# Patient Record
Sex: Female | Born: 1970 | Race: White | Hispanic: No | Marital: Married | Smoking: Never smoker
Health system: Southern US, Community
[De-identification: ages and names within clinical notes are randomized; demographics above are authoritative.]

## PROBLEM LIST (undated history)

## (undated) DIAGNOSIS — F329 Major depressive disorder, single episode, unspecified: Secondary | ICD-10-CM

## (undated) DIAGNOSIS — R519 Headache, unspecified: Secondary | ICD-10-CM

## (undated) DIAGNOSIS — Z87442 Personal history of urinary calculi: Secondary | ICD-10-CM

## (undated) DIAGNOSIS — K0889 Other specified disorders of teeth and supporting structures: Secondary | ICD-10-CM

## (undated) DIAGNOSIS — B029 Zoster without complications: Secondary | ICD-10-CM

## (undated) DIAGNOSIS — S4990XA Unspecified injury of shoulder and upper arm, unspecified arm, initial encounter: Secondary | ICD-10-CM

## (undated) DIAGNOSIS — Z9889 Other specified postprocedural states: Secondary | ICD-10-CM

## (undated) DIAGNOSIS — E039 Hypothyroidism, unspecified: Secondary | ICD-10-CM

## (undated) DIAGNOSIS — A4902 Methicillin resistant Staphylococcus aureus infection, unspecified site: Secondary | ICD-10-CM

## (undated) DIAGNOSIS — K859 Acute pancreatitis without necrosis or infection, unspecified: Secondary | ICD-10-CM

## (undated) DIAGNOSIS — I2699 Other pulmonary embolism without acute cor pulmonale: Secondary | ICD-10-CM

## (undated) DIAGNOSIS — G2581 Restless legs syndrome: Secondary | ICD-10-CM

## (undated) DIAGNOSIS — F439 Reaction to severe stress, unspecified: Secondary | ICD-10-CM

## (undated) DIAGNOSIS — D6851 Activated protein C resistance: Secondary | ICD-10-CM

## (undated) DIAGNOSIS — F32A Depression, unspecified: Secondary | ICD-10-CM

## (undated) DIAGNOSIS — G8929 Other chronic pain: Secondary | ICD-10-CM

## (undated) DIAGNOSIS — F419 Anxiety disorder, unspecified: Secondary | ICD-10-CM

## (undated) DIAGNOSIS — R112 Nausea with vomiting, unspecified: Secondary | ICD-10-CM

## (undated) DIAGNOSIS — R5383 Other fatigue: Secondary | ICD-10-CM

## (undated) DIAGNOSIS — R51 Headache: Secondary | ICD-10-CM

## (undated) DIAGNOSIS — R635 Abnormal weight gain: Secondary | ICD-10-CM

## (undated) HISTORY — DX: Major depressive disorder, single episode, unspecified: F32.9

## (undated) HISTORY — DX: Other fatigue: R53.83

## (undated) HISTORY — DX: Headache, unspecified: R51.9

## (undated) HISTORY — DX: Activated protein C resistance: D68.51

## (undated) HISTORY — DX: Other chronic pain: G89.29

## (undated) HISTORY — DX: Zoster without complications: B02.9

## (undated) HISTORY — DX: Unspecified injury of shoulder and upper arm, unspecified arm, initial encounter: S49.90XA

## (undated) HISTORY — DX: Hypothyroidism, unspecified: E03.9

## (undated) HISTORY — DX: Restless legs syndrome: G25.81

## (undated) HISTORY — DX: Other specified disorders of teeth and supporting structures: K08.89

## (undated) HISTORY — DX: Other pulmonary embolism without acute cor pulmonale: I26.99

## (undated) HISTORY — DX: Depression, unspecified: F32.A

## (undated) HISTORY — DX: Headache: R51

## (undated) HISTORY — PX: OTHER SURGICAL HISTORY: SHX169

## (undated) HISTORY — DX: Reaction to severe stress, unspecified: F43.9

## (undated) HISTORY — DX: Methicillin resistant Staphylococcus aureus infection, unspecified site: A49.02

## (undated) HISTORY — DX: Abnormal weight gain: R63.5

---

## 2001-12-15 ENCOUNTER — Other Ambulatory Visit: Admission: RE | Admit: 2001-12-15 | Discharge: 2001-12-15 | Payer: Self-pay | Admitting: Obstetrics and Gynecology

## 2004-03-25 ENCOUNTER — Ambulatory Visit (HOSPITAL_COMMUNITY): Admission: RE | Admit: 2004-03-25 | Discharge: 2004-03-25 | Payer: Self-pay | Admitting: Internal Medicine

## 2004-03-25 HISTORY — PX: ESOPHAGOGASTRODUODENOSCOPY: SHX1529

## 2004-03-25 HISTORY — PX: COLONOSCOPY: SHX174

## 2004-03-26 ENCOUNTER — Ambulatory Visit (HOSPITAL_COMMUNITY): Admission: RE | Admit: 2004-03-26 | Discharge: 2004-03-26 | Payer: Self-pay | Admitting: Internal Medicine

## 2004-04-16 ENCOUNTER — Ambulatory Visit (HOSPITAL_COMMUNITY): Admission: RE | Admit: 2004-04-16 | Discharge: 2004-04-16 | Payer: Self-pay | Admitting: Internal Medicine

## 2006-11-01 HISTORY — PX: LITHOTRIPSY: SUR834

## 2007-05-02 ENCOUNTER — Ambulatory Visit (HOSPITAL_COMMUNITY): Admission: RE | Admit: 2007-05-02 | Discharge: 2007-05-02 | Payer: Self-pay | Admitting: Obstetrics & Gynecology

## 2007-07-27 ENCOUNTER — Ambulatory Visit (HOSPITAL_COMMUNITY): Admission: RE | Admit: 2007-07-27 | Discharge: 2007-07-27 | Payer: Self-pay | Admitting: Urology

## 2008-05-08 ENCOUNTER — Other Ambulatory Visit: Admission: RE | Admit: 2008-05-08 | Discharge: 2008-05-08 | Payer: Self-pay | Admitting: Obstetrics and Gynecology

## 2009-06-04 ENCOUNTER — Other Ambulatory Visit: Admission: RE | Admit: 2009-06-04 | Discharge: 2009-06-04 | Payer: Self-pay | Admitting: Obstetrics and Gynecology

## 2010-09-16 ENCOUNTER — Other Ambulatory Visit: Admission: RE | Admit: 2010-09-16 | Discharge: 2010-09-16 | Payer: Self-pay | Admitting: Obstetrics and Gynecology

## 2011-03-19 NOTE — Op Note (Signed)
NAME:  Judy Patton, Judy Patton                            ACCOUNT NO.:  [id number]   MEDICAL RECORD NO.:  [id number]                   PATIENT TYPE:  AMB   LOCATION:  DAY                                  FACILITY:  [facility]   PHYSICIAN:  R. Roetta Sessions, M.D.              DATE OF BIRTH:  Mar 01, 1971   DATE OF PROCEDURE:  03/25/2004  DATE OF DISCHARGE:                                 OPERATIVE REPORT   PROCEDURES:  Diagnostic EGD followed by colonoscopy and ileoscopy.   INDICATIONS FOR PROCEDURE:  The patient is a 40 year old lady, 3 month  history of persistent epigastric pain, nausea, intermittent dry heaves,  postprandial component more prominent.  Modest relief with a proton pump  inhibitor therapy.  Intermittent low-volume hematochezia, Hemoccult  positive.  EGD and colonoscopy now being done.  The approach has been  discussed at length.  Potential risks, benefits, and alternatives have been  reviewed, questions answered.  She is agreeable.  Please see the  documentation in medical record.   PROCEDURE NOTE:  O2 saturations, blood pressure, pulse, respirations were  monitored throughout the entire procedure.  Conscious sedation of Versed 5  mg IV, Demerol 255 mg IV in divided doses.   INSTRUMENT:  Olympus video chip system.   FINDINGS:   EGD:  Examination of the tubular esophagus revealed no mucosal abnormality.  EG junction was easily traversed.   STOMACH:  The gastric cavity was emptied, insufflated well with air.  Thorough examination of the gastric mucosa, including retroflexed view of  the proximal stomach and esophagogastric junction demonstrated no  abnormalities aside from a small hiatal hernia.  Pylorus was patent and  easily traversed.  Examination of the bulb and second portion revealed no  abnormalities.   THERAPEUTIC/DIAGNOSTIC MANEUVERS PERFORMED:  None.   The patient tolerated the procedure well and was prepared for colonoscopy.  Digital rectal exam revealed no  abnormalities or endoscopic findings.  The  prep was good.   RECTUM:  Examination of the rectal mucosa including retroflexed view in the  anal verge and __________ anal canal demonstrated minimal internal  hemorrhoids.  Otherwise, rectal mucosa appeared normal.   COLON:  Colonic mucosa was surveyed from the rectosigmoid junction through  the left transverse and right colon to the area of the appendiceal orifice,  ileocecal valve, and cecum.  These structures were well-seen and  photographed for the record.  The terminal ileum was intubated to 10 cm.  From this level, the scope was slowly withdrawn.  All previously mentioned  mucosal surfaces were again seen.  The colonic mucosa as well as the  terminal ileum mucosal appeared entirely normal.  The patient tolerated the  procedures well, was reacted in endoscopy.   IMPRESSION:  1. EGD:  Normal esophagus, small hiatal hernia, otherwise normal stomach,     D1, D2.  2. Colonoscopy findings:  Anal canal hemorrhoids, otherwise normal rectum,  colon, terminal ileum.   I suspect the patient has had benign anorectal bleeding secondary to  hemorrhoids.  The etiology of her abdominal pain not yet well-defined.  From  the office on Mar 03, 2004, CBC, sed rate, and liver profile came back  normal.   RECOMMENDATIONS:  1. We will check a serum amylase today, provide Ms. Fiorini with hemorrhoid     literature.  2. We will proceed with a gallbladder work-up in the way of right upper     quadrant ultrasound.  3. Further recommendations to follow.      ___________________________________________                                            Jonathon Bellows, M.D.   RMR/MEDQ  D:  03/25/2004  T:  03/25/2004  Job:  [id number]   cc:   Tilda Burrow, M.D.  [address] [place]  [place] [address]  Fax: [phone]
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
## 2011-08-12 LAB — URINALYSIS, ROUTINE W REFLEX MICROSCOPIC
Bilirubin Urine: NEGATIVE
Glucose, UA: NEGATIVE
Ketones, ur: NEGATIVE
Leukocytes, UA: NEGATIVE
Nitrite: NEGATIVE
Protein, ur: NEGATIVE

## 2011-08-12 LAB — CBC
MCV: 91.3
Platelets: 234
RBC: 4.2
RDW: 12.9

## 2011-08-12 LAB — BASIC METABOLIC PANEL
BUN: 6
CO2: 26
Calcium: 9
Chloride: 104
Glucose, Bld: 80
Potassium: 3.7

## 2011-08-12 LAB — URINE MICROSCOPIC-ADD ON

## 2011-09-20 ENCOUNTER — Other Ambulatory Visit (HOSPITAL_COMMUNITY)
Admission: RE | Admit: 2011-09-20 | Discharge: 2011-09-20 | Disposition: A | Discharge status: Home / Self Care | Payer: BC Managed Care – PPO | Source: Ambulatory Visit | Attending: Obstetrics and Gynecology | Admitting: Obstetrics and Gynecology

## 2011-09-20 DIAGNOSIS — Z01419 Encounter for gynecological examination (general) (routine) without abnormal findings: Secondary | ICD-10-CM | POA: Insufficient documentation

## 2012-04-19 ENCOUNTER — Other Ambulatory Visit: Payer: Self-pay | Admitting: Obstetrics and Gynecology

## 2012-04-19 DIAGNOSIS — Z139 Encounter for screening, unspecified: Secondary | ICD-10-CM

## 2012-04-25 ENCOUNTER — Ambulatory Visit (HOSPITAL_COMMUNITY)
Admission: RE | Admit: 2012-04-25 | Discharge: 2012-04-25 | Disposition: A | Discharge status: Home / Self Care | Payer: BC Managed Care – PPO | Source: Ambulatory Visit | Attending: Obstetrics and Gynecology | Admitting: Obstetrics and Gynecology

## 2012-04-25 DIAGNOSIS — Z139 Encounter for screening, unspecified: Secondary | ICD-10-CM

## 2012-04-25 DIAGNOSIS — Z1231 Encounter for screening mammogram for malignant neoplasm of breast: Secondary | ICD-10-CM | POA: Insufficient documentation

## 2012-04-27 ENCOUNTER — Other Ambulatory Visit: Payer: Self-pay | Admitting: Obstetrics and Gynecology

## 2012-04-27 DIAGNOSIS — R928 Other abnormal and inconclusive findings on diagnostic imaging of breast: Secondary | ICD-10-CM

## 2012-05-10 ENCOUNTER — Ambulatory Visit (HOSPITAL_COMMUNITY)
Admission: RE | Admit: 2012-05-10 | Discharge: 2012-05-10 | Disposition: A | Discharge status: Home / Self Care | Payer: BC Managed Care – PPO | Source: Ambulatory Visit | Attending: Obstetrics and Gynecology | Admitting: Obstetrics and Gynecology

## 2012-05-10 DIAGNOSIS — R928 Other abnormal and inconclusive findings on diagnostic imaging of breast: Secondary | ICD-10-CM | POA: Insufficient documentation

## 2012-12-01 ENCOUNTER — Other Ambulatory Visit: Payer: Self-pay | Admitting: Adult Health

## 2012-12-01 ENCOUNTER — Other Ambulatory Visit (HOSPITAL_COMMUNITY)
Admission: RE | Admit: 2012-12-01 | Discharge: 2012-12-01 | Disposition: A | Discharge status: Home / Self Care | Payer: BC Managed Care – PPO | Source: Ambulatory Visit | Attending: Obstetrics and Gynecology | Admitting: Obstetrics and Gynecology

## 2012-12-01 DIAGNOSIS — Z1151 Encounter for screening for human papillomavirus (HPV): Secondary | ICD-10-CM | POA: Insufficient documentation

## 2012-12-01 DIAGNOSIS — Z01419 Encounter for gynecological examination (general) (routine) without abnormal findings: Secondary | ICD-10-CM | POA: Insufficient documentation

## 2012-12-28 ENCOUNTER — Encounter: Payer: Self-pay | Admitting: Internal Medicine

## 2013-01-01 ENCOUNTER — Ambulatory Visit: Payer: BC Managed Care – PPO | Admitting: Gastroenterology

## 2013-01-30 ENCOUNTER — Encounter: Payer: Self-pay | Admitting: *Deleted

## 2013-01-31 ENCOUNTER — Other Ambulatory Visit: Payer: BC Managed Care – PPO

## 2013-01-31 DIAGNOSIS — E039 Hypothyroidism, unspecified: Secondary | ICD-10-CM

## 2013-02-06 ENCOUNTER — Telehealth: Payer: Self-pay | Admitting: Adult Health

## 2013-02-06 NOTE — Telephone Encounter (Signed)
Left message, TSH 1.004 which is normal.

## 2013-04-03 ENCOUNTER — Other Ambulatory Visit: Payer: Self-pay | Admitting: Adult Health

## 2013-05-30 ENCOUNTER — Telehealth: Payer: Self-pay | Admitting: Adult Health

## 2013-05-31 NOTE — Telephone Encounter (Signed)
She says her RLS is bother her in the day now,is taking 1 mg at hs can take 1 an 1/2 for several days and let me know how it is working can increase more if needed.

## 2013-06-13 ENCOUNTER — Telehealth: Payer: Self-pay | Admitting: *Deleted

## 2013-06-13 MED ORDER — ROPINIROLE HCL 2 MG PO TABS: 2.0000 mg | ORAL_TABLET | Freq: Every day | ORAL | Status: DC

## 2013-06-13 NOTE — Telephone Encounter (Signed)
Has tried 1.5 mg of requip helped some will increase to 2 mg

## 2013-07-06 ENCOUNTER — Other Ambulatory Visit: Payer: Self-pay | Admitting: Adult Health

## 2013-07-09 ENCOUNTER — Other Ambulatory Visit: Payer: Self-pay | Admitting: *Deleted

## 2013-07-10 ENCOUNTER — Other Ambulatory Visit: Payer: Self-pay | Admitting: *Deleted

## 2013-07-10 MED ORDER — LEVOTHYROXINE SODIUM 88 MCG PO TABS: ORAL_TABLET | ORAL | Status: DC

## 2013-07-23 ENCOUNTER — Encounter: Payer: BC Managed Care – PPO | Admitting: Family Medicine

## 2013-07-24 ENCOUNTER — Encounter: Payer: Self-pay | Admitting: Family Medicine

## 2013-07-24 ENCOUNTER — Ambulatory Visit (INDEPENDENT_AMBULATORY_CARE_PROVIDER_SITE_OTHER): Payer: BC Managed Care – PPO | Admitting: Family Medicine

## 2013-07-24 VITALS — BP 122/80 | Temp 98.4°F | Wt 189.2 lb

## 2013-07-24 DIAGNOSIS — R51 Headache: Secondary | ICD-10-CM

## 2013-07-24 DIAGNOSIS — G2581 Restless legs syndrome: Secondary | ICD-10-CM

## 2013-07-24 LAB — CBC WITH DIFFERENTIAL/PLATELET
Basophils Absolute: 0 10*3/uL (ref 0.0–0.1)
Basophils Relative: 0 % (ref 0–1)
Eosinophils Relative: 2 % (ref 0–5)
HCT: 38.6 % (ref 36.0–46.0)
Hemoglobin: 13.2 g/dL (ref 12.0–15.0)
MCH: 30.3 pg (ref 26.0–34.0)
MCHC: 34.2 g/dL (ref 30.0–36.0)
MCV: 88.5 fL (ref 78.0–100.0)
Monocytes Absolute: 0.8 10*3/uL (ref 0.1–1.0)
Monocytes Relative: 7 % (ref 3–12)
Neutro Abs: 8.4 10*3/uL — ABNORMAL HIGH (ref 1.7–7.7)
Platelets: 331 10*3/uL (ref 150–400)

## 2013-07-24 LAB — COMPREHENSIVE METABOLIC PANEL
ALT: 10 U/L (ref 0–35)
AST: 14 U/L (ref 0–37)
Albumin: 3.4 g/dL — ABNORMAL LOW (ref 3.5–5.2)
BUN: 8 mg/dL (ref 6–23)
Glucose, Bld: 85 mg/dL (ref 70–99)
Total Bilirubin: 0.3 mg/dL (ref 0.3–1.2)

## 2013-07-24 MED ORDER — BUTALBITAL-APAP-CAFFEINE 50-325-40 MG PO TABS: ORAL_TABLET | ORAL | Status: DC

## 2013-07-24 NOTE — Progress Notes (Signed)
  Subjective:    Patient ID: Judy Patton, female    DOB: Aug 09, 1971, 42 y.o.   MRN: [id number]  HPI Pt here to discuss 2 new problems.  RLS - started 2-3 years ago, intermittant, started in car rides, over time has been regular nights and woke her up. Walking around used to help but now it does not. Has been feeling worse and staring earlier in the evening. On requip currently, 2mg  per day. Is on celexa. Caffeien 2 cups per day.  Headaches - past few mos, seemed to be associated with period so changed ocp couple years ago and headaches improved. Sometimes blurry vision, nausea, no vomiting, sometimes hurts even to sleep. 1-2 per year. 600mg  advil takes the edge off. 2 aleve has same effect. Has not tried anything else. Drinks minimal water during the day - 4-6oz per day. Usually bitemporal. Mom has a h/o migraines.   Review of Systems per hpi     Objective:   Physical Exam  Nursing note and vitals reviewed. Constitutional: She is oriented to person, place, and time. She appears well-developed and well-nourished.  HENT:  Right Ear: External ear normal.  Left Ear: External ear normal.  Nose: Nose normal.  Mouth/Throat: Oropharynx is clear and moist. No oropharyngeal exudate.  Eyes: Conjunctivae are normal. Pupils are equal, round, and reactive to light.  Neck: Normal range of motion. Neck supple. No thyromegaly present.  Cardiovascular: Normal rate, regular rhythm and normal heart sounds.   Pulmonary/Chest: Effort normal and breath sounds normal.  Abdominal: Soft. Bowel sounds are normal. She exhibits no distension. There is no tenderness. There is no rebound.  Lymphadenopathy:    She has no cervical adenopathy.  Neurological: She is alert and oriented to person, place, and time. She has normal reflexes.  Skin: Skin is warm and dry.  Mole on central back  Psychiatric: She has a normal mood and affect. Her behavior is normal.          Assessment & Plan:  Headache(784.0) - Plan:  butalbital-acetaminophen-caffeine (FIORICET) 50-325-40 MG per tablet, CBC with Differential, Comprehensive metabolic panel, Hemoglobin A1c, Vitamin B12, Vit D  25 hydroxy (rtn osteoporosis monitoring), B. Burgdorfi Antibodies, TSH, POCT urinalysis dipstick  Restless leg syndrome - Plan: CBC with Differential, Ferritin   HA - orders as above and per avs, if no better by f/u in 2 weeks w exercise, hydration, will obtain imaging  RLS - cont requip, checking ferritin  Mole - irritates her, catches on her bra and sometimes bleeds from this. Will remove at f/u visit.   Will do Hmaint at f/u

## 2013-07-24 NOTE — Patient Instructions (Signed)
*   Increase hydration to 64 oz per day * Limit caffeine to 1 cup per day * Get some exercise, at least 20 minutes 3 times weekly  Restless Legs Syndrome Restless legs syndrome is a movement disorder. It may also be called a sensori-motor disorder.  CAUSES  No one knows what specifically causes restless legs syndrome, but it tends to run in families. It is also more common in people with low iron, in pregnancy, in people who need dialysis, and those with nerve damage (neuropathy).Some medications may make restless legs syndrome worse.Those medications include drugs to treat high blood pressure, some heart conditions, nausea, colds, allergies, and depression. SYMPTOMS Symptoms include uncomfortable sensations in the legs. These leg sensations are worse during periods of inactivity or rest. They are also worse while sitting or lying down. Individuals that have the disorder describe sensations in the legs that feel like:  Pulling.  Drawing.  Crawling.  Worming.  Boring.  Tingling.  Pins and needles.  Prickling.  Pain. The sensations are usually accompanied by an overwhelming urge to move the legs. Sudden muscle jerks may also occur. Movement provides temporary relief from the discomfort. In rare cases, the arms may also be affected. Symptoms may interfere with going to sleep (sleep onset insomnia). Restless legs syndrome may also be related to periodic limb movement disorder (PLMD). PLMD is another more common motor disorder. It also causes interrupted sleep. The symptoms from PLMD usually occur most often when you are awake. TREATMENT  Treatment for restless legs syndrome is symptomatic. This means that the symptoms are treated.   Massage and cold compresses may provide temporary relief.  Walk, stretch, or take a cold or hot bath.  Get regular exercise and a good night's sleep.  Avoid caffeine, alcohol, nicotine, and medications that can make it worse.  Do activities that  provide mental stimulation like discussions, needlework, and video games. These may be helpful if you are not able to walk or stretch. Some medications are effective in relieving the symptoms. However, many of these medications have side effects. Ask your caregiver about medications that may help your symptoms. Correcting iron deficiency may improve symptoms for some patients. Document Released: 10/08/2002 Document Revised: 01/10/2012 Document Reviewed: 01/14/2011 [facility] Patient Information 2014 [place], [place].

## 2013-07-25 LAB — VITAMIN D 25 HYDROXY (VIT D DEFICIENCY, FRACTURES): Vit D, 25-Hydroxy: 43 ng/mL (ref 30–89)

## 2013-08-01 ENCOUNTER — Telehealth: Payer: Self-pay | Admitting: *Deleted

## 2013-08-01 NOTE — Telephone Encounter (Signed)
Pt called questioning lab results. Will route to MD.

## 2013-08-02 ENCOUNTER — Other Ambulatory Visit: Payer: Self-pay | Admitting: Family Medicine

## 2013-08-02 ENCOUNTER — Telehealth: Payer: Self-pay | Admitting: *Deleted

## 2013-08-02 DIAGNOSIS — G2581 Restless legs syndrome: Secondary | ICD-10-CM

## 2013-08-02 DIAGNOSIS — M25473 Effusion, unspecified ankle: Secondary | ICD-10-CM

## 2013-08-02 DIAGNOSIS — R51 Headache: Secondary | ICD-10-CM

## 2013-08-02 NOTE — Telephone Encounter (Signed)
Noted after telephone encounter was sent to MD. Do I still need to call pt?

## 2013-08-02 NOTE — Telephone Encounter (Signed)
Pt called and left VM stating that she received a call regarding her lab work and she was returning call. Will route to MD.

## 2013-08-02 NOTE — Telephone Encounter (Signed)
Pt returned your call.  She wanted to discuss the lab work she had done.  Please call her

## 2013-08-02 NOTE — Telephone Encounter (Signed)
Spoke with patient. Discussed labs. Have placed future labs that she will get drawn at her convenience at the OP lab. Will f/u at her cpe appt, earlier if needed.

## 2013-08-02 NOTE — Telephone Encounter (Signed)
Called pt back and LMOM asking her to return call.  When she calls back: Labs in general look fine although white count slightly up as well as one of the percentages. We should repeat this to ensure that there is no hidden problem - I expect either stability or improvement of those numbers but want to make sure. At the same time, we can get her ferritin, as  It was put in as lab collect instead of clinic collect so we dont have the result (my error). We can do these labs when she comes in for her CPE or i can place a future lab and she can get it done at her convenience before then - either is fine, just let me know. Thanks AW

## 2013-08-07 ENCOUNTER — Other Ambulatory Visit: Payer: Self-pay | Admitting: *Deleted

## 2013-08-07 DIAGNOSIS — G2581 Restless legs syndrome: Secondary | ICD-10-CM

## 2013-08-07 DIAGNOSIS — M25473 Effusion, unspecified ankle: Secondary | ICD-10-CM

## 2013-08-07 DIAGNOSIS — R51 Headache: Secondary | ICD-10-CM

## 2013-08-08 ENCOUNTER — Telehealth: Payer: Self-pay | Admitting: *Deleted

## 2013-08-08 LAB — CBC WITH DIFFERENTIAL/PLATELET
Basophils Absolute: 0.1 10*3/uL (ref 0.0–0.1)
Basophils Relative: 1 % (ref 0–1)
HCT: 37.9 % (ref 36.0–46.0)
Hemoglobin: 12.6 g/dL (ref 12.0–15.0)
Lymphocytes Relative: 27 % (ref 12–46)
Lymphs Abs: 2.9 10*3/uL (ref 0.7–4.0)
Monocytes Absolute: 1 10*3/uL (ref 0.1–1.0)
Neutro Abs: 6.7 10*3/uL (ref 1.7–7.7)
RBC: 4.27 MIL/uL (ref 3.87–5.11)
RDW: 13.5 % (ref 11.5–15.5)
WBC: 11 10*3/uL — ABNORMAL HIGH (ref 4.0–10.5)

## 2013-08-08 LAB — FERRITIN: Ferritin: 81 ng/mL (ref 10–291)

## 2013-08-08 LAB — B. BURGDORFI ANTIBODIES: B burgdorferi Ab IgG+IgM: 0.49 {ISR}

## 2013-08-08 NOTE — Telephone Encounter (Signed)
Pt notified and appreciative.

## 2013-08-08 NOTE — Telephone Encounter (Signed)
Message copied by [facility] [facility] on Wed Aug 08, 2013  1:19 PM ------      Message from: Acey Lav      Created: Wed Aug 08, 2013  1:10 PM       Please let pt know i have received her labs back.       Her ferritin is low, which probably is making her rls worse. She should start taking an iron supplement 1-2 times daily. I suggest ferrous gluconate as it is easier on the stomach, without food if possible and with a source of vitamin C such as orange juice.       Her white blood cell count is trending toward normal - this is reassuring.       Lyme titers normal.       I'll se her at her appt next week.       Thanks AW. ------

## 2013-08-13 ENCOUNTER — Other Ambulatory Visit: Payer: Self-pay | Admitting: Family Medicine

## 2013-08-13 ENCOUNTER — Ambulatory Visit (INDEPENDENT_AMBULATORY_CARE_PROVIDER_SITE_OTHER): Payer: BC Managed Care – PPO | Admitting: Family Medicine

## 2013-08-13 VITALS — BP 130/78 | Temp 98.2°F | Ht 65.0 in | Wt 190.0 lb

## 2013-08-13 DIAGNOSIS — D239 Other benign neoplasm of skin, unspecified: Secondary | ICD-10-CM

## 2013-08-13 DIAGNOSIS — Z0000 Encounter for general adult medical examination without abnormal findings: Secondary | ICD-10-CM

## 2013-08-13 DIAGNOSIS — Z23 Encounter for immunization: Secondary | ICD-10-CM

## 2013-08-13 DIAGNOSIS — R609 Edema, unspecified: Secondary | ICD-10-CM

## 2013-08-13 DIAGNOSIS — D229 Melanocytic nevi, unspecified: Secondary | ICD-10-CM

## 2013-08-13 DIAGNOSIS — G2581 Restless legs syndrome: Secondary | ICD-10-CM

## 2013-08-13 LAB — POCT URINALYSIS DIPSTICK
Ketones, UA: NEGATIVE
Protein, UA: NEGATIVE
Spec Grav, UA: >=1.03
Urobilinogen, UA: 0.2

## 2013-08-13 NOTE — Progress Notes (Signed)
Subjective:    Patient ID: Judy Patton, female    DOB: 1971-06-21, 42 y.o.   MRN: [id number]  HPI  Judy Patton is here today for CPE and also to follow up on 3 chronic medical conditions: headaches, edema, and RLS. Additionally she would like a mole on her back removed, as it gets irritated by her brastrap and has bled a couple times.   # Health maint - pap - 11/2012, never abn, at gyn - mammo/pert FH - due in January, never abn - physician breast exam - jan 2014, OB/gyn - PSA and DRE- n/a - c-scope/pert FH - had 3 years ago, was wnl, next one at age 48 - dexa - still gets periods, no steroid use - BP screen - 130/78 - cholesterol screen - needs to be checked - fasting glucose screen - needs to be checked no strong FH, a1c 5.5 last month - chlamydia screen (if female under age 17) - n/a - tobacco - never smoker - alcohol - one glass wine per month - drugs - none ever - Hep C (born 50-1965) - n/a, no high risk - tdap (once over age 19 in place of DT) - has not had tdap - DT (every 10 years until age 56, once age 22+) - td in 14 - flu - would like mist - pneumovax - does not need yet - zostavax (if over 60) - not yet - vit D - wnl last month, drinks 1 glass milk per day and does not get much sun exposure  Headaches - improving on increased hydration. 2 "bad ones" since last visit but overall she feels they are getting better. Fioricet helps when gets bad headache. No red flag sx such as being woken up by them.   RLS - ferritin 89, ha snot started taking iron yet. On requip.   Edema - b/l LE edema, worse when spends day standing. Had slightly low albumin on cmp but says she does eat protein daily. Also c/o mild shob when asked, but thinks may be due to lack of conditioning. No cardiac hx.   Mole - has been there for severla years. OB has "watched it" but as it is at the leve of brastrap it gets irritated easily. To her knowledge it has not changed. It does not itch.   Review of  Systems Per hpi    Objective:   Physical Exam Nursing note and vitals reviewed. Constitutional: She is oriented to person, place, and time. She appears well-developed and well-nourished.  HENT:  Right Ear: External ear normal.  Left Ear: External ear normal.  Nose: Nose normal.  Mouth/Throat: Oropharynx is clear and moist. No oropharyngeal exudate.  Eyes: Conjunctivae are normal. Pupils are equal, round, and reactive to light.  Neck: Normal range of motion. Neck supple. No thyromegaly present.  Cardiovascular: Normal rate, regular rhythm and normal heart sounds.   Pulmonary/Chest: Effort normal and breath sounds normal.  Abdominal: Soft. Bowel sounds are normal. She exhibits no distension. There is no tenderness. There is no rebound.  Lymphadenopathy:    She has no cervical adenopathy.  Neurological: She is alert and oriented to person, place, and time. She has normal reflexes.  Skin: Skin is warm and dry. Mole on back 65mmx5mm, rasied and hyperpigmented. Psychiatric: She has a normal mood and affect. Her behavior is normal.  Ext - currently no edema       Assessment & Plan:   HM - future lab placed for FLP and  fasting glucose. Will also check tsh. OB has been managing TSH but may be easier for [place] to handle that for her. Tdap and flu mist today. cpe in 1 year.   Headaches - continue with current mgmt. Discussed that if HAs are not continuing to improve, she should let me know so we can obtain imaging/further studies.   RLS - she will start taking ferrous gluconate twice daily. i suspect this will help her sx. Will recheck ferritin in a couple months. Meanwhile, requip as needed.   Edema - will check urine for protein although prior UA has been fine. Combination of edema and shob is concerning so will check echo. Pt relays that mom has heart issues and thinks it is chf.   Will plant to see her back in 3 mos for chronic issues, earlier if needed.   Mole: Shave Biopsy Procedure  Note  Pre-operative Diagnosis: Suspicious lesion  Post-operative Diagnosis: same  Locations:medial back  Indications: irritation  Anesthesia: Lidocaine 2% with epinephrine with added sodium bicarbonate  Procedure Details  History of allergy to iodine: no  Patient informed of the risks (including bleeding and infection) and benefits of the  procedure and Verbal informed consent obtained.  The lesion and surrounding area were given a sterile prep using betadyne and alcohol and draped in the usual sterile fashion. A scalpel was used to shave an area of skin approximately 1cm by 1cm.  Hemostasis achieved with pressure for hemostasis. Antibiotic ointment and a sterile dressing applied.  The specimen was sent for pathologic examination. The patient tolerated the procedure well.  EBL: <1 ml  Findings: Suspect benign nevus  Condition: Stable  Complications: none.  Plan: 1. Instructed to keep the wound dry and covered for 24-48h and clean thereafter. 2. Warning signs of infection were reviewed.   3. Recommended that the patient use OTC acetaminophen as needed for pain.  4. Return in as needed time. I'll f/u path report and let her know result when I have it.

## 2013-08-13 NOTE — Patient Instructions (Signed)
Restless Legs Syndrome Restless legs syndrome is a movement disorder. It may also be called a sensori-motor disorder.  CAUSES  No one knows what specifically causes restless legs syndrome, but it tends to run in families. It is also more common in people with low iron, in pregnancy, in people who need dialysis, and those with nerve damage (neuropathy).Some medications may make restless legs syndrome worse.Those medications include drugs to treat high blood pressure, some heart conditions, nausea, colds, allergies, and depression. SYMPTOMS Symptoms include uncomfortable sensations in the legs. These leg sensations are worse during periods of inactivity or rest. They are also worse while sitting or lying down. Individuals that have the disorder describe sensations in the legs that feel like:  Pulling.  Drawing.  Crawling.  Worming.  Boring.  Tingling.  Pins and needles.  Prickling.  Pain. The sensations are usually accompanied by an overwhelming urge to move the legs. Sudden muscle jerks may also occur. Movement provides temporary relief from the discomfort. In rare cases, the arms may also be affected. Symptoms may interfere with going to sleep (sleep onset insomnia). Restless legs syndrome may also be related to periodic limb movement disorder (PLMD). PLMD is another more common motor disorder. It also causes interrupted sleep. The symptoms from PLMD usually occur most often when you are awake. TREATMENT  Treatment for restless legs syndrome is symptomatic. This means that the symptoms are treated.   Massage and cold compresses may provide temporary relief.  Walk, stretch, or take a cold or hot bath.  Get regular exercise and a good night's sleep.  Avoid caffeine, alcohol, nicotine, and medications that can make it worse.  Do activities that provide mental stimulation like discussions, needlework, and video games. These may be helpful if you are not able to walk or  stretch. Some medications are effective in relieving the symptoms. However, many of these medications have side effects. Ask your caregiver about medications that may help your symptoms. Correcting iron deficiency may improve symptoms for some patients. Document Released: 10/08/2002 Document Revised: 01/10/2012 Document Reviewed: 01/14/2011 ExitCare Patient Information 2014 [employer].  

## 2013-08-14 ENCOUNTER — Encounter: Payer: Self-pay | Admitting: Family Medicine

## 2013-08-14 ENCOUNTER — Telehealth: Payer: Self-pay | Admitting: Family Medicine

## 2013-08-14 NOTE — Telephone Encounter (Signed)
Patient made aware of

## 2013-08-14 NOTE — Telephone Encounter (Signed)
Please let pt know that her urine yesterday had no protein, which is good. As we discussed, I have placed an order for an echocardiogram. I'll let her know when i get the results.

## 2013-08-15 LAB — URINE CULTURE: Colony Count: NO GROWTH

## 2013-08-20 ENCOUNTER — Telehealth: Payer: Self-pay | Admitting: Family Medicine

## 2013-08-20 NOTE — Telephone Encounter (Signed)
Please call and let pt know that her pathology report came back and the mole on her back is a benign intradermal nevus. This means that it is a mole that is not dangerous but does effect the full skin thickness. We don't need to do anything else with it at this point but we will continue to observe it over time to make sure there are no concerning changes.

## 2013-08-21 ENCOUNTER — Telehealth: Payer: Self-pay | Admitting: *Deleted

## 2013-08-21 NOTE — Telephone Encounter (Signed)
Patient made aware of report on mole taken off back

## 2013-10-08 ENCOUNTER — Other Ambulatory Visit: Payer: Self-pay | Admitting: Adult Health

## 2013-11-13 ENCOUNTER — Ambulatory Visit (INDEPENDENT_AMBULATORY_CARE_PROVIDER_SITE_OTHER): Payer: BC Managed Care – PPO | Admitting: Family Medicine

## 2013-11-13 ENCOUNTER — Encounter: Payer: Self-pay | Admitting: Family Medicine

## 2013-11-13 VITALS — BP 120/90 | HR 72 | Temp 99.1°F | Resp 18 | Ht 64.0 in | Wt 196.4 lb

## 2013-11-13 DIAGNOSIS — R609 Edema, unspecified: Secondary | ICD-10-CM

## 2013-11-13 DIAGNOSIS — R102 Pelvic and perineal pain: Secondary | ICD-10-CM | POA: Insufficient documentation

## 2013-11-13 DIAGNOSIS — R079 Chest pain, unspecified: Secondary | ICD-10-CM

## 2013-11-13 DIAGNOSIS — J069 Acute upper respiratory infection, unspecified: Secondary | ICD-10-CM | POA: Insufficient documentation

## 2013-11-13 DIAGNOSIS — R51 Headache: Secondary | ICD-10-CM

## 2013-11-13 DIAGNOSIS — R519 Headache, unspecified: Secondary | ICD-10-CM | POA: Insufficient documentation

## 2013-11-13 DIAGNOSIS — G2581 Restless legs syndrome: Secondary | ICD-10-CM | POA: Insufficient documentation

## 2013-11-13 LAB — POCT URINALYSIS DIPSTICK
BILIRUBIN UA: NEGATIVE
Glucose, UA: NEGATIVE
Ketones, UA: NEGATIVE
Leukocytes, UA: NEGATIVE
NITRITE UA: NEGATIVE
PH UA: 6
Spec Grav, UA: >=1.03
Urobilinogen, UA: NEGATIVE

## 2013-11-13 NOTE — Patient Instructions (Signed)
Use ferrous gluconate rather than ferrous sulfate. Take it, if possible, twice daily with only vitamin C (tablet, chew, or orange juice)  Labs today  Get your echocardiogram done  Go to the ED if chest pain recurs.

## 2013-11-13 NOTE — Progress Notes (Signed)
Subjective:    Patient ID: Judy Patton, female    DOB: 1970-12-01, 43 y.o.   MRN: [id number]  HPI Patient is here today in followup ear  Restless leg syndrome seems to be improved some on the iron. She has not yet gotten her followup ferritin. Will draw this today. She says to restless leg symptoms were better when she was taking iron twice a day but this gave her a stomachache and so now she's taking it once a day. She isn't sure if she was taking ferrous sulfate or ferrous gluconate.  Headaches have improved with increase in water. She is a dull headache today that she attributes to have an upper respiratory infection.  She complains of dull suprapubic and pelvic pain radiating around to her back today. Has been on for about 2 days and is waxing and waning 4-6/10 in severity. She describes mild dysuria on and off. She does have a history of kidney stones but says this doesn't feel like that pain. Denies any vaginal discharge or dyspareunia.  She also endorses in view left-sided chest pain with left sided arm pain she does not have these pains today but they've been on and off for the last month. She hasn't mentioned it to anybody. She isn't sure if they come on with exercise. Sometimes they happen just at rest. They've been mild to 4/10 in severity. She thinks sometimes the arm pain happens after she slept on it wrong. She is a nonsmoker and we aren't sure how her cholesterol is and she hasn't gotten the test yet. No alarming family history.  Review of Systems A 12 point review of systems is negative except as per hpi.       Objective:   Physical Exam  Nursing note and vitals reviewed. Constitutional: She is oriented to person, place, and time. She appears well-developed and well-nourished.  HENT:  Right Ear: External ear normal.  Left Ear: External ear normal.  Nose: Nose normal. No ttp of sinuses Mouth/Throat: Oropharynx is clear and moist. No oropharyngeal exudate.  Eyes:  Conjunctivae are normal. Pupils are equal, round, and reactive to light.  Neck: Normal range of motion. Neck supple. No thyromegaly present.  Cardiovascular: Normal rate, regular rhythm and normal heart sounds.   Pulmonary/Chest: Effort normal and breath sounds normal.  Abdominal: Soft. Bowel sounds are normal. She exhibits no distension. Suprapubic ttp.. There is no rebound.  Lymphadenopathy:    She has no cervical adenopathy.  Neurological: She is alert and oriented to person, place, and time. She has normal reflexes.  Skin: Skin is warm and dry.  Psychiatric: She has a normal mood and affect. Her behavior is normal.        Assessment & Plan:  Judy Patton was seen today for follow-up and abdominal pain.  Diagnoses and associated orders for this visit:  Pelvic pain - POCT urinalysis dipstick - Urine culture Today he had 2+ blood so we'll follow the culture. If cultures negative and she continues to have pain we'll do a pelvic exam and probably get an ultrasound. Chest pain - EKG 12-Lead - Exercise Tolerance Test; Future His chest pain returns she's been instructed to go the emergency department for evaluation. Meanwhile will do an exercise stress test. EKG in the office looks okay. QTC slightly long at 457.   Restless leg Have asked her to check her bottle and make sure she's taking ferrous gluconate ferrous sulfate. Will recheck her ferritin today. Requip as needed. Reminded her that  ideally she should take when necessary on an empty stomach with some vitamin C twice a day. If this isn't possible she should do is close to this if possible.  Generalized headaches Continue current management. For me know if they get worse.  Edema Edema is actually better today than it was last time she was here.  She will get her echocardiogram for further evaluation as ordered last Acute upper respiratory infections of unspecified site She can increase her Flonase from 1 spray to 2 sprays per  nostril   once daily. I don't think she has a sinus infection at this time and it'll think she needs any antibiotics right now.Osseous her back in 2-4 weeks to followup on the above. As indicated she will go to emergency department if needed especially for the chest pain.

## 2013-11-14 LAB — LIPID PANEL
CHOL/HDL RATIO: 2.4 ratio
Cholesterol: 165 mg/dL (ref 0–200)
HDL: 68 mg/dL (ref >39)
LDL CALC: 67 mg/dL (ref 0–99)
Triglycerides: 148 mg/dL (ref <150)
VLDL: 30 mg/dL (ref 0–40)

## 2013-11-14 LAB — URINE CULTURE: Colony Count: 50000

## 2013-11-14 LAB — TSH: TSH: 3.446 u[IU]/mL (ref 0.350–4.500)

## 2013-11-14 LAB — FERRITIN: Ferritin: 110 ng/mL (ref 10–291)

## 2013-11-15 ENCOUNTER — Telehealth: Payer: Self-pay | Admitting: *Deleted

## 2013-11-15 NOTE — Telephone Encounter (Signed)
Pt notified and appreciative.

## 2013-11-15 NOTE — Progress Notes (Signed)
See telephone encounter.

## 2013-11-15 NOTE — Telephone Encounter (Signed)
Message copied by [facility], Louis Meckel on Thu Nov 15, 2013  2:17 PM ------      Message from: Doran Heater      Created: Thu Nov 15, 2013 10:56 AM       Please let pt know labs wnl thanks AW ------

## 2013-11-16 ENCOUNTER — Ambulatory Visit (HOSPITAL_COMMUNITY)
Admission: RE | Admit: 2013-11-16 | Discharge: 2013-11-16 | Disposition: A | Discharge status: Home / Self Care | Payer: BC Managed Care – PPO | Source: Ambulatory Visit | Attending: Family Medicine | Admitting: Family Medicine

## 2013-11-16 ENCOUNTER — Ambulatory Visit (HOSPITAL_COMMUNITY): Payer: BC Managed Care – PPO

## 2013-11-16 DIAGNOSIS — Z6833 Body mass index (BMI) 33.0-33.9, adult: Secondary | ICD-10-CM | POA: Insufficient documentation

## 2013-11-16 DIAGNOSIS — R609 Edema, unspecified: Secondary | ICD-10-CM

## 2013-11-16 DIAGNOSIS — R079 Chest pain, unspecified: Secondary | ICD-10-CM | POA: Insufficient documentation

## 2013-11-16 NOTE — Progress Notes (Signed)
*  PRELIMINARY RESULTS* Echocardiogram 2D Echocardiogram has been performed.  [place], [place] 11/16/2013, 4:13 PM

## 2013-11-22 ENCOUNTER — Ambulatory Visit (HOSPITAL_COMMUNITY)
Admission: RE | Admit: 2013-11-22 | Discharge: 2013-11-22 | Disposition: A | Discharge status: Home / Self Care | Payer: BC Managed Care – PPO | Source: Ambulatory Visit | Attending: Family Medicine | Admitting: Family Medicine

## 2013-11-22 ENCOUNTER — Encounter: Payer: Self-pay | Admitting: Cardiology

## 2013-11-22 DIAGNOSIS — R079 Chest pain, unspecified: Secondary | ICD-10-CM

## 2013-11-22 DIAGNOSIS — R0609 Other forms of dyspnea: Secondary | ICD-10-CM | POA: Insufficient documentation

## 2013-11-22 DIAGNOSIS — R0989 Other specified symptoms and signs involving the circulatory and respiratory systems: Secondary | ICD-10-CM | POA: Insufficient documentation

## 2013-11-22 NOTE — Progress Notes (Signed)
Stress Lab Nurses Notes - [facility]   Kaisa Wofford [facility]  11/22/2013  Reason for doing test: Chest Pain and Dyspnea  Type of test: Regular GTX  Nurse performing test: Gerrit Halls, RN  MD performing test: S. McDowell/K.Purcell Nails NP  Family MD: Dr. Geroge Baseman  Test explained and consent signed: Yes  IV started: No IV started  Symptoms: SOB  Treatment/Intervention: None  Reason test stopped: Fatigue  After recovery IV was: NA  Patient discharged: Home  Patient's Condition upon discharge was: Stable  Comments: During test peak BP 159/110 & HR 146. Recovery BP 108/69 & HR 89. Symptoms resolved in recovery.  Geanie Cooley T  Attending note:  Patient exercised on Bruce protocol for 6:24 reaching peak HR 151 BPM, 84% MPHR, maximum workload 8.1 METS. Stopped due to fatigue and dyspnea. No chest pain reported. Peak blood pressure 174/84 - hypertensive response. Equivocal ST changes noted with baseline ST/T abnormalities and lead motion artifact. This test just falls short of 85% heart rate response and in light of equivocal ST changes, does not exclude ischemic heart disease. Suggest consideration of an imaging study such as dobutamine echocardiogram or lexiscan cardiolite if further noninvasive evaluation of symptoms is warranted.  Satira Sark, M.D., F.A.C.C.

## 2013-11-22 NOTE — Progress Notes (Signed)
Stress Lab Nurses Notes - [facility] Na  Conchetta Lamia [facility] 11/22/2013 Reason for doing test: Chest Pain and Dyspnea Type of test: Regular GTX Nurse performing test: Gerrit Halls, RN Nuclear Medicine Tech: Not Applicable Echo Tech: Not Applicable MD performing test: S. McDowell/K.Purcell Nails NP Family MD: Dr. Geroge Baseman Test explained and consent signed: yes IV started: No IV started Symptoms: SOB Treatment/Intervention: None Reason test stopped: fatigue After recovery IV was: NA Patient to return to [place]. Med at : NA Patient discharged: Home Patient's Condition upon discharge was: stable Comments: During test peak BP 159/110 & HR 146.  Recovery BP 108/69 & HR 89.  Symptoms resolved in recovery.   Geanie Cooley T

## 2013-11-27 ENCOUNTER — Telehealth: Payer: Self-pay | Admitting: Family Medicine

## 2013-11-27 NOTE — Telephone Encounter (Signed)
Please advise 

## 2013-11-27 NOTE — Telephone Encounter (Signed)
Please call with results of ECHO & stress test  [phone]

## 2013-11-28 NOTE — Telephone Encounter (Signed)
Echo was wnl. I do not have the result of the ST yet. Thasnk AW

## 2013-11-29 ENCOUNTER — Telehealth: Payer: Self-pay | Admitting: *Deleted

## 2013-11-29 NOTE — Telephone Encounter (Signed)
Attempted to call, awaiting callback

## 2013-11-29 NOTE — Telephone Encounter (Signed)
Pt returned nurse's call, explained that Echo was wnl and we do not have results for stress test yet but when we do we will call her with those results also. Pt appreciative.

## 2013-11-29 NOTE — Telephone Encounter (Signed)
Nurse called to give results from echo. No answer, message left on cell. Home number also no answer and no VM setup.

## 2013-11-29 NOTE — Telephone Encounter (Signed)
See telephone encounter from today.

## 2013-12-11 ENCOUNTER — Ambulatory Visit (INDEPENDENT_AMBULATORY_CARE_PROVIDER_SITE_OTHER): Payer: BC Managed Care – PPO | Admitting: Family Medicine

## 2013-12-11 ENCOUNTER — Ambulatory Visit (HOSPITAL_COMMUNITY)
Admission: RE | Admit: 2013-12-11 | Discharge: 2013-12-11 | Disposition: A | Discharge status: Home / Self Care | Payer: BC Managed Care – PPO | Source: Ambulatory Visit | Attending: Family Medicine | Admitting: Family Medicine

## 2013-12-11 ENCOUNTER — Encounter: Payer: Self-pay | Admitting: Family Medicine

## 2013-12-11 VITALS — BP 118/76 | HR 102 | Temp 97.6°F | Resp 18 | Ht 64.0 in | Wt 196.0 lb

## 2013-12-11 DIAGNOSIS — R109 Unspecified abdominal pain: Secondary | ICD-10-CM | POA: Insufficient documentation

## 2013-12-11 DIAGNOSIS — R102 Pelvic and perineal pain: Secondary | ICD-10-CM

## 2013-12-11 DIAGNOSIS — R9389 Abnormal findings on diagnostic imaging of other specified body structures: Secondary | ICD-10-CM | POA: Insufficient documentation

## 2013-12-11 DIAGNOSIS — R319 Hematuria, unspecified: Secondary | ICD-10-CM

## 2013-12-11 DIAGNOSIS — R9439 Abnormal result of other cardiovascular function study: Secondary | ICD-10-CM

## 2013-12-11 DIAGNOSIS — G2581 Restless legs syndrome: Secondary | ICD-10-CM

## 2013-12-11 NOTE — Progress Notes (Signed)
   Subjective:    Patient ID: Judy Patton, female    DOB: 06-May-1971, 43 y.o.   MRN: [id number]  HPI  CP - improving, but still a few episodes, doesn't seem to be with exertion. Had treadmill ST - reccomended lexiscan cardiolite or dobutamine. Does have some mild shob with exertion. Nl echo.  Hematuria - h/o kidney stones, s/p lithotirpsy but never passed fragments. Had negative experience at hospital and with lithjotripsy (woke up in middle). Be;ives she contracted mrsa from the hospital. ucx last vist showed dirty catch. No dysuria/frequency, occasional flank pain and some pelvic discomfort. Periods irregular in heaviness but still monthly.   RLS - improving, on ferrous gluconate. H/o mennorhagia and poor PO iron intake likely source.        Review of Systems per hpi     Objective:   Physical Exam Nursing note and vitals reviewed. Constitutional: She is oriented to person, place, and time. She appears well-developed and well-nourished.  HENT:  Right Ear: External ear normal.  Left Ear: External ear normal.  Nose: Nose normal.  Mouth/Throat: Oropharynx is clear and moist. No oropharyngeal exudate.  Eyes: Conjunctivae are normal. Pupils are equal, round, and reactive to light.  Neck: Normal range of motion. Neck supple. No thyromegaly present.  Cardiovascular: Normal rate, regular rhythm and normal heart sounds.   Pulmonary/Chest: Effort normal and breath sounds normal.  Abdominal: Soft. Bowel sounds are normal. She exhibits no distension. There is no tenderness. There is no rebound.  Lymphadenopathy:    She has no cervical adenopathy.  Neurological: She is alert and oriented to person, place, and time. She has normal reflexes.  Skin: Skin is warm and dry.  Psychiatric: She has a normal mood and affect. Her behavior is normal.         Assessment & Plan:  Zera was seen today for follow-up.  Diagnoses and associated orders for this visit:  Hematuria - Urine  culture - DG Abd 1 View; Future - if all nondiagnostic will get TVUS - if stone, refer to uro (not same [facility] in [place] she previously saw) Abnormal stress electrocardiogram test using treadmill - Ambulatory referral to Cardiology - eval for lexiscan Restless leg - cont iron Pelvic pain See above  F/u 4-6 weeks

## 2013-12-12 LAB — URINE CULTURE
COLONY COUNT: NO GROWTH
ORGANISM ID, BACTERIA: NO GROWTH

## 2013-12-19 ENCOUNTER — Telehealth: Payer: Self-pay | Admitting: *Deleted

## 2013-12-19 ENCOUNTER — Other Ambulatory Visit: Payer: Self-pay | Admitting: Family Medicine

## 2013-12-19 DIAGNOSIS — N200 Calculus of kidney: Secondary | ICD-10-CM

## 2013-12-19 NOTE — Progress Notes (Signed)
See telephone encounter.

## 2013-12-19 NOTE — Telephone Encounter (Signed)
Spoke with pt and informed of results. She will call back and give name of urologist that she seen last. She also questioned her appointment/referral to cardio. Will route

## 2013-12-19 NOTE — Telephone Encounter (Signed)
Message copied by [facility], Louis Meckel on Wed Dec 19, 2013  3:16 PM ------      Message from: Doran Heater      Created: Wed Dec 19, 2013 12:04 PM       Please let pt know xr looks like stone as we suspected. Will refer to uro - please make sure we know who she saw last time so she can see someone different this time per her request. Thanks AW ------

## 2013-12-20 ENCOUNTER — Other Ambulatory Visit: Payer: Self-pay | Admitting: Adult Health

## 2013-12-20 NOTE — Telephone Encounter (Signed)
ref'l is in EPIC, spoke with Coralyn Mark and she will contact patient.  Thanks

## 2013-12-24 ENCOUNTER — Telehealth: Payer: Self-pay | Admitting: Family Medicine

## 2013-12-24 NOTE — Telephone Encounter (Signed)
Spoke with patient about urology appt again.  The appts i have made her she doesn't want.  (Dr. [facility] in [place], she also doesn't want to see Dr. Lise Auer in [place]) I asked her to please find someone she is interested in and let me know and I would be glad to make it for her.

## 2013-12-25 ENCOUNTER — Ambulatory Visit: Payer: BC Managed Care – PPO | Admitting: Cardiology

## 2013-12-26 ENCOUNTER — Encounter: Payer: Self-pay | Admitting: Cardiology

## 2013-12-26 ENCOUNTER — Encounter (INDEPENDENT_AMBULATORY_CARE_PROVIDER_SITE_OTHER): Payer: Self-pay

## 2013-12-26 ENCOUNTER — Ambulatory Visit (INDEPENDENT_AMBULATORY_CARE_PROVIDER_SITE_OTHER): Payer: BC Managed Care – PPO | Admitting: Cardiology

## 2013-12-26 ENCOUNTER — Encounter: Payer: Self-pay | Admitting: *Deleted

## 2013-12-26 VITALS — BP 123/56 | HR 90 | Ht 65.0 in | Wt 195.0 lb

## 2013-12-26 DIAGNOSIS — R609 Edema, unspecified: Secondary | ICD-10-CM

## 2013-12-26 DIAGNOSIS — R079 Chest pain, unspecified: Secondary | ICD-10-CM

## 2013-12-26 DIAGNOSIS — I10 Essential (primary) hypertension: Secondary | ICD-10-CM

## 2013-12-26 DIAGNOSIS — R600 Localized edema: Secondary | ICD-10-CM

## 2013-12-26 MED ORDER — FUROSEMIDE 20 MG PO TABS: 20.0000 mg | ORAL_TABLET | Freq: Every day | ORAL | Status: DC | PRN

## 2013-12-26 NOTE — Progress Notes (Signed)
Clinical Summary Judy Patton is a 43 y.o.female seen today as a new patient. She is referred for chest pain  1. Chest pain - noted increased SOB over the last few months, can occur at rest but mainly with exertion. Example walking from car to work has noted some DOE. Also developed LE edema over this same time frame  - chest pain started a few months ago. Dull pain left chest, 2/10. Can occur at rest or with exertion. Occurs approx 4 times over the last month. Stable in severity, no change in frequency - referred for exercise stress test by PCP Jan 2015, equivocal ST/T changes and did not quite meet THR (reached 84% of THR) - echo Jan 2015: LVEF 60-65%, no WMAs  CAD risk factors: mother with heart disease, unclear age.   Past Medical History  Diagnosis Date  . Hypothyroidism   . Depression   . Chronic headaches   . Kidney stones   . Stress     family  . MRSA (methicillin resistant Staphylococcus aureus)   . Restless leg syndrome   . Shoulder injury      No Known Allergies   Current Outpatient Prescriptions  Medication Sig Dispense Refill  . BALZIVA 0.4-35 MG-MCG tablet TAKE ONE TABLET BY MOUTH EVERY DAY AS DIRECTED  28 tablet  10  . citalopram (CELEXA) 40 MG tablet TAKE ONE TABLET BY MOUTH ONE TIME DAILY  30 tablet  11  . levothyroxine (SYNTHROID, LEVOTHROID) 88 MCG tablet TAKE ONE TABLET BY MOUTH ONE TIME DAILY  30 tablet  2  . rOPINIRole (REQUIP) 2 MG tablet Take 1 tablet (2 mg total) by mouth at bedtime.  30 tablet  11   No current facility-administered medications for this visit.     Past Surgical History  Procedure Laterality Date  . Esophagogastroduodenoscopy  03/25/2004    NWG:NFAOZH esophagus, small hiatal hernia, otherwise normal stomach  . Colonoscopy  03/25/2004    YQM:VHQI canal hemorrhoids, otherwise normal rectum,  . Lithotripsy  2008     No Known Allergies    Family History  Problem Relation Age of Onset  . Cancer Brother     kidney  .  Cancer Paternal Grandmother     breast cancer  . Cancer Paternal Grandfather     lung cancer     Social History Ms. Lape reports that she has never smoked. She has never used smokeless tobacco. Ms. Rieves reports that she does not drink alcohol.   Review of Systems CONSTITUTIONAL: No weight loss, fever, chills, weakness or fatigue.  HEENT: Eyes: No visual loss, blurred vision, double vision or yellow sclerae.No hearing loss, sneezing, congestion, runny nose or sore throat.  SKIN: No rash or itching.  CARDIOVASCULAR: per HPI RESPIRATORY: No shortness of breath, cough or sputum.  GASTROINTESTINAL: No anorexia, nausea, vomiting or diarrhea. No abdominal pain or blood.  GENITOURINARY: No burning on urination, no polyuria NEUROLOGICAL: No headache, dizziness, syncope, paralysis, ataxia, numbness or tingling in the extremities. No change in bowel or bladder control.  MUSCULOSKELETAL: Lower extremity edema LYMPHATICS: No enlarged nodes. No history of splenectomy.  PSYCHIATRIC: No history of depression or anxiety.  ENDOCRINOLOGIC: No reports of sweating, cold or heat intolerance. No polyuria or polydipsia.  Marland Kitchen   Physical Examination p  90 bp 123/56 Wt 195 lbs BMI 32 Gen: resting comfortably, no acute distress HEENT: no scleral icterus, pupils equal round and reactive, no palptable cervical adenopathy,  CV: RRR, no m/r/g, no JVD, no  carotid bruits Resp: Clear to auscultation bilaterally GI: abdomen is soft, non-tender, non-distended, normal bowel sounds, no hepatosplenomegaly MSK: extremities are warm, no edema.  Skin: warm, no rash Neuro:  no focal deficits Psych: appropriate affect   Diagnostic Studies Nov 22, 2013 Exercise Stress Test 6 min 24 sec, 8.1 METs, 84% THR. No chest pain. Equivocal ST/T changes.   Jan 2015 Echo LVEF 60-65%, no WMAs, normal diasotlic function,   Assessment and Plan  1. Chest pain - non-diagnostic exercise stress, equivocal ST/T changes and did not  reach target heart rate - will obtain Lexiscan  2. Lower extremity edema - unclear etiology, echo without any significant pathology. Sent for BMET with normal renal function. Could be venous insufficiency vs. Compromised venous return in the setting of obesity - given prn lasix 20mg , advised decrease sodium in diet.    Follow up 1 month   Arnoldo Lenis, M.D., F.A.C.C.

## 2013-12-26 NOTE — Patient Instructions (Signed)
Your physician recommends that you schedule a follow-up appointment in: [place] has recommended you make the following change in your medication:   1) START TAKING LASIX 20MG  DAILY AS NEEDED FOR SWELLING  Your physician has requested that you have a lexiscan myoview. For further information please visit [email or web address]. Please follow instruction sheet, as given.  WE WILL CALL YOU WITH YOUR TEST RESULTS/INSTRUCTIONS/NEXT STEPS ONCE RECEIVED BY THE PROVIDER   Your physician recommends that you return for lab work in: [place]

## 2013-12-27 LAB — BASIC METABOLIC PANEL
BUN: 10 mg/dL (ref 6–23)
CO2: 24 meq/L (ref 19–32)
CREATININE: 0.53 mg/dL (ref 0.50–1.10)
Calcium: 8.9 mg/dL (ref 8.4–10.5)
Chloride: 102 mEq/L (ref 96–112)
Glucose, Bld: 76 mg/dL (ref 70–99)
POTASSIUM: 3.8 meq/L (ref 3.5–5.3)
SODIUM: 137 meq/L (ref 135–145)

## 2013-12-28 ENCOUNTER — Encounter: Payer: Self-pay | Admitting: *Deleted

## 2013-12-31 ENCOUNTER — Ambulatory Visit: Payer: BC Managed Care – PPO | Admitting: Cardiovascular Disease

## 2014-01-07 ENCOUNTER — Encounter (HOSPITAL_COMMUNITY): Payer: BC Managed Care – PPO

## 2014-01-07 ENCOUNTER — Other Ambulatory Visit (HOSPITAL_COMMUNITY): Payer: BC Managed Care – PPO

## 2014-01-17 ENCOUNTER — Encounter: Payer: Self-pay | Admitting: Cardiology

## 2014-02-04 ENCOUNTER — Ambulatory Visit: Payer: BC Managed Care – PPO | Admitting: Cardiology

## 2014-02-14 ENCOUNTER — Ambulatory Visit (HOSPITAL_COMMUNITY): Payer: BC Managed Care – PPO

## 2014-02-14 ENCOUNTER — Encounter (HOSPITAL_COMMUNITY): Payer: Self-pay

## 2014-02-14 ENCOUNTER — Encounter (HOSPITAL_COMMUNITY)
Admission: RE | Admit: 2014-02-14 | Discharge: 2014-02-14 | Disposition: A | Discharge status: Home / Self Care | Payer: BC Managed Care – PPO | Source: Ambulatory Visit | Attending: Cardiology | Admitting: Cardiology

## 2014-02-14 ENCOUNTER — Encounter (HOSPITAL_COMMUNITY): Payer: BC Managed Care – PPO

## 2014-02-14 ENCOUNTER — Other Ambulatory Visit (HOSPITAL_COMMUNITY): Payer: BC Managed Care – PPO

## 2014-02-14 DIAGNOSIS — R079 Chest pain, unspecified: Secondary | ICD-10-CM

## 2014-02-14 MED ORDER — TECHNETIUM TC 99M SESTAMIBI - CARDIOLITE
10.0000 | Freq: Once | INTRAVENOUS | Status: AC | PRN
  Administered 2014-02-14: 08:00:00 10 via INTRAVENOUS

## 2014-02-14 MED ORDER — REGADENOSON 0.4 MG/5ML IV SOLN
INTRAVENOUS | Status: AC
  Administered 2014-02-14: 0.4 mg via INTRAVENOUS
  Filled 2014-02-14: qty 5

## 2014-02-14 MED ORDER — SODIUM CHLORIDE 0.9 % IJ SOLN
INTRAMUSCULAR | Status: AC
  Administered 2014-02-14: 10 mL via INTRAVENOUS
  Filled 2014-02-14: qty 10

## 2014-02-14 MED ORDER — TECHNETIUM TC 99M SESTAMIBI GENERIC - CARDIOLITE
30.0000 | Freq: Once | INTRAVENOUS | Status: AC | PRN
  Administered 2014-02-14: 30 via INTRAVENOUS

## 2014-02-14 NOTE — Progress Notes (Signed)
Stress Lab Nurses Notes - [facility] Na  Lenni Reckner [facility] 02/14/2014 Reason for doing test: Chest Pain and Dyspnea Type of test: Wille Glaser Nurse performing test: Gerrit Halls, RN Nuclear Medicine Tech: Melburn Hake Echo Tech: Not Applicable MD performing test: S. McDowell/K.LawrenceNP Family MD: Clydene Laming Test explained and consent signed: yes IV started: 22g jelco, Saline lock flushed, No redness or edema and Saline lock started in radiology Symptoms: "felt heart beating faster" & tingling in legs Treatment/Intervention: None Reason test stopped: protocol completed After recovery IV was: Discontinued via X-ray tech and No redness or edema Patient to return to [place]. Med at :10:15 Patient discharged: Home Patient's Condition upon discharge was: stable Comments: During test BP 129/72 & HR 120.  Recovery BP 104/69 & HR 86.  Symptoms resolved in recovery.  Donnajean Lopes

## 2014-06-03 ENCOUNTER — Other Ambulatory Visit: Payer: Self-pay | Admitting: Adult Health

## 2014-07-31 ENCOUNTER — Other Ambulatory Visit: Payer: Self-pay | Admitting: Adult Health

## 2014-09-02 ENCOUNTER — Encounter (HOSPITAL_COMMUNITY): Payer: Self-pay

## 2014-09-20 ENCOUNTER — Telehealth: Payer: Self-pay | Admitting: *Deleted

## 2014-09-20 NOTE — Telephone Encounter (Signed)
I called pt but she wants to talk to you about Celexa. She has started weaning herself off of it but she has questions for you. Thanks!! [place]

## 2014-09-20 NOTE — Telephone Encounter (Signed)
Left message to cal me monday

## 2014-09-23 ENCOUNTER — Telehealth: Payer: Self-pay | Admitting: Adult Health

## 2014-09-23 MED ORDER — CITALOPRAM HYDROBROMIDE 10 MG PO TABS: ORAL_TABLET | ORAL | Status: DC

## 2014-09-23 NOTE — Telephone Encounter (Signed)
Trying to wean off celexa, do 10 mg every 2-3 days x 2 weeks then 5 mg every 2-3 days x 2 weeks

## 2014-10-24 ENCOUNTER — Other Ambulatory Visit: Payer: Self-pay | Admitting: Adult Health

## 2014-10-29 ENCOUNTER — Telehealth: Payer: Self-pay | Admitting: Adult Health

## 2014-10-29 NOTE — Telephone Encounter (Signed)
Spoke with pt letting her know Balziva refill was sent to pharmacy. [place]

## 2014-12-26 ENCOUNTER — Telehealth: Payer: Self-pay | Admitting: Adult Health

## 2014-12-26 MED ORDER — CITALOPRAM HYDROBROMIDE 10 MG PO TABS: ORAL_TABLET | ORAL | Status: DC

## 2014-12-26 NOTE — Telephone Encounter (Signed)
Refilled celexa 10 mg #30 with 2 refills, she is taking 1 on Sunday and Wednesday now and doing well will continue to wean

## 2014-12-26 NOTE — Telephone Encounter (Signed)
Spoke with pt. Pt is on Celexa 10 mg. She takes 1 tab twice a week and it's working for her. She needs a refill on med. Thanks!! [place]

## 2015-02-04 ENCOUNTER — Other Ambulatory Visit: Payer: Self-pay | Admitting: Adult Health

## 2015-02-04 DIAGNOSIS — Z1231 Encounter for screening mammogram for malignant neoplasm of breast: Secondary | ICD-10-CM

## 2015-02-12 ENCOUNTER — Other Ambulatory Visit: Payer: Self-pay | Admitting: Adult Health

## 2015-02-18 ENCOUNTER — Encounter: Payer: Self-pay | Admitting: Adult Health

## 2015-02-18 ENCOUNTER — Ambulatory Visit (INDEPENDENT_AMBULATORY_CARE_PROVIDER_SITE_OTHER): Payer: BC Managed Care – PPO | Admitting: Adult Health

## 2015-02-18 VITALS — BP 136/82 | HR 100 | Ht 65.0 in | Wt 203.5 lb

## 2015-02-18 DIAGNOSIS — Z1212 Encounter for screening for malignant neoplasm of rectum: Secondary | ICD-10-CM

## 2015-02-18 DIAGNOSIS — R635 Abnormal weight gain: Secondary | ICD-10-CM

## 2015-02-18 DIAGNOSIS — E039 Hypothyroidism, unspecified: Secondary | ICD-10-CM

## 2015-02-18 DIAGNOSIS — R5383 Other fatigue: Secondary | ICD-10-CM

## 2015-02-18 DIAGNOSIS — Z01419 Encounter for gynecological examination (general) (routine) without abnormal findings: Secondary | ICD-10-CM | POA: Diagnosis not present

## 2015-02-18 DIAGNOSIS — Z3041 Encounter for surveillance of contraceptive pills: Secondary | ICD-10-CM

## 2015-02-18 HISTORY — DX: Other fatigue: R53.83

## 2015-02-18 HISTORY — DX: Abnormal weight gain: R63.5

## 2015-02-18 LAB — HEMOCCULT GUIAC POC 1CARD (OFFICE): Fecal Occult Blood, POC: NEGATIVE

## 2015-02-18 MED ORDER — NORETHINDRONE-ETH ESTRADIOL 0.4-35 MG-MCG PO TABS: 1.0000 | ORAL_TABLET | Freq: Every day | ORAL | Status: DC

## 2015-02-18 NOTE — Progress Notes (Signed)
Patient ID: Judy Patton, female   DOB: 01/23/1971, 44 y.o.   MRN: [id number] History of Present Illness: Judy Patton is a 44 year old white female, married in for well woman gyn exam.She a normal pap with negative HPV 12/01/12.She is complaining of weight gain,tired all the time, and some swelling in ankles at times.She has some shortness of breath with exertion.And she says husband says she snores. Has had more headaches but has had bad tooth.Periods light.  Current Medications, Allergies, Past Medical History, Past Surgical History, Family History and Social History were reviewed in [employer] record.     Review of Systems: Patient denies any hearing loss, blurred vision, chest pain, abdominal pain, problems with bowel movements, urination, or intercourse. No mood swings.See HPI for positives.Has pain in knees. Is weaning off celexa.Has RLS.   Physical Exam:BP 136/82 mmHg  Pulse 100  Ht 5\' 5"  (1.651 m)  Wt 203 lb 8 oz (92.307 kg)  BMI 33.86 kg/m2  LMP 02/12/2015 General:  Well developed, well nourished, no acute distress Skin:  Warm and dry Neck:  Midline trachea, normal thyroid, good ROM, no lymphadenopathy Lungs; Clear to auscultation bilaterally Breast:  No dominant palpable mass, retraction, or nipple discharge Cardiovascular: Regular rate and rhythm Abdomen:  Soft, non tender, no hepatosplenomegaly Pelvic:  External genitalia is normal in appearance, no lesions.  The vagina is normal in appearance. Urethra has no lesions or masses. The cervix is bulbous.  Uterus is felt to be normal size, shape, and contour.  No adnexal masses or tenderness noted.Bladder is non tender, no masses felt. Rectal: Good sphincter tone, no polyps, or hemorrhoids felt.  Hemoccult negative. Extremities/musculoskeletal:  No swelling or varicosities noted, no clubbing or cyanosis Psych:  No mood changes, alert and cooperative,seems happy   Impression: Well woman gyn exam no pap Weight  gain Fatigue Hypothyroid Contraceptive management    Plan: Check CBC,CMP,TSH and lipids Refilled balziva x 1 year Will talk when labs back may get sleep study to r/o sleep apnea  Pap and physical in 1 year Mammogram yearly Has refills on requip,synthroid and celexa

## 2015-02-18 NOTE — Patient Instructions (Signed)
Pap and physical in 1 year Mammogram yearly Will talk when labs back Fatigue Fatigue is a feeling of tiredness, lack of energy, lack of motivation, or feeling tired all the time. Having enough rest, good nutrition, and reducing stress will normally reduce fatigue. Consult your caregiver if it persists. The nature of your fatigue will help your caregiver to find out its cause. The treatment is based on the cause.  CAUSES  There are many causes for fatigue. Most of the time, fatigue can be traced to one or more of your habits or routines. Most causes fit into one or more of three general areas. They are: Lifestyle problems  Sleep disturbances.  Overwork.  Physical exertion.  Unhealthy habits.  Poor eating habits or eating disorders.  Alcohol and/or drug use .  Lack of proper nutrition (malnutrition). Psychological problems  Stress and/or anxiety problems.  Depression.  Grief.  Boredom. Medical Problems or Conditions  Anemia.  Pregnancy.  Thyroid gland problems.  Recovery from major surgery.  Continuous pain.  Emphysema or asthma that is not well controlled  Allergic conditions.  Diabetes.  Infections (such as mononucleosis).  Obesity.  Sleep disorders, such as sleep apnea.  Heart failure or other heart-related problems.  Cancer.  Kidney disease.  Liver disease.  Effects of certain medicines such as antihistamines, cough and cold remedies, prescription pain medicines, heart and blood pressure medicines, drugs used for treatment of cancer, and some antidepressants. SYMPTOMS  The symptoms of fatigue include:   Lack of energy.  Lack of drive (motivation).  Drowsiness.  Feeling of indifference to the surroundings. DIAGNOSIS  The details of how you feel help guide your caregiver in finding out what is causing the fatigue. You will be asked about your present and past health condition. It is important to review all medicines that you take, including  prescription and non-prescription items. A thorough exam will be done. You will be questioned about your feelings, habits, and normal lifestyle. Your caregiver may suggest blood tests, urine tests, or other tests to look for common medical causes of fatigue.  TREATMENT  Fatigue is treated by correcting the underlying cause. For example, if you have continuous pain or depression, treating these causes will improve how you feel. Similarly, adjusting the dose of certain medicines will help in reducing fatigue.  HOME CARE INSTRUCTIONS   Try to get the required amount of good sleep every night.  Eat a healthy and nutritious diet, and drink enough water throughout the day.  Practice ways of relaxing (including yoga or meditation).  Exercise regularly.  Make plans to change situations that cause stress. Act on those plans so that stresses decrease over time. Keep your work and personal routine reasonable.  Avoid street drugs and minimize use of alcohol.  Start taking a daily multivitamin after consulting your caregiver. SEEK MEDICAL CARE IF:   You have persistent tiredness, which cannot be accounted for.  You have fever.  You have unintentional weight loss.  You have headaches.  You have disturbed sleep throughout the night.  You are feeling sad.  You have constipation.  You have dry skin.  You have gained weight.  You are taking any new or different medicines that you suspect are causing fatigue.  You are unable to sleep at night.  You develop any unusual swelling of your legs or other parts of your body. SEEK IMMEDIATE MEDICAL CARE IF:   You are feeling confused.  Your vision is blurred.  You feel faint or pass  out.  You develop severe headache.  You develop severe abdominal, pelvic, or back pain.  You develop chest pain, shortness of breath, or an irregular or fast heartbeat.  You are unable to pass a normal amount of urine.  You develop abnormal bleeding such  as bleeding from the rectum or you vomit blood.  You have thoughts about harming yourself or committing suicide.  You are worried that you might harm someone else. MAKE SURE YOU:   Understand these instructions.  Will watch your condition.  Will get help right away if you are not doing well or get worse. Document Released: 08/15/2007 Document Revised: 01/10/2012 Document Reviewed: 02/19/2014 [facility] Patient Information 2015 [place], [place]. This information is not intended to replace advice given to you by your health care provider. Make sure you discuss any questions you have with your health care provider.

## 2015-02-19 ENCOUNTER — Telehealth: Payer: Self-pay | Admitting: Adult Health

## 2015-02-19 DIAGNOSIS — R5383 Other fatigue: Secondary | ICD-10-CM

## 2015-02-19 DIAGNOSIS — R0683 Snoring: Secondary | ICD-10-CM

## 2015-02-19 LAB — COMPREHENSIVE METABOLIC PANEL
A/G RATIO: 1.2 (ref 1.1–2.5)
ALT: 11 IU/L (ref 0–32)
AST: 14 IU/L (ref 0–40)
Albumin: 3.7 g/dL (ref 3.5–5.5)
Alkaline Phosphatase: 134 IU/L — ABNORMAL HIGH (ref 39–117)
BUN / CREAT RATIO: 16 (ref 9–23)
BUN: 9 mg/dL (ref 6–24)
Bilirubin Total: 0.2 mg/dL (ref 0.0–1.2)
CALCIUM: 9.1 mg/dL (ref 8.7–10.2)
CO2: 25 mmol/L (ref 18–29)
CREATININE: 0.56 mg/dL — AB (ref 0.57–1.00)
Chloride: 102 mmol/L (ref 97–108)
GFR calc Af Amer: 132 mL/min/{1.73_m2} (ref >59)
GFR calc non Af Amer: 115 mL/min/{1.73_m2} (ref >59)
Globulin, Total: 3.1 g/dL (ref 1.5–4.5)
Glucose: 87 mg/dL (ref 65–99)
Potassium: 3.8 mmol/L (ref 3.5–5.2)
Sodium: 141 mmol/L (ref 134–144)
TOTAL PROTEIN: 6.8 g/dL (ref 6.0–8.5)

## 2015-02-19 LAB — LIPID PANEL
CHOL/HDL RATIO: 2.6 ratio (ref 0.0–4.4)
Cholesterol, Total: 189 mg/dL (ref 100–199)
HDL: 73 mg/dL (ref >39)
LDL Calculated: 96 mg/dL (ref 0–99)
Triglycerides: 101 mg/dL (ref 0–149)
VLDL Cholesterol Cal: 20 mg/dL (ref 5–40)

## 2015-02-19 LAB — CBC
HCT: 38.8 % (ref 34.0–46.6)
Hemoglobin: 13 g/dL (ref 11.1–15.9)
MCH: 30.3 pg (ref 26.6–33.0)
MCHC: 33.5 g/dL (ref 31.5–35.7)
MCV: 90 fL (ref 79–97)
PLATELETS: 343 10*3/uL (ref 150–379)
RBC: 4.29 x10E6/uL (ref 3.77–5.28)
RDW: 13.4 % (ref 12.3–15.4)
WBC: 15.7 10*3/uL — AB (ref 3.4–10.8)

## 2015-02-19 LAB — TSH: TSH: 3.08 u[IU]/mL (ref 0.450–4.500)

## 2015-02-19 NOTE — Telephone Encounter (Signed)
Pt aware of labs will get sleep study

## 2015-02-20 ENCOUNTER — Telehealth: Payer: Self-pay | Admitting: Adult Health

## 2015-02-21 NOTE — Telephone Encounter (Signed)
Left message x 1. JSY 

## 2015-02-24 NOTE — Telephone Encounter (Signed)
Spoke with pt. Pt has decided not to do the sleep apnea test. She just wanted to let you know. Thanks!! [place]

## 2015-02-27 ENCOUNTER — Telehealth: Payer: Self-pay | Admitting: Adult Health

## 2015-02-27 NOTE — Telephone Encounter (Signed)
Left message I called, call me back in am

## 2015-02-28 ENCOUNTER — Telehealth: Payer: Self-pay | Admitting: Adult Health

## 2015-02-28 NOTE — Telephone Encounter (Signed)
Pt does not really want to do sleep study, is scheduled for 5/20,but will do, is worried about swelling and shortness of breath,and weight gain, will do study if normal will refer to cardiology to be re evaluated was seen in 2015.

## 2015-03-10 ENCOUNTER — Ambulatory Visit: Payer: BC Managed Care – PPO | Attending: Adult Health | Admitting: Sleep Medicine

## 2015-03-10 DIAGNOSIS — R0683 Snoring: Secondary | ICD-10-CM | POA: Insufficient documentation

## 2015-03-10 DIAGNOSIS — R5383 Other fatigue: Secondary | ICD-10-CM | POA: Insufficient documentation

## 2015-03-14 ENCOUNTER — Encounter: Payer: Self-pay | Admitting: Adult Health

## 2015-03-14 ENCOUNTER — Ambulatory Visit (INDEPENDENT_AMBULATORY_CARE_PROVIDER_SITE_OTHER): Payer: BC Managed Care – PPO | Admitting: Adult Health

## 2015-03-14 VITALS — BP 110/88 | HR 72 | Ht 65.0 in | Wt 204.0 lb

## 2015-03-14 DIAGNOSIS — K0889 Other specified disorders of teeth and supporting structures: Secondary | ICD-10-CM

## 2015-03-14 DIAGNOSIS — B029 Zoster without complications: Secondary | ICD-10-CM

## 2015-03-14 DIAGNOSIS — M2550 Pain in unspecified joint: Secondary | ICD-10-CM | POA: Insufficient documentation

## 2015-03-14 DIAGNOSIS — K088 Other specified disorders of teeth and supporting structures: Secondary | ICD-10-CM

## 2015-03-14 HISTORY — DX: Other specified disorders of teeth and supporting structures: K08.89

## 2015-03-14 HISTORY — DX: Zoster without complications: B02.9

## 2015-03-14 MED ORDER — VALACYCLOVIR HCL 1 G PO TABS: 1000.0000 mg | ORAL_TABLET | Freq: Three times a day (TID) | ORAL | Status: DC

## 2015-03-14 MED ORDER — AMOXICILLIN 500 MG PO CAPS: 500.0000 mg | ORAL_CAPSULE | Freq: Three times a day (TID) | ORAL | Status: DC

## 2015-03-14 MED ORDER — IBUPROFEN 800 MG PO TABS: 800.0000 mg | ORAL_TABLET | Freq: Three times a day (TID) | ORAL | Status: DC | PRN

## 2015-03-14 NOTE — Patient Instructions (Signed)

## 2015-03-14 NOTE — Progress Notes (Signed)
Subjective:     Patient ID: Judy Patton, female   DOB: 29-Oct-1971, 44 y.o.   MRN: [id number]  HPI Judy Patton a 43 year old worked in today for complaints of rash near left breast burns and hurts, has noticed for several days and she also said she has pain in socket where tooth pulled earlier this week and dentists office closed.She had her sleep study Monday night. Has not drank with straw or spit.Has not had fever or body aches.  Review of Systems + rash and pain left chest +pain in tooth socket, all other systems negative Reviewed past medical,surgical, social and family history. Reviewed medications and allergies.     Objective:   Physical Exam BP 110/88 mmHg  Pulse 72  Ht 5\' 5"  (1.651 m)  Wt 204 lb (92.534 kg)  BMI 33.95 kg/m2  LMP 02/12/2015,Skin warm and dry, has swelling left lower face, has red cluster of vesicles left chest, near breast.    Assessment:     Shingles Pain tooth socket    Plan:    Rx amoxil 500 mg #21 take 1 tid x 7 days Rx motrin 800 mg #60 1 every 8 hours prn with 1 refill Rx valtrex 1 gm #21 take 1 every 8 hours x 7 days with 1 refill Follow up prn See dentist  Review handout on shingles   No bra

## 2015-03-15 NOTE — Sleep Study (Signed)
  Judy A. Merlene Laughter, MD     [email or web address]        NOCTURNAL POLYSOMNOGRAM    LOCATION: SLEEP LAB FACILITY: [facility]   PHYSICIAN: Kwaku Mostafa A. Merlene Patton, M.D.   DATE OF STUDY: 03/10/2015.   REFERRING PHYSICIAN: Derrek Monaco, NP.   INDICATIONS: The patient is a 44 year old female who complains of snoring, daytime fatigue and hypersomnia.  MEDICATIONS:  Prior to Admission medications   Medication Sig Start Date End Date Taking? Authorizing Provider  amoxicillin (AMOXIL) 500 MG capsule Take 1 capsule (500 mg total) by mouth 3 (three) times daily. 03/14/15   Estill Dooms, NP  cephALEXin (KEFLEX) 500 MG capsule 500 mg 3 (three) times daily.  02/17/15   Historical Provider, MD  citalopram (CELEXA) 10 MG tablet Take as directed Patient not taking: Reported on 03/14/2015 12/26/14   Estill Dooms, NP  ibuprofen (ADVIL,MOTRIN) 800 MG tablet Take 1 tablet (800 mg total) by mouth every 8 (eight) hours as needed. 03/14/15   Estill Dooms, NP  levothyroxine (SYNTHROID, LEVOTHROID) 88 MCG tablet TAKE ONE TABLET BY MOUTH ONE TIME DAILY 07/31/14   Estill Dooms, NP  norethindrone-ethinyl estradiol Hulda Humphrey) 0.4-35 MG-MCG tablet Take 1 tablet by mouth daily. 02/18/15   Estill Dooms, NP  rOPINIRole (REQUIP) 2 MG tablet TAKE ONE TABLET BY MOUTH AT BEDTIME 06/03/14   Estill Dooms, NP  valACYclovir (VALTREX) 1000 MG tablet Take 1 tablet (1,000 mg total) by mouth 3 (three) times daily. 03/14/15   Estill Dooms, NP      EPWORTH SLEEPINESS SCALE: 10.   BMI: 34.   ARCHITECTURAL SUMMARY: Total recording time was 410  minutes. Sleep efficiency 86 %. Sleep latency 38 minutes. REM latency 148 minutes. Stage NI 2 %, N2 51 % and N3 18 % and REM sleep 30 %.    RESPIRATORY DATA:  Baseline oxygen saturation is 98 %. The lowest saturation is 88 %. The diagnostic AHI is 4. The RDI is 5. The REM AHI is 13.  LIMB MOVEMENT SUMMARY: PLM index 0.    ELECTROCARDIOGRAM SUMMARY: Average heart rate is 74 with no significant dysrhythmias observed.   IMPRESSION:  1. 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  Thanks for this referral.  Althea Backs A. Merlene Patton, M.D. Diplomat, [employer] adviser of [employer] Medicine.

## 2015-03-21 ENCOUNTER — Telehealth: Payer: Self-pay | Admitting: Adult Health

## 2015-03-21 NOTE — Telephone Encounter (Signed)
Sleep study not back yet

## 2015-03-27 ENCOUNTER — Telehealth: Payer: Self-pay | Admitting: Adult Health

## 2015-03-27 NOTE — Telephone Encounter (Signed)
Pt aware sleep study showed mild REM related obstructive sleep apnea not requiring positive pressure treatment, and since she is still having shortness of breath and some swelling that she should follow up with cardiologist, she has not seen him since last year, when had negative stress test and she voices agreement.

## 2015-04-26 ENCOUNTER — Other Ambulatory Visit: Payer: Self-pay | Admitting: Adult Health

## 2015-07-05 ENCOUNTER — Other Ambulatory Visit: Payer: Self-pay | Admitting: Adult Health

## 2015-09-04 ENCOUNTER — Ambulatory Visit (HOSPITAL_COMMUNITY)
Admission: RE | Admit: 2015-09-04 | Discharge: 2015-09-04 | Disposition: A | Discharge status: Home / Self Care | Payer: BC Managed Care – PPO | Source: Ambulatory Visit | Attending: Adult Health | Admitting: Adult Health

## 2015-09-04 DIAGNOSIS — Z1231 Encounter for screening mammogram for malignant neoplasm of breast: Secondary | ICD-10-CM | POA: Insufficient documentation

## 2015-10-28 IMAGING — NM NM MYOCAR SINGLE W/SPECT W/WALL MOTION & EF
2 series · 12 of 12 positions shown · non-contrast
Comparison: none

CLINICAL DATA: 42-year-old woman with history of chest pain and
nondiagnostic treadmill test. This study is requested to evaluate
for the presence of ischemia.

EXAM:
MYOCARDIAL IMAGING WITH SPECT (REST AND PHARMACOLOGIC-STRESS)
GATED LEFT VENTRICULAR WALL MOTION STUDY
LEFT VENTRICULAR EJECTION FRACTION
TECHNIQUE: Standard myocardial SPECT imaging was performed after resting
intravenous injection of 10 mCi Bc-XXm sestamibi. Subsequently,
intravenous infusion of Lexiscan was performed under the supervision
of the Cardiology staff. At peak effect of the drug, 30 mCi Bc-XXm
sestamibi was injected intravenously and standard myocardial SPECT
imaging was performed. Quantitative gated imaging was also performed
to evaluate left ventricular wall motion, and estimate left
ventricular ejection fraction.

[cs cardiac tc hi dose · 6.41mm/px · 6 of 512 frames shown]
[frame 43/512]
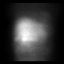
[frame 128/512]
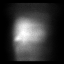
[frame 214/512]
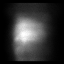
[frame 299/512]
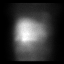
[frame 384/512]
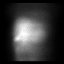
[frame 470/512]
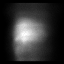

[cr cardiac tc low dose · 6.41mm/px · 6 of 64 frames shown]
[frame 6/64]
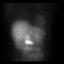
[frame 16/64]
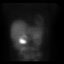
[frame 27/64]
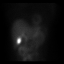
[frame 38/64]
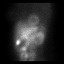
[frame 48/64]
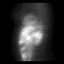
[frame 59/64]
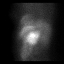

[12 of 12 positions shown; findings below may reference images not displayed]

FINDINGS: Baseline tracing shows sinus rhythm at 73 beats per min with diffuse
nonspecific ST T wave abnormalities with an leftward axis. Lexiscan
bolus was given in standard fashion. Heart rate increased from 71
beats per min up to 122 beats per min, and blood pressure increased
from 111/67 up to 129/72. Patient tolerated infusion well. No
clearly diagnostic ST segment abnormalities were noted over
baseline, and there were no significant arrhythmias.

Analysis of the overall perfusion data finds breast attenuation.

Tomographic views were obtained using the short axis, vertical long
axis, and horizontal long axis planes. There are no significant
reversible perfusion defects to indicate ischemia. Breast
attenuation results in a mild anterolateral fixed defect.

Gated imaging reveals an EDV of 74, ESV of 17, TID ratio 0.93, and
LVEF of 77% without wall motion abnormalities.
IMPRESSION: Low risk Lexiscan Cardiolite. There were no diagnostic ST segment
abnormalities over baseline changes. Perfusion imaging is most
consistent with breast attenuation, no clear evidence of scar or
ischemia. LVEF is calculated at 77% with normal volumes and normal
wall motion.

## 2016-02-20 ENCOUNTER — Other Ambulatory Visit: Payer: Self-pay | Admitting: Adult Health

## 2016-05-24 ENCOUNTER — Other Ambulatory Visit: Payer: Self-pay | Admitting: Adult Health

## 2017-04-02 ENCOUNTER — Other Ambulatory Visit: Payer: Self-pay | Admitting: Adult Health

## 2017-09-26 ENCOUNTER — Ambulatory Visit (INDEPENDENT_AMBULATORY_CARE_PROVIDER_SITE_OTHER): Payer: BC Managed Care – PPO | Admitting: Adult Health

## 2017-09-26 ENCOUNTER — Other Ambulatory Visit (HOSPITAL_COMMUNITY)
Admission: RE | Admit: 2017-09-26 | Discharge: 2017-09-26 | Disposition: A | Discharge status: Home / Self Care | Payer: BC Managed Care – PPO | Source: Ambulatory Visit | Attending: Adult Health | Admitting: Adult Health

## 2017-09-26 ENCOUNTER — Encounter: Payer: Self-pay | Admitting: Adult Health

## 2017-09-26 VITALS — BP 138/90 | HR 80 | Ht 65.0 in | Wt 194.0 lb

## 2017-09-26 DIAGNOSIS — R109 Unspecified abdominal pain: Secondary | ICD-10-CM

## 2017-09-26 DIAGNOSIS — Z86711 Personal history of pulmonary embolism: Secondary | ICD-10-CM

## 2017-09-26 DIAGNOSIS — Z1211 Encounter for screening for malignant neoplasm of colon: Secondary | ICD-10-CM | POA: Diagnosis not present

## 2017-09-26 DIAGNOSIS — Z1212 Encounter for screening for malignant neoplasm of rectum: Secondary | ICD-10-CM

## 2017-09-26 DIAGNOSIS — Z01419 Encounter for gynecological examination (general) (routine) without abnormal findings: Secondary | ICD-10-CM

## 2017-09-26 DIAGNOSIS — N920 Excessive and frequent menstruation with regular cycle: Secondary | ICD-10-CM | POA: Diagnosis not present

## 2017-09-26 DIAGNOSIS — N3281 Overactive bladder: Secondary | ICD-10-CM

## 2017-09-26 DIAGNOSIS — Z01411 Encounter for gynecological examination (general) (routine) with abnormal findings: Secondary | ICD-10-CM

## 2017-09-26 DIAGNOSIS — N943 Premenstrual tension syndrome: Secondary | ICD-10-CM | POA: Insufficient documentation

## 2017-09-26 LAB — HEMOCCULT GUIAC POC 1CARD (OFFICE): Fecal Occult Blood, POC: NEGATIVE

## 2017-09-26 MED ORDER — SERTRALINE HCL 50 MG PO TABS: ORAL_TABLET | ORAL | 3 refills | Status: DC

## 2017-09-26 NOTE — Progress Notes (Signed)
Patient ID: Judy Patton, female   DOB: 04/07/1971, 46 y.o.   MRN: [id number] History of Present Illness: Judy Patton is a 46 year old white female, married, in for well woman gyn exam and pap.She had PE in 2016 and is off OCs and periods are heavy with clots and she cramps more days than not and has PMS and is moody and has some OAB and leakage at times, not every day. PCP is Candi Leash in [place].    Current Medications, Allergies, Past Medical History, Past Surgical History, Family History and Social History were reviewed in [employer] record.     Review of Systems: Patient denies any headaches, hearing loss, fatigue, blurred vision, shortness of breath, chest pain, abdominal pain, problems with bowel movements, or intercourse. No joint pain, see HPI for positives.    Physical Exam:BP 138/90 (BP Location: Left Arm, Patient Position: Sitting, Cuff Size: Normal)   Pulse 80   Ht 5\' 5"  (1.651 m)   Wt 194 lb (88 kg)   LMP 09/17/2017   BMI 32.28 kg/m  General:  Well developed, well nourished, no acute distress Skin:  Warm and dry Neck:  Midline trachea, normal thyroid, good ROM, no lymphadenopathy Lungs; Clear to auscultation bilaterally Breast:  No dominant palpable mass, retraction, or nipple discharge Cardiovascular: Regular rate and rhythm Abdomen:  Soft, non tender, no hepatosplenomegaly Pelvic:  External genitalia is normal in appearance, no lesions.  The vagina is normal in appearance. Urethra has no lesions or masses. The cervix is bulbous. Pap with HPV performed. Uterus is felt to be normal size, shape, and contour.  No adnexal masses or tenderness noted.Bladder is non tender, no masses felt. Rectal: Good sphincter tone, no polyps, or hemorrhoids felt.  Hemoccult negative. Extremities/musculoskeletal:  No swelling or varicosities noted, no clubbing or cyanosis Psych:  No mood changes, alert and cooperative,seems happy PHQ 9 score 7, she denies depression just  PMS, moody. Will get [place] and discussed ablation vs hysterectomy and ParaGard IUD.Try kegels and will rx Zoloft for PMS days.   Impression: 1. Well woman exam with routine gynecological exam   2. Menorrhagia with regular cycle   3. PMS (premenstrual syndrome)   4. OAB (overactive bladder)   5. History of pulmonary embolus (PE)   6. Abdominal cramping   7. Screening for colorectal cancer       Plan: Rx zoloft 50 mg #30 take one prn for PMS, with 3 refills Return in 1 week for GYN [place] Review handouts on ablation and hysterectomy Physical in 1 year Pap in 3 if normal Mammogram yearly  Labs with PCP

## 2017-09-26 NOTE — Patient Instructions (Signed)
Hysterectomy Information A hysterectomy is a surgery to remove your uterus. After surgery, you will no longer have periods. Also, you will not be able to get pregnant. Reasons for this surgery  You have bleeding that is not normal and keeps coming back.  You have lasting (chronic) lower belly (pelvic) pain.  You have a lasting infection.  The lining of your uterus grows outside your uterus.  The lining of your uterus grows in the muscle of your uterus.  Your uterus falls down into your vagina.  You have a growth in your uterus that causes problems.  You have cells that could turn into cancer (precancerous cells).  You have cancer of the uterus or cervix. Types There are 3 types of hysterectomies. Depending on the type, the surgery will:  Remove the top part of the uterus only.  Remove the uterus and the cervix.  Remove the uterus, cervix, and tissue that holds the uterus in place in the lower belly.  Ways a hysterectomy can be performed There are 5 ways this surgery can be performed.  A cut (incision) is made in the belly (abdomen). The uterus is taken out through the cut.  A cut is made in the vagina. The uterus is taken out through the cut.  Three or four cuts are made in the belly. A surgical device with a camera is put through one of the cuts. The uterus is cut into small pieces. The uterus is taken out through the cuts or the vagina.  Three or four cuts are made in the belly. A surgical device with a camera is put through one of the cuts. The uterus is taken out through the vagina.  Three or four cuts are made in the belly. A surgical device that is controlled by a computer makes a visual image. The device helps the surgeon control the surgical tools. The uterus is cut into small pieces. The pieces are taken out through the cuts or through the vagina.  What can I expect after the surgery?  You will be given pain medicine.  You will need help at home for 3-5 days  after surgery.  You will need to see your doctor in 2-4 weeks after surgery.  You may get hot flashes, have night sweats, and have trouble sleeping.  You may need to have Pap tests in the future if your surgery was related to cancer. Talk to your doctor. It is still good to have regular exams. This information is not intended to replace advice given to you by your health care provider. Make sure you discuss any questions you have with your health care provider. Document Released: 01/10/2012 Document Revised: 03/25/2016 Document Reviewed: 06/25/2013 Elsevier Interactive Patient Education  2018 [place]. Endometrial Ablation Endometrial ablation is a procedure that destroys the thin inner layer of the lining of the uterus (endometrium). This procedure may be done:  To stop heavy periods.  To stop bleeding that is causing anemia.  To control irregular bleeding.  To treat bleeding caused by small tumors (fibroids) in the endometrium.  This procedure is often an alternative to major surgery, such as removal of the uterus and cervix (hysterectomy). As a result of this procedure:  You may not be able to have children. However, if you are premenopausal (you have not gone through menopause): ? You may still have a small chance of getting pregnant. ? You will need to use a reliable method of birth control after the procedure to prevent pregnancy.  You may stop having a menstrual period, or you may have only a small amount of bleeding during your period. Menstruation may return several years after the procedure.  Tell a health care provider about:  Any allergies you have.  All medicines you are taking, including vitamins, herbs, eye drops, creams, and over-the-counter medicines.  Any problems you or family members have had with the use of anesthetic medicines.  Any blood disorders you have.  Any surgeries you have had.  Any medical conditions you have. What are the  risks? Generally, this is a safe procedure. However, problems may occur, including:  A hole (perforation) in the uterus or bowel.  Infection of the uterus, bladder, or vagina.  Bleeding.  Damage to other structures or organs.  An air bubble in the lung (air embolus).  Problems with pregnancy after the procedure.  Failure of the procedure.  Decreased ability to diagnose cancer in the endometrium.  What happens before the procedure?  You will have tests of your endometrium to make sure there are no pre-cancerous cells or cancer cells present.  You may have an ultrasound of the uterus.  You may be given medicines to thin the endometrium.  Ask your health care provider about: ? Changing or stopping your regular medicines. This is especially important if you take diabetes medicines or blood thinners. ? Taking medicines such as aspirin and ibuprofen. These medicines can thin your blood. Do not take these medicines before your procedure if your doctor tells you not to.  Plan to have someone take you home from the hospital or clinic. What happens during the procedure?  You will lie on an exam table with your feet and legs supported as in a pelvic exam.  To lower your risk of infection: ? Your health care team will wash or sanitize their hands and put on germ-free (sterile) gloves. ? Your genital area will be washed with soap.  An IV tube will be inserted into one of your veins.  You will be given a medicine to help you relax (sedative).  A surgical instrument with a light and camera (resectoscope) will be inserted into your vagina and moved into your uterus. This allows your surgeon to see inside your uterus.  Endometrial tissue will be removed using one of the following methods: ? Radiofrequency. This method uses a radiofrequency-alternating electric current to remove the endometrium. ? Cryotherapy. This method uses extreme cold to freeze the endometrium. ? Heated-free  liquid. This method uses a heated saltwater (saline) solution to remove the endometrium. ? Microwave. This method uses high-energy microwaves to heat up the endometrium and remove it. ? Thermal balloon. This method involves inserting a catheter with a balloon tip into the uterus. The balloon tip is filled with heated fluid to remove the endometrium. The procedure may vary among health care providers and hospitals. What happens after the procedure?  Your blood pressure, heart rate, breathing rate, and blood oxygen level will be monitored until the medicines you were given have worn off.  As tissue healing occurs, you may notice vaginal bleeding for 4-6 weeks after the procedure. You may also experience: ? Cramps. ? Thin, watery vaginal discharge that is light pink or brown in color. ? A need to urinate more frequently than usual. ? Nausea.  Do not drive for 24 hours if you were given a sedative.  Do not have sex or insert anything into your vagina until your health care provider approves. Summary  Endometrial ablation is done  to treat the many causes of heavy menstrual bleeding.  The procedure may be done only after medications have been tried to control the bleeding.  Plan to have someone take you home from the hospital or clinic. This information is not intended to replace advice given to you by your health care provider. Make sure you discuss any questions you have with your health care provider. Document Released: 08/27/2004 Document Revised: 11/04/2016 Document Reviewed: 11/04/2016 Elsevier Interactive Patient Education  2017 [employer].

## 2017-09-27 LAB — CYTOLOGY - PAP
ADEQUACY: ABSENT
DIAGNOSIS: NEGATIVE
HPV (WINDOPATH): NOT DETECTED

## 2017-10-03 ENCOUNTER — Ambulatory Visit (INDEPENDENT_AMBULATORY_CARE_PROVIDER_SITE_OTHER): Payer: BC Managed Care – PPO

## 2017-10-03 DIAGNOSIS — N920 Excessive and frequent menstruation with regular cycle: Secondary | ICD-10-CM | POA: Diagnosis not present

## 2017-10-03 DIAGNOSIS — R109 Unspecified abdominal pain: Secondary | ICD-10-CM | POA: Diagnosis not present

## 2017-10-03 NOTE — Progress Notes (Signed)
PELVIC US TA/TV: homogeneous anteverted subseptate uterus,wnl,mult nabothian cysts,EEC 5.7 mm,normal ovaries bilat,no free fluid,no pain during ultrasound,ovaries appear mobile

## 2017-10-04 ENCOUNTER — Telehealth: Payer: Self-pay | Admitting: Adult Health

## 2017-10-04 NOTE — Telephone Encounter (Signed)
Pt aware [place] was normal, will make appt with MD to discuss surgical options

## 2017-10-10 ENCOUNTER — Ambulatory Visit: Payer: BC Managed Care – PPO | Admitting: Obstetrics and Gynecology

## 2017-10-17 ENCOUNTER — Encounter: Payer: Self-pay | Admitting: Obstetrics and Gynecology

## 2017-10-17 ENCOUNTER — Ambulatory Visit: Payer: BC Managed Care – PPO | Admitting: Obstetrics and Gynecology

## 2017-10-17 VITALS — BP 130/80 | HR 98 | Ht 65.0 in | Wt 194.2 lb

## 2017-10-17 DIAGNOSIS — Z7901 Long term (current) use of anticoagulants: Secondary | ICD-10-CM

## 2017-10-17 DIAGNOSIS — Z113 Encounter for screening for infections with a predominantly sexual mode of transmission: Secondary | ICD-10-CM | POA: Diagnosis not present

## 2017-10-17 DIAGNOSIS — Z3009 Encounter for other general counseling and advice on contraception: Secondary | ICD-10-CM

## 2017-10-17 DIAGNOSIS — N946 Dysmenorrhea, unspecified: Secondary | ICD-10-CM

## 2017-10-17 DIAGNOSIS — N920 Excessive and frequent menstruation with regular cycle: Secondary | ICD-10-CM

## 2017-10-17 NOTE — Progress Notes (Signed)
Patient ID: Judy Patton, female   DOB: [birth year], 46 y.o.   MRN: [id number]   [facility] Visit  @DATE @            Patient name: Judy Patton MRN [id number]  Date of birth: 28-Jul-1971  CC & HPI:  Judy Patton is a 46 y.o. female presenting today to discuss surgical options for bleeding issues and moderate abdominal cramping. She experiences irregular menstrual cycles since this past summer. She had a PE about 2 years ago and had to stop taking birth control. She was on birth control for about 22 years. She is on life time anticoagulants. Since starting Xarelto she has been passing blood clots. Her cramps have gotten worse and she states that she needs to take ibuprofen for pain, even though she had been advised not to take it. She notes tylenol does not help her pain. Since she is not able to take birth control she would like to have her tubes tied or removed. The patient denies fever, chills, or any other complaints at this time.   Procedure requested: 1. Laparoscopic Bilateral Salpingectomy                                          2. Hystereoscopy, /Dilation and Curettage,   Endometrial ablation ROS:  ROS +menorrhagia  +blood clots -fever -chills All systems are negative except as noted in the HPI and PMH.   Pertinent History Reviewed:   Reviewed: Significant for PE,  Medical         Past Medical History:  Diagnosis Date  . Chronic headaches   . Depression   . Factor V Leiden mutation ([place])   . Fatigue 02/18/2015  . Hypothyroidism   . Kidney stones   . MRSA (methicillin resistant Staphylococcus aureus)   . Pain of tooth socket 03/14/2015  . Pulmonary embolus ([place])   . Restless leg syndrome   . Shingles rash 03/14/2015  . Shoulder injury   . Stress    family  . Weight gain 02/18/2015                              Surgical Hx:    Past Surgical History:  Procedure Laterality Date  . COLONOSCOPY  03/25/2004   LDJ:TTSV canal hemorrhoids, otherwise normal rectum,  .  ESOPHAGOGASTRODUODENOSCOPY  03/25/2004   XBL:TJQZES esophagus, small hiatal hernia, otherwise normal stomach  . kidney stones  05/2014,11/2014  . LITHOTRIPSY  2008   Medications: Reviewed & Updated - see associated section                       Current Outpatient Medications:  .  levothyroxine (SYNTHROID, LEVOTHROID) 88 MCG tablet, TAKE ONE TABLET BY MOUTH ONE TIME DAILY, Disp: 30 tablet, Rfl: 9 .  rOPINIRole (REQUIP) 2 MG tablet, TAKE ONE TABLET BY MOUTH AT BEDTIME, Disp: 30 tablet, Rfl: 9 .  XARELTO 20 MG TABS tablet, TAKE 1 TABLET EVERY DAY WITH FOOD, Disp: , Rfl: 2 .  sertraline (ZOLOFT) 50 MG tablet, Take one for PMS as needed (Patient not taking: Reported on 10/17/2017), Disp: 30 tablet, Rfl: 3   Social History: Reviewed -  reports that  has never smoked. she has never used smokeless tobacco.  Objective Findings:  Vitals: Blood pressure 130/80, pulse 98,  height 5\' 5"  (1.651 m), weight 194 lb 3.2 oz (88.1 kg), last menstrual period 10/12/2017.  PHYSICAL EXAMINATION General appearance - alert, well appearing, and in no distress, oriented to person, place, and time and overweight Mental status - alert, oriented to person, place, and time, normal mood, behavior, speech, dress, motor activity, and thought processes, affect appropriate to mood  PELVIC External genitalia - normal Vulva - normal Vagina - light pink discharge Cervix - good support  Uterus - good support, anti-flexed, arcuate  Adnexa - normal  Discussion: 1. Discussed with pt risks and benefits of endometrial ablation vs hysterectomy.    At end of discussion, pt had opportunity to ask questions and has no further questions at this time.   Specific discussion of surgery options as noted above. Greater than 50% was spent in counseling and coordination of care with the patient.   Total time greater than: 25 minutes.    Assessment & Plan:   A:  1. Menrrhagia and dysmenorrhea on xalerto 2. Desire for elective  permanent sterilization. P:  1. D&C, Endometrial Ablation, bilateral salpingectomy on 11/08/2017 2. F/U post op check 2 weeks after surgery    By signing my name below, I, Margit Banda, attest that this documentation has been prepared under the direction and in the presence of Jonnie Kind, MD. Electronically Signed: Margit Banda, Medical Scribe. 10/17/17. 10:48 AM.  I personally performed the services described in this documentation, which was SCRIBED in my presence. The recorded information has been reviewed and considered accurate. It has been edited as necessary during review. Jonnie Kind, MD

## 2017-10-19 LAB — GC/CHLAMYDIA PROBE AMP
Chlamydia trachomatis, NAA: NEGATIVE
Neisseria gonorrhoeae by PCR: NEGATIVE

## 2017-10-27 NOTE — Patient Instructions (Signed)
[facility]  10/27/2017     @PREFPERIOPPHARMACY @   Your procedure is scheduled on  11/08/2017   Report to [facility] at  1045   A.M.  Call this number if you have problems the morning of surgery:  [phone]   Remember:  Do not eat food or drink liquids after midnight.  Take these medicines the morning of surgery with A SIP OF WATER  Levothyroxine.   Do not wear jewelry, make-up or nail polish.  Do not wear lotions, powders, or perfumes, or deodorant.  Do not shave 48 hours prior to surgery.  Men may shave face and neck.  Do not bring valuables to the hospital.  [facility] is not responsible for any belongings or valuables.  Contacts, dentures or bridgework may not be worn into surgery.  Leave your suitcase in the car.  After surgery it may be brought to your room.  For patients admitted to the hospital, discharge time will be determined by your treatment team.  Patients discharged the day of surgery will not be allowed to drive home.   Name and phone number of your driver:   family Special instructions:  None  Please read over the following fact sheets that you were given. Anesthesia Post-op Instructions and Care and Recovery After Surgery       Dilation and Curettage or Vacuum Curettage Dilation and curettage (D&C) and vacuum curettage are minor procedures. A D&C involves stretching (dilation) the cervix and scraping (curettage) the inside lining of the uterus (endometrium). During a D&C, tissue is gently scraped from the endometrium, starting from the top portion of the uterus down to the lowest part of the uterus (cervix). During a vacuum curettage, the lining and tissue in the uterus are removed with the use of gentle suction. Curettage may be performed to either diagnose or treat a problem. As a diagnostic procedure, curettage is performed to examine tissues from the uterus. A diagnostic curettage may be done if you  have:  Irregular bleeding in the uterus.  Bleeding with the development of clots.  Spotting between menstrual periods.  Prolonged menstrual periods or other abnormal bleeding.  Bleeding after menopause.  No menstrual period (amenorrhea).  A change in size and shape of the uterus.  Abnormal endometrial cells discovered during a Pap test.  As a treatment procedure, curettage may be performed for the following reasons:  Removal of an IUD (intrauterine device).  Removal of retained placenta after giving birth.  Abortion.  Miscarriage.  Removal of endometrial polyps.  Removal of uncommon types of noncancerous lumps (fibroids).  Tell a health care provider about:  Any allergies you have, including allergies to prescribed medicine or latex.  All medicines you are taking, including vitamins, herbs, eye drops, creams, and over-the-counter medicines. This is especially important if you take any blood-thinning medicine. Bring a list of all of your medicines to your appointment.  Any problems you or family members have had with anesthetic medicines.  Any blood disorders you have.  Any surgeries you have had.  Your medical history and any medical conditions you have.  Whether you are pregnant or may be pregnant.  Recent vaginal infections you have had.  Recent menstrual periods, bleeding problems you have had, and what form of birth control (contraception) you use. What are the risks? Generally, this is a safe procedure. However, problems may occur, including:  Infection.  Heavy vaginal bleeding.  Allergic reactions to medicines.  Damage to the cervix or other structures or organs.  Development of scar tissue (adhesions) inside the uterus, which can cause abnormal amounts of menstrual bleeding. This may make it harder to get pregnant in the future.  A hole (perforation) or puncture in the uterine wall. This is rare.  What happens before the procedure? Staying  hydrated Follow instructions from your health care provider about hydration, which may include:  Up to 2 hours before the procedure - you may continue to drink clear liquids, such as water, clear fruit juice, black coffee, and plain tea.  Eating and drinking restrictions Follow instructions from your health care provider about eating and drinking, which may include:  8 hours before the procedure - stop eating heavy meals or foods such as meat, fried foods, or fatty foods.  6 hours before the procedure - stop eating light meals or foods, such as toast or cereal.  6 hours before the procedure - stop drinking milk or drinks that contain milk.  2 hours before the procedure - stop drinking clear liquids. If your health care provider told you to take your medicine(s) on the day of your procedure, take them with only a sip of water.  Medicines  Ask your health care provider about: ? Changing or stopping your regular medicines. This is especially important if you are taking diabetes medicines or blood thinners. ? Taking medicines such as aspirin and ibuprofen. These medicines can thin your blood. Do not take these medicines before your procedure if your health care provider instructs you not to.  You may be given antibiotic medicine to help prevent infection. General instructions  For 24 hours before your procedure, do not: ? Douche. ? Use tampons. ? Use medicines, creams, or suppositories in the vagina. ? Have sexual intercourse.  You may be given a pregnancy test on the day of the procedure.  Plan to have someone take you home from the hospital or clinic.  You may have a blood or urine sample taken.  If you will be going home right after the procedure, plan to have someone with you for 24 hours. What happens during the procedure?  To reduce your risk of infection: ? Your health care team will wash or sanitize their hands. ? Your skin will be washed with soap.  An IV tube will be  inserted into one of your veins.  You will be given one of the following: ? A medicine that numbs the area in and around the cervix (local anesthetic). ? A medicine to make you fall asleep (general anesthetic).  You will lie down on your back, with your feet in foot rests (stirrups).  The size and position of your uterus will be checked.  A lubricated instrument (speculum or Sims retractor) will be inserted into the back side of your vagina. The speculum will be used to hold apart the walls of your vagina so your health care provider can see your cervix.  A tool (tenaculum) will be attached to the lip of the cervix to stabilize it.  Your cervix will be softened and dilated. This may be done by: ? Taking a medicine. ? Having tapered dilators or thin rods (laminaria) or gradual widening instruments (tapered dilators) inserted into your cervix.  A small, sharp, curved instrument (curette) will be used to scrape a small amount of tissue or cells from the endometrium or cervical canal. In some cases, gentle suction is  applied with the curette. The curette will then be removed. The cells will be taken to a lab for testing. The procedure may vary among health care providers and hospitals. What happens after the procedure?  You may have mild cramping, backache, pain, and light bleeding or spotting. You may pass small blood clots from your vagina.  You may have to wear compression stockings. These stockings help to prevent blood clots and reduce swelling in your legs.  Your blood pressure, heart rate, breathing rate, and blood oxygen level will be monitored until the medicines you were given have worn off. Summary  Dilation and curettage (D&C) involves stretching (dilation) the cervix and scraping (curettage) the inside lining of the uterus (endometrium).  After the procedure, you may have mild cramping, backache, pain, and light bleeding or spotting. You may pass small blood clots from your  vagina.  Plan to have someone take you home from the hospital or clinic. This information is not intended to replace advice given to you by your health care provider. Make sure you discuss any questions you have with your health care provider. Document Released: 10/18/2005 Document Revised: 07/04/2016 Document Reviewed: 07/04/2016 Elsevier Interactive Patient Education  2018 [employer].  Dilation and Curettage or Vacuum Curettage, Care After These instructions give you information about caring for yourself after your procedure. Your doctor may also give you more specific instructions. Call your doctor if you have any problems or questions after your procedure. Follow these instructions at home: Activity  Do not drive or use heavy machinery while taking prescription pain medicine.  For 24 hours after your procedure, avoid driving.  Take short walks often, followed by rest periods. Ask your doctor what activities are safe for you. After one or two days, you may be able to return to your normal activities.  Do not lift anything that is heavier than 10 lb (4.5 kg) until your doctor approves.  For at least 2 weeks, or as long as told by your doctor: ? Do not douche. ? Do not use tampons. ? Do not have sex. General instructions  Take over-the-counter and prescription medicines only as told by your doctor. This is very important if you take blood thinning medicine.  Do not take baths, swim, or use a hot tub until your doctor approves. Take showers instead of baths.  Wear compression stockings as told by your doctor.  It is up to you to get the results of your procedure. Ask your doctor when your results will be ready.  Keep all follow-up visits as told by your doctor. This is important. Contact a doctor if:  You have very bad cramps that get worse or do not get better with medicine.  You have very bad pain in your belly (abdomen).  You cannot drink fluids without throwing up  (vomiting).  You get pain in a different part of the area between your belly and thighs (pelvis).  You have bad-smelling discharge from your vagina.  You have a rash. Get help right away if:  You are bleeding a lot from your vagina. A lot of bleeding means soaking more than one sanitary pad in an hour, for 2 hours in a row.  You have clumps of blood (blood clots) coming from your vagina.  You have a fever or chills.  Your belly feels very tender or hard.  You have chest pain.  You have trouble breathing.  You cough up blood.  You feel dizzy.  You feel light-headed.  You pass out (faint).  You have pain in your neck or shoulder area. Summary  Take short walks often, followed by rest periods. Ask your doctor what activities are safe for you. After one or two days, you may be able to return to your normal activities.  Do not lift anything that is heavier than 10 lb (4.5 kg) until your doctor approves.  Do not take baths, swim, or use a hot tub until your doctor approves. Take showers instead of baths.  Contact your doctor if you have any symptoms of infection, like bad-smelling discharge from your vagina. This information is not intended to replace advice given to you by your health care provider. Make sure you discuss any questions you have with your health care provider. Document Released: 07/27/2008 Document Revised: 07/05/2016 Document Reviewed: 07/05/2016 Elsevier Interactive Patient Education  2017 [place]. Hysteroscopy Hysteroscopy is a procedure used for looking inside the womb (uterus). It may be done for various reasons, including:  To evaluate abnormal bleeding, fibroid (benign, noncancerous) tumors, polyps, scar tissue (adhesions), and possibly cancer of the uterus.  To look for lumps (tumors) and other uterine growths.  To look for causes of why a woman cannot get pregnant (infertility), causes of recurrent loss of pregnancy (miscarriages), or a lost  intrauterine device (IUD).  To perform a sterilization by blocking the fallopian tubes from inside the uterus.  In this procedure, a thin, flexible tube with a tiny light and camera on the end of it (hysteroscope) is used to look inside the uterus. A hysteroscopy should be done right after a menstrual period to be sure you are not pregnant. LET [facility] CARE PROVIDER KNOW ABOUT:  Any allergies you have.  All medicines you are taking, including vitamins, herbs, eye drops, creams, and over-the-counter medicines.  Previous problems you or members of your family have had with the use of anesthetics.  Any blood disorders you have.  Previous surgeries you have had.  Medical conditions you have. RISKS AND COMPLICATIONS Generally, this is a safe procedure. However, as with any procedure, complications can occur. Possible complications include:  Putting a hole in the uterus.  Excessive bleeding.  Infection.  Damage to the cervix.  Injury to other organs.  Allergic reaction to medicines.  Too much fluid used in the uterus for the procedure.  BEFORE THE PROCEDURE  Ask your health care provider about changing or stopping any regular medicines.  Do not take aspirin or blood thinners for 1 week before the procedure, or as directed by your health care provider. These can cause bleeding.  If you smoke, do not smoke for 2 weeks before the procedure.  In some cases, a medicine is placed in the cervix the day before the procedure. This medicine makes the cervix have a larger opening (dilate). This makes it easier for the instrument to be inserted into the uterus during the procedure.  Do not eat or drink anything for at least 8 hours before the surgery.  Arrange for someone to take you home after the procedure. PROCEDURE  You may be given a medicine to relax you (sedative). You may also be given one of the following: ? A medicine that numbs the area around the cervix (local  anesthetic). ? A medicine that makes you sleep through the procedure (general anesthetic).  The hysteroscope is inserted through the vagina into the uterus. The camera on the hysteroscope sends a picture to a TV screen. This gives the surgeon a good view  inside the uterus.  During the procedure, air or a liquid is put into the uterus, which allows the surgeon to see better.  Sometimes, tissue is gently scraped from inside the uterus. These tissue samples are sent to a lab for testing. What to expect after the procedure  If you had a general anesthetic, you may be groggy for a couple hours after the procedure.  If you had a local anesthetic, you will be able to go home as soon as you are stable and feel ready.  You may have some cramping. This normally lasts for a couple days.  You may have bleeding, which varies from light spotting for a few days to menstrual-like bleeding for 3-7 days. This is normal.  If your test results are not back during the visit, make an appointment with your health care provider to find out the results. This information is not intended to replace advice given to you by your health care provider. Make sure you discuss any questions you have with your health care provider. Document Released: 01/24/2001 Document Revised: 03/25/2016 Document Reviewed: 05/17/2013 Elsevier Interactive Patient Education  2017 [place]. Hysteroscopy, Care After Refer to this sheet in the next few weeks. These instructions provide you with information on caring for yourself after your procedure. Your health care provider may also give you more specific instructions. Your treatment has been planned according to current medical practices, but problems sometimes occur. Call your health care provider if you have any problems or questions after your procedure. What can I expect after the procedure? After your procedure, it is typical to have the following:  You may have some cramping. This  normally lasts for a couple days.  You may have bleeding. This can vary from light spotting for a few days to menstrual-like bleeding for 3-7 days.  Follow these instructions at home:  Rest for the first 1-2 days after the procedure.  Only take over-the-counter or prescription medicines as directed by your health care provider. Do not take aspirin. It can increase the chances of bleeding.  Take showers instead of baths for 2 weeks or as directed by your health care provider.  Do not drive for 24 hours or as directed.  Do not drink alcohol while taking pain medicine.  Do not use tampons, douche, or have sexual intercourse for 2 weeks or until your health care provider says it is okay.  Take your temperature twice a day for 4-5 days. Write it down each time.  Follow your health care provider's advice about diet, exercise, and lifting.  If you develop constipation, you may: ? Take a mild laxative if your health care provider approves. ? Add bran foods to your diet. ? Drink enough fluids to keep your urine clear or pale yellow.  Try to have someone with you or available to you for the first 24-48 hours, especially if you were given a general anesthetic.  Follow up with your health care provider as directed. Contact a health care provider if:  You feel dizzy or lightheaded.  You feel sick to your stomach (nauseous).  You have abnormal vaginal discharge.  You have a rash.  You have pain that is not controlled with medicine. Get help right away if:  You have bleeding that is heavier than a normal menstrual period.  You have a fever.  You have increasing cramps or pain, not controlled with medicine.  You have new belly (abdominal) pain.  You pass out.  You have pain  in the tops of your shoulders (shoulder strap areas).  You have shortness of breath. This information is not intended to replace advice given to you by your health care provider. Make sure you discuss any  questions you have with your health care provider. Document Released: 08/08/2013 Document Revised: 03/25/2016 Document Reviewed: 05/17/2013 Elsevier Interactive Patient Education  2017 [place].  Endometrial Ablation Endometrial ablation is a procedure that destroys the thin inner layer of the lining of the uterus (endometrium). This procedure may be done:  To stop heavy periods.  To stop bleeding that is causing anemia.  To control irregular bleeding.  To treat bleeding caused by small tumors (fibroids) in the endometrium.  This procedure is often an alternative to major surgery, such as removal of the uterus and cervix (hysterectomy). As a result of this procedure:  You may not be able to have children. However, if you are premenopausal (you have not gone through menopause): ? You may still have a small chance of getting pregnant. ? You will need to use a reliable method of birth control after the procedure to prevent pregnancy.  You may stop having a menstrual period, or you may have only a small amount of bleeding during your period. Menstruation may return several years after the procedure.  Tell a health care provider about:  Any allergies you have.  All medicines you are taking, including vitamins, herbs, eye drops, creams, and over-the-counter medicines.  Any problems you or family members have had with the use of anesthetic medicines.  Any blood disorders you have.  Any surgeries you have had.  Any medical conditions you have. What are the risks? Generally, this is a safe procedure. However, problems may occur, including:  A hole (perforation) in the uterus or bowel.  Infection of the uterus, bladder, or vagina.  Bleeding.  Damage to other structures or organs.  An air bubble in the lung (air embolus).  Problems with pregnancy after the procedure.  Failure of the procedure.  Decreased ability to diagnose cancer in the endometrium.  What happens  before the procedure?  You will have tests of your endometrium to make sure there are no pre-cancerous cells or cancer cells present.  You may have an ultrasound of the uterus.  You may be given medicines to thin the endometrium.  Ask your health care provider about: ? Changing or stopping your regular medicines. This is especially important if you take diabetes medicines or blood thinners. ? Taking medicines such as aspirin and ibuprofen. These medicines can thin your blood. Do not take these medicines before your procedure if your doctor tells you not to.  Plan to have someone take you home from the hospital or clinic. What happens during the procedure?  You will lie on an exam table with your feet and legs supported as in a pelvic exam.  To lower your risk of infection: ? Your health care team will wash or sanitize their hands and put on germ-free (sterile) gloves. ? Your genital area will be washed with soap.  An IV tube will be inserted into one of your veins.  You will be given a medicine to help you relax (sedative).  A surgical instrument with a light and camera (resectoscope) will be inserted into your vagina and moved into your uterus. This allows your surgeon to see inside your uterus.  Endometrial tissue will be removed using one of the following methods: ? Radiofrequency. This method uses a radiofrequency-alternating electric current to remove  the endometrium. ? Cryotherapy. This method uses extreme cold to freeze the endometrium. ? Heated-free liquid. This method uses a heated saltwater (saline) solution to remove the endometrium. ? Microwave. This method uses high-energy microwaves to heat up the endometrium and remove it. ? Thermal balloon. This method involves inserting a catheter with a balloon tip into the uterus. The balloon tip is filled with heated fluid to remove the endometrium. The procedure may vary among health care providers and hospitals. What happens  after the procedure?  Your blood pressure, heart rate, breathing rate, and blood oxygen level will be monitored until the medicines you were given have worn off.  As tissue healing occurs, you may notice vaginal bleeding for 4-6 weeks after the procedure. You may also experience: ? Cramps. ? Thin, watery vaginal discharge that is light pink or brown in color. ? A need to urinate more frequently than usual. ? Nausea.  Do not drive for 24 hours if you were given a sedative.  Do not have sex or insert anything into your vagina until your health care provider approves. Summary  Endometrial ablation is done to treat the many causes of heavy menstrual bleeding.  The procedure may be done only after medications have been tried to control the bleeding.  Plan to have someone take you home from the hospital or clinic. This information is not intended to replace advice given to you by your health care provider. Make sure you discuss any questions you have with your health care provider. Document Released: 08/27/2004 Document Revised: 11/04/2016 Document Reviewed: 11/04/2016 Elsevier Interactive Patient Education  2017 [place], also called tubectomy, is the surgical removal of one of the fallopian tubes. The fallopian tubes are where eggs travel from the ovaries to the uterus. Removing one fallopian tube does not prevent you from becoming pregnant. It also does not cause problems with your menstrual periods. You may need a salpingectomy if you:  Have a fertilized egg that attaches to the fallopian tube (ectopic pregnancy), especially one that causes the tube to burst or tear (rupture).  Have an infected fallopian tube.  Have cancer of the fallopian tube or nearby organs.  Have had an ovary removed due to a cyst or tumor.  Have had your uterus removed.  There are three different methods that can be used for a salpingectomy:  Open. This method involves  making one large incision in your abdomen.  Laparoscopic. This method involves using a thin, lighted tube with a tiny camera on the end (laparoscope) to help perform the procedure. The laparoscope will allow your surgeon to make several small incisions in the abdomen instead of a large incision.  Robot-assisted: This method involves using a computer to control surgical instruments that are attached to robotic arms.  Tell a health care provider about:  Any allergies you have.  All medicines you are taking, including vitamins, herbs, eye drops, creams, and over-the-counter medicines.  Any problems you or family members have had with anesthetic medicines.  Any blood disorders you have.  Any surgeries you have had.  Any medical conditions you have.  Whether you are pregnant or may be pregnant. What are the risks? Generally, this is a safe procedure. However, problems may occur, including:  Infection.  Bleeding.  Allergic reactions to medicines.  Damage to other structures or organs.  Blood clots in the legs or lungs.  What happens before the procedure? Staying hydrated Follow instructions from your health care provider about hydration,  which may include:  Up to 2 hours before the procedure - you may continue to drink clear liquids, such as water, clear fruit juice, black coffee, and plain tea.  Eating and drinking restrictions Follow instructions from your health care provider about eating and drinking, which may include:  8 hours before the procedure - stop eating heavy meals or foods such as meat, fried foods, or fatty foods.  6 hours before the procedure - stop eating light meals or foods, such as toast or cereal.  6 hours before the procedure - stop drinking milk or drinks that contain milk.  2 hours before the procedure - stop drinking clear liquids.  Medicines  Ask your health care provider about: ? Changing or stopping your regular medicines. This is  especially important if you are taking diabetes medicines or blood thinners. ? Taking medicines such as aspirin and ibuprofen. These medicines can thin your blood. Do not take these medicines before your procedure if your health care provider instructs you not to.  You may be given antibiotic medicine to help prevent infection. General instructions  Do not smoke for at least 2 weeks before your procedure. If you need help quitting, ask your health care provider.  You may have an exam or tests, such as an electrocardiogram (ECG).  You may have a blood or urine sample taken.  Ask your health care provider: ? Whether you should stop removing hair from your surgical area. ? How your surgical site will be marked or identified.  You may be asked to shower with a germ-killing soap.  Plan to have someone take you home from the hospital or clinic.  If you will be going home right after the procedure, plan to have someone with you for 24 hours. What happens during the procedure?  To reduce your risk of infection: ? Your health care team will wash or sanitize their hands. ? Hair may be removed from the surgical area. ? Your skin will be washed with soap.  An IV tube will be inserted into one of your veins.  You will be given a medicine to make you fall asleep (general anesthetic). You may also be given a medicine to help you relax (sedative).  A thin tube (catheter) may be inserted through your urethra and into your bladder to drain urine during your procedure.  Depending on the type of procedure you are having, one incision or several small incisions will be made in your abdomen.  Your fallopian tube will be cut and removed from where it attaches to your uterus.  Your blood vessels will be clamped and tied to prevent excess bleeding.  The incision(s) in your abdomen will be closed with stitches (sutures), staples, or skin glue.  A bandage (dressing) may be placed over your  incision(s). The procedure may vary among health care providers and hospitals. What happens after the procedure?  Your blood pressure, heart rate, breathing rate, and blood oxygen level will be monitored until the medicines you were given have worn off.  You may continue to receive fluids and medicines through an IV tube.  You may continue to have a catheter draining your urine.  You may have to wear compression stockings. These stockings help to prevent blood clots and reduce swelling in your legs.  You will be given pain medicine as needed.  Do not drive for 24 hours if you received a sedative. Summary  Salpingectomy is a surgical procedure to remove one of the fallopian tubes.  The procedure may be done with an open incision, with a laparoscope, or with computer-controlled instruments.  Depending on the type of procedure you are having, one incision or several small incisions will be made in your abdomen.  Your blood pressure, heart rate, breathing rate, and blood oxygen level will be monitored until the medicines you were given have worn off.  Plan to have someone take you home from the hospital or clinic. This information is not intended to replace advice given to you by your health care provider. Make sure you discuss any questions you have with your health care provider. Document Released: 03/06/2009 Document Revised: 06/04/2016 Document Reviewed: 04/11/2013 Elsevier Interactive Patient Education  2018 [place] After This sheet gives you information about how to care for yourself after your procedure. Your health care provider may also give you more specific instructions. If you have problems or questions, contact your health care provider. What can I expect after the procedure? After your procedure, it is common to have:  Pain in your abdomen.  Some occasional vaginal bleeding (spotting).  Tiredness.  Follow these instructions at  home: Incision care   Keep your incision area and your bandage (dressing) clean and dry.  Follow instructions from your health care provider about how to take care of your incision. Make sure you: ? Wash your hands with soap and water before you change your dressing. If soap and water are not available, use hand sanitizer. ? Change your dressing astold by your health care provider. ? Leave stitches (sutures), staples, skin glue, or adhesive strips in place. These skin closures may need to stay in place for 2 weeks or longer. If adhesive strip edges start to loosen and curl up, you may trim the loose edges. Do not remove adhesive strips completely unless your health care provider tells you to do that.  Check your incision area every day for signs of infection. Check for: ? More redness, swelling, or pain. ? More fluid or blood. ? Warmth. ? Pus or a bad smell. Activity   Do not drive or use heavy machinery while taking prescription pain medicine.  Do not drive for 24 hours if you received a medicine to help you relax (sedative).  Rest as directed by your health care provider. Ask your health care provider what activities are safe for you. You should avoid: ? Lifting anything that is heavier than 10 lb (4.5 kg) until your health care provider approves. ? Activities that require a lot of energy.  Until your health care provider approves: ? Do not douche. ? Do not use tampons. ? Do not have sexual intercourse. General instructions  Take over-the-counter and prescription medicines only as told by your health care provider.  To prevent or treat constipation while you are taking prescription pain medicine, your health care provider may recommend that you: ? Drink enough fluid to keep your urine clear or pale yellow. ? Take over-the-counter or prescription medicines. ? Eat foods that are high in fiber, such as fresh fruits and vegetables, whole grains, and beans. ? Limit foods that are  high in fat and processed sugars, such as fried and sweet foods.  Do not take baths, swim, or use a hot tub until your health care provider approves. You may take showers.  Wear compression stockings as told by your health care provider. These stockings help to prevent blood clots and reduce swelling in your legs.  Keep all follow-up visits as told by your  health care provider. This is important. Contact a health care provider if:  You have: ? Pain when you urinate. ? More redness, swelling, or pain around your incision. ? More fluid or blood coming from your incision. ? Pus or a bad smell coming from your incision. ? A fever. ? Abdominal pain that gets worse or does not get better with medicine.  Your incision feels warm to the touch.  Your incision starts to break open.  You develop a rash.  You develop nausea and vomiting.  You feel light-headed. Get help right away if:  You develop pain in your chest or leg.  You develop shortness of breath.  You faint.  You have increased vaginal bleeding. This information is not intended to replace advice given to you by your health care provider. Make sure you discuss any questions you have with your health care provider. Document Released: 01/22/2011 Document Revised: 06/16/2016 Document Reviewed: 06/17/2016 Elsevier Interactive Patient Education  2018 [place] Anesthesia, Adult General anesthesia is the use of medicines to make a person "go to sleep" (be unconscious) for a medical procedure. General anesthesia is often recommended when a procedure:  Is long.  Requires you to be still or in an unusual position.  Is major and can cause you to lose blood.  Is impossible to do without general anesthesia.  The medicines used for general anesthesia are called general anesthetics. In addition to making you sleep, the medicines:  Prevent pain.  Control your blood pressure.  Relax your muscles.  Tell a health  care provider about:  Any allergies you have.  All medicines you are taking, including vitamins, herbs, eye drops, creams, and over-the-counter medicines.  Any problems you or family members have had with anesthetic medicines.  Types of anesthetics you have had in the past.  Any bleeding disorders you have.  Any surgeries you have had.  Any medical conditions you have.  Any history of heart or lung conditions, such as heart failure, sleep apnea, or chronic obstructive pulmonary disease (COPD).  Whether you are pregnant or may be pregnant.  Whether you use tobacco, alcohol, marijuana, or street drugs.  Any history of Armed forces logistics/support/administrative officer.  Any history of depression or anxiety. What are the risks? Generally, this is a safe procedure. However, problems may occur, including:  Allergic reaction to anesthetics.  Lung and heart problems.  Inhaling food or liquids from your stomach into your lungs (aspiration).  Injury to nerves.  Waking up during your procedure and being unable to move (rare).  Extreme agitation or a state of mental confusion (delirium) when you wake up from the anesthetic.  Air in the bloodstream, which can lead to stroke.  These problems are more likely to develop if you are having a major surgery or if you have an advanced medical condition. You can prevent some of these complications by answering all of your health care provider's questions thoroughly and by following all pre-procedure instructions. General anesthesia can cause side effects, including:  Nausea or vomiting  A sore throat from the breathing tube.  Feeling cold or shivery.  Feeling tired, washed out, or achy.  Sleepiness or drowsiness.  Confusion or agitation.  What happens before the procedure? Staying hydrated Follow instructions from your health care provider about hydration, which may include:  Up to 2 hours before the procedure - you may continue to drink clear liquids, such as  water, clear fruit juice, black coffee, and plain tea.  Eating and  drinking restrictions Follow instructions from your health care provider about eating and drinking, which may include:  8 hours before the procedure - stop eating heavy meals or foods such as meat, fried foods, or fatty foods.  6 hours before the procedure - stop eating light meals or foods, such as toast or cereal.  6 hours before the procedure - stop drinking milk or drinks that contain milk.  2 hours before the procedure - stop drinking clear liquids.  Medicines  Ask your health care provider about: ? Changing or stopping your regular medicines. This is especially important if you are taking diabetes medicines or blood thinners. ? Taking medicines such as aspirin and ibuprofen. These medicines can thin your blood. Do not take these medicines before your procedure if your health care provider instructs you not to. ? Taking new dietary supplements or medicines. Do not take these during the week before your procedure unless your health care provider approves them.  If you are told to take a medicine or to continue taking a medicine on the day of the procedure, take the medicine with sips of water. General instructions   Ask if you will be going home the same day, the following day, or after a longer hospital stay. ? Plan to have someone take you home. ? Plan to have someone stay with you for the first 24 hours after you leave the hospital or clinic.  For 3-6 weeks before the procedure, try not to use any tobacco products, such as cigarettes, chewing tobacco, and e-cigarettes.  You may brush your teeth on the morning of the procedure, but make sure to spit out the toothpaste. What happens during the procedure?  You will be given anesthetics through a mask and through an IV tube in one of your veins.  You may receive medicine to help you relax (sedative).  As soon as you are asleep, a breathing tube may be used to  help you breathe.  An anesthesia specialist will stay with you throughout the procedure. He or she will help keep you comfortable and safe by continuing to give you medicines and adjusting the amount of medicine that you get. He or she will also watch your blood pressure, pulse, and oxygen levels to make sure that the anesthetics do not cause any problems.  If a breathing tube was used to help you breathe, it will be removed before you wake up. The procedure may vary among health care providers and hospitals. What happens after the procedure?  You will wake up, often slowly, after the procedure is complete, usually in a recovery area.  Your blood pressure, heart rate, breathing rate, and blood oxygen level will be monitored until the medicines you were given have worn off.  You may be given medicine to help you calm down if you feel anxious or agitated.  If you will be going home the same day, your health care provider may check to make sure you can stand, drink, and urinate.  Your health care providers will treat your pain and side effects before you go home.  Do not drive for 24 hours if you received a sedative.  You may: ? Feel nauseous and vomit. ? Have a sore throat. ? Have mental slowness. ? Feel cold or shivery. ? Feel sleepy. ? Feel tired. ? Feel sore or achy, even in parts of your body where you did not have surgery. This information is not intended to replace advice given to you by your  health care provider. Make sure you discuss any questions you have with your health care provider. Document Released: 01/25/2008 Document Revised: 03/30/2016 Document Reviewed: 10/02/2015 Elsevier Interactive Patient Education  2018 [place] Anesthesia, Adult, Care After These instructions provide you with information about caring for yourself after your procedure. Your health care provider may also give you more specific instructions. Your treatment has been planned according  to current medical practices, but problems sometimes occur. Call your health care provider if you have any problems or questions after your procedure. What can I expect after the procedure? After the procedure, it is common to have:  Vomiting.  A sore throat.  Mental slowness.  It is common to feel:  Nauseous.  Cold or shivery.  Sleepy.  Tired.  Sore or achy, even in parts of your body where you did not have surgery.  Follow these instructions at home: For at least 24 hours after the procedure:  Do not: ? Participate in activities where you could fall or become injured. ? Drive. ? Use heavy machinery. ? Drink alcohol. ? Take sleeping pills or medicines that cause drowsiness. ? Make important decisions or sign legal documents. ? Take care of children on your own.  Rest. Eating and drinking  If you vomit, drink water, juice, or soup when you can drink without vomiting.  Drink enough fluid to keep your urine clear or pale yellow.  Make sure you have little or no nausea before eating solid foods.  Follow the diet recommended by your health care provider. General instructions  Have a responsible adult stay with you until you are awake and alert.  Return to your normal activities as told by your health care provider. Ask your health care provider what activities are safe for you.  Take over-the-counter and prescription medicines only as told by your health care provider.  If you smoke, do not smoke without supervision.  Keep all follow-up visits as told by your health care provider. This is important. Contact a health care provider if:  You continue to have nausea or vomiting at home, and medicines are not helpful.  You cannot drink fluids or start eating again.  You cannot urinate after 8-12 hours.  You develop a skin rash.  You have fever.  You have increasing redness at the site of your procedure. Get help right away if:  You have difficulty  breathing.  You have chest pain.  You have unexpected bleeding.  You feel that you are having a life-threatening or urgent problem. This information is not intended to replace advice given to you by your health care provider. Make sure you discuss any questions you have with your health care provider. Document Released: 01/24/2001 Document Revised: 03/22/2016 Document Reviewed: 10/02/2015 Elsevier Interactive Patient Education  [employer].

## 2017-11-01 ENCOUNTER — Other Ambulatory Visit: Payer: Self-pay | Admitting: Obstetrics and Gynecology

## 2017-11-01 NOTE — Progress Notes (Signed)
Patient ID: [name], female   DOB: 10/04/1971, 47 y.o.   MRN: [id number]   [facility] Visit  @DATE @            Patient name: Judy Patton         MRN [id number]  Date of birth: 31-Oct-1971  CC & HPI:  Judy Patton is a 47 y.o. female presenting today to discuss surgical options for bleeding issues and moderate abdominal cramping. She experiences irregular menstrual cycles since this past summer. She had a PE about 2 years ago and had to stop taking birth control. She was on birth control for about 22 years. She is on life time anticoagulants. Since starting Xarelto she has been passing blood clots. Her cramps have gotten worse and she states that she needs to take ibuprofen for pain, even though she had been advised not to take it. She notes tylenol does not help her pain. Since she is not able to take birth control she would like to have her tubes tied or removed. The patient denies fever, chills, or any other complaints at this time.   Procedure requested: 1. Laparoscopic Bilateral Salpingectomy                                          2. Hystereoscopy, /Dilation and Curettage,   Endometrial ablation ROS:  ROS +menorrhagia  +blood clots -fever -chills All systems are negative except as noted in the HPI and PMH.   Pertinent History Reviewed:   Reviewed: Significant for PE,  Medical             Past Medical History:  Diagnosis Date  . Chronic headaches   . Depression   . Factor V Leiden mutation ([place])   . Fatigue 02/18/2015  . Hypothyroidism   . Kidney stones   . MRSA (methicillin resistant Staphylococcus aureus)   . Pain of tooth socket 03/14/2015  . Pulmonary embolus ([place])   . Restless leg syndrome   . Shingles rash 03/14/2015  . Shoulder injury   . Stress    family  . Weight gain 02/18/2015                              Surgical Hx:         Past Surgical History:  Procedure Laterality Date  . COLONOSCOPY  03/25/2004   [facility]J:JKKX canal hemorrhoids,  otherwise normal rectum,  . ESOPHAGOGASTRODUODENOSCOPY  03/25/2004   [facility]:WEXHBZ esophagus, small hiatal hernia, otherwise normal stomach  . kidney stones  05/2014,11/2014  . LITHOTRIPSY  2008   Medications: Reviewed & Updated - see associated section                       Current Outpatient Medications:  .  levothyroxine (SYNTHROID, LEVOTHROID) 88 MCG tablet, TAKE ONE TABLET BY MOUTH ONE TIME DAILY, Disp: 30 tablet, Rfl: 9 .  rOPINIRole (REQUIP) 2 MG tablet, TAKE ONE TABLET BY MOUTH AT BEDTIME, Disp: 30 tablet, Rfl: 9 .  XARELTO 20 MG TABS tablet, TAKE 1 TABLET EVERY DAY WITH FOOD, Disp: , Rfl: 2 .  sertraline (ZOLOFT) 50 MG tablet, Take one for PMS as needed (Patient not taking: Reported on 10/17/2017), Disp: 30 tablet, Rfl: 3   Social History: Reviewed -  reports that  has never  smoked. she has never used smokeless tobacco.  Objective Findings:  Vitals: Blood pressure 130/80, pulse 98, height 5\' 5"  (1.651 m), weight 194 lb 3.2 oz (88.1 kg), last menstrual period 10/12/2017.  PHYSICAL EXAMINATION General appearance - alert, well appearing, and in no distress, oriented to person, place, and time and overweight Mental status - alert, oriented to person, place, and time, normal mood, behavior, speech, dress, motor activity, and thought processes, affect appropriate to mood  PELVIC External genitalia - normal Vulva - normal Vagina - light pink discharge Cervix - good support  Uterus - good support, anti-flexed, arcuate  Adnexa - normal  Discussion: 1. Discussed with pt risks and benefits of endometrial ablation vs hysterectomy.    At end of discussion, pt had opportunity to ask questions and has no further questions at this time.   Specific discussion of surgery options as noted above. Greater than 50% was spent in counseling and coordination of care with the patient.   Total time greater than: 25 minutes.    Assessment & Plan:   A:  1. Menrrhagia and  dysmenorrhea on xalerto 2. Desire for elective permanent sterilization. P:  1. D&C, Endometrial Ablation, bilateral salpingectomy on 11/08/2017 2. F/U post op check 2 weeks after surgery    By signing my name below, I, Judy Patton, attest that this documentation has been prepared under the direction and in the presence of Jonnie Kind, MD. Electronically Signed: Margit Patton, Medical Scribe. 10/17/17. 10:48 AM.  I personally performed the services described in this documentation, which was SCRIBED in my presence. The recorded information has been reviewed and considered accurate. It has been edited as necessary during review. Jonnie Kind, MD

## 2017-11-02 ENCOUNTER — Encounter (HOSPITAL_COMMUNITY)
Admission: RE | Admit: 2017-11-02 | Discharge: 2017-11-02 | Disposition: A | Discharge status: Home / Self Care | Payer: BC Managed Care – PPO | Source: Ambulatory Visit | Attending: Obstetrics and Gynecology | Admitting: Obstetrics and Gynecology

## 2017-11-02 ENCOUNTER — Telehealth: Payer: Self-pay | Admitting: *Deleted

## 2017-11-02 ENCOUNTER — Other Ambulatory Visit: Payer: Self-pay

## 2017-11-02 ENCOUNTER — Encounter (HOSPITAL_COMMUNITY): Payer: Self-pay

## 2017-11-02 DIAGNOSIS — Z01812 Encounter for preprocedural laboratory examination: Secondary | ICD-10-CM | POA: Diagnosis not present

## 2017-11-02 HISTORY — DX: Anxiety disorder, unspecified: F41.9

## 2017-11-02 HISTORY — DX: Other specified postprocedural states: Z98.890

## 2017-11-02 HISTORY — DX: Other specified postprocedural states: R11.2

## 2017-11-02 HISTORY — DX: Personal history of urinary calculi: Z87.442

## 2017-11-02 LAB — URINALYSIS, ROUTINE W REFLEX MICROSCOPIC
Bacteria, UA: NONE SEEN
Bilirubin Urine: NEGATIVE
GLUCOSE, UA: NEGATIVE mg/dL
Ketones, ur: NEGATIVE mg/dL
Leukocytes, UA: NEGATIVE
NITRITE: NEGATIVE
Protein, ur: NEGATIVE mg/dL
SPECIFIC GRAVITY, URINE: 1.027 (ref 1.005–1.030)
pH: 5 (ref 5.0–8.0)

## 2017-11-02 LAB — BASIC METABOLIC PANEL
Anion gap: 10 (ref 5–15)
BUN: 13 mg/dL (ref 6–20)
CO2: 24 mmol/L (ref 22–32)
CREATININE: 0.71 mg/dL (ref 0.44–1.00)
Calcium: 8.8 mg/dL — ABNORMAL LOW (ref 8.9–10.3)
Chloride: 105 mmol/L (ref 101–111)
Glucose, Bld: 103 mg/dL — ABNORMAL HIGH (ref 65–99)
POTASSIUM: 3.8 mmol/L (ref 3.5–5.1)
SODIUM: 139 mmol/L (ref 135–145)

## 2017-11-02 LAB — CBC
HEMATOCRIT: 41.6 % (ref 36.0–46.0)
Hemoglobin: 13.2 g/dL (ref 12.0–15.0)
MCH: 30 pg (ref 26.0–34.0)
MCHC: 31.7 g/dL (ref 30.0–36.0)
MCV: 94.5 fL (ref 78.0–100.0)
PLATELETS: 291 10*3/uL (ref 150–400)
RBC: 4.4 MIL/uL (ref 3.87–5.11)
RDW: 13.2 % (ref 11.5–15.5)
WBC: 12 10*3/uL — AB (ref 4.0–10.5)

## 2017-11-02 LAB — HCG, SERUM, QUALITATIVE: Preg, Serum: NEGATIVE

## 2017-11-02 NOTE — Telephone Encounter (Signed)
Informed patient she will be out of work for about a week but at least until she is assessed and cleared at her post-op appointment. Will get letter faxed to employer.

## 2017-11-02 NOTE — Pre-Procedure Instructions (Signed)
Patient on Xarelto for hx of PE. Called Dr Glo Herring and verified that patient is to have last dose of xarelto on 11/06/2017 prior to surgery. Verbally instructed patient on this and she verbalizes understanding.

## 2017-11-07 ENCOUNTER — Telehealth: Payer: Self-pay | Admitting: Obstetrics and Gynecology

## 2017-11-07 NOTE — Telephone Encounter (Signed)
Informed patient it was fine that she was on her period. Can proceed with surgery tomorrow. Verbalized understanding.

## 2017-11-08 ENCOUNTER — Ambulatory Visit (HOSPITAL_COMMUNITY): Payer: BC Managed Care – PPO | Admitting: Anesthesiology

## 2017-11-08 ENCOUNTER — Encounter (HOSPITAL_COMMUNITY)
Admission: RE | Disposition: A | Discharge status: Home / Self Care | Payer: Self-pay | Source: Ambulatory Visit | Attending: Obstetrics and Gynecology

## 2017-11-08 ENCOUNTER — Ambulatory Visit (HOSPITAL_COMMUNITY)
Admission: RE | Admit: 2017-11-08 | Discharge: 2017-11-08 | Disposition: A | Discharge status: Home / Self Care | Payer: BC Managed Care – PPO | Source: Ambulatory Visit | Attending: Obstetrics and Gynecology | Admitting: Obstetrics and Gynecology

## 2017-11-08 ENCOUNTER — Encounter (HOSPITAL_COMMUNITY): Payer: Self-pay

## 2017-11-08 DIAGNOSIS — E039 Hypothyroidism, unspecified: Secondary | ICD-10-CM | POA: Diagnosis not present

## 2017-11-08 DIAGNOSIS — R51 Headache: Secondary | ICD-10-CM | POA: Insufficient documentation

## 2017-11-08 DIAGNOSIS — N920 Excessive and frequent menstruation with regular cycle: Secondary | ICD-10-CM | POA: Diagnosis not present

## 2017-11-08 DIAGNOSIS — Z79899 Other long term (current) drug therapy: Secondary | ICD-10-CM | POA: Insufficient documentation

## 2017-11-08 DIAGNOSIS — F329 Major depressive disorder, single episode, unspecified: Secondary | ICD-10-CM | POA: Insufficient documentation

## 2017-11-08 DIAGNOSIS — G2581 Restless legs syndrome: Secondary | ICD-10-CM | POA: Insufficient documentation

## 2017-11-08 DIAGNOSIS — Z302 Encounter for sterilization: Secondary | ICD-10-CM | POA: Insufficient documentation

## 2017-11-08 DIAGNOSIS — D6851 Activated protein C resistance: Secondary | ICD-10-CM | POA: Diagnosis not present

## 2017-11-08 DIAGNOSIS — Z8614 Personal history of Methicillin resistant Staphylococcus aureus infection: Secondary | ICD-10-CM | POA: Diagnosis not present

## 2017-11-08 DIAGNOSIS — K449 Diaphragmatic hernia without obstruction or gangrene: Secondary | ICD-10-CM | POA: Insufficient documentation

## 2017-11-08 DIAGNOSIS — Z86711 Personal history of pulmonary embolism: Secondary | ICD-10-CM | POA: Diagnosis not present

## 2017-11-08 DIAGNOSIS — Z4003 Encounter for prophylactic removal of fallopian tube(s): Secondary | ICD-10-CM | POA: Diagnosis not present

## 2017-11-08 DIAGNOSIS — Z7901 Long term (current) use of anticoagulants: Secondary | ICD-10-CM | POA: Insufficient documentation

## 2017-11-08 DIAGNOSIS — N838 Other noninflammatory disorders of ovary, fallopian tube and broad ligament: Secondary | ICD-10-CM | POA: Insufficient documentation

## 2017-11-08 DIAGNOSIS — N946 Dysmenorrhea, unspecified: Secondary | ICD-10-CM | POA: Insufficient documentation

## 2017-11-08 DIAGNOSIS — Z87442 Personal history of urinary calculi: Secondary | ICD-10-CM | POA: Insufficient documentation

## 2017-11-08 HISTORY — PX: LAPAROSCOPIC BILATERAL SALPINGECTOMY: SHX5889

## 2017-11-08 HISTORY — PX: DILITATION & CURRETTAGE/HYSTROSCOPY WITH NOVASURE ABLATION: SHX5568

## 2017-11-08 SURGERY — DILATATION & CURETTAGE/HYSTEROSCOPY WITH NOVASURE ABLATION
Anesthesia: General | Site: Vagina

## 2017-11-08 MED ORDER — SODIUM CHLORIDE 0.9 % IR SOLN
Status: DC | PRN
  Administered 2017-11-08: 3000 mL

## 2017-11-08 MED ORDER — BUPIVACAINE-EPINEPHRINE (PF) 0.5% -1:200000 IJ SOLN
INTRAMUSCULAR | Status: DC | PRN
  Administered 2017-11-08: 20 mL via PERINEURAL

## 2017-11-08 MED ORDER — ONDANSETRON HCL 4 MG/2ML IJ SOLN
4.0000 mg | Freq: Once | INTRAMUSCULAR | Status: AC
  Administered 2017-11-08: 4 mg via INTRAVENOUS

## 2017-11-08 MED ORDER — SUGAMMADEX SODIUM 200 MG/2ML IV SOLN
INTRAVENOUS | Status: DC | PRN
  Administered 2017-11-08: 181.4 mg via INTRAVENOUS

## 2017-11-08 MED ORDER — FENTANYL CITRATE (PF) 100 MCG/2ML IJ SOLN
INTRAMUSCULAR | Status: AC
  Filled 2017-11-08: qty 2

## 2017-11-08 MED ORDER — BUPIVACAINE-EPINEPHRINE (PF) 0.5% -1:200000 IJ SOLN
INTRAMUSCULAR | Status: AC
  Filled 2017-11-08: qty 30

## 2017-11-08 MED ORDER — DEXAMETHASONE SODIUM PHOSPHATE 4 MG/ML IJ SOLN
4.0000 mg | Freq: Once | INTRAMUSCULAR | Status: AC
  Administered 2017-11-08: 4 mg via INTRAVENOUS

## 2017-11-08 MED ORDER — CEFAZOLIN SODIUM-DEXTROSE 2-4 GM/100ML-% IV SOLN
2.0000 g | INTRAVENOUS | Status: AC
  Administered 2017-11-08: 2 g via INTRAVENOUS
  Filled 2017-11-08: qty 100

## 2017-11-08 MED ORDER — LACTATED RINGERS IV SOLN
INTRAVENOUS | Status: DC
  Administered 2017-11-08: 12:00:00 via INTRAVENOUS

## 2017-11-08 MED ORDER — 0.9 % SODIUM CHLORIDE (POUR BTL) OPTIME
TOPICAL | Status: DC | PRN
  Administered 2017-11-08: 1000 mL

## 2017-11-08 MED ORDER — ROCURONIUM BROMIDE 100 MG/10ML IV SOLN
INTRAVENOUS | Status: DC | PRN
  Administered 2017-11-08: 10 mg via INTRAVENOUS
  Administered 2017-11-08: 40 mg via INTRAVENOUS

## 2017-11-08 MED ORDER — ONDANSETRON HCL 4 MG/2ML IJ SOLN
INTRAMUSCULAR | Status: AC
  Filled 2017-11-08: qty 2

## 2017-11-08 MED ORDER — OXYCODONE-ACETAMINOPHEN 5-325 MG PO TABS: 1.0000 | ORAL_TABLET | ORAL | 0 refills | Status: DC | PRN

## 2017-11-08 MED ORDER — DEXAMETHASONE SODIUM PHOSPHATE 4 MG/ML IJ SOLN
INTRAMUSCULAR | Status: AC
  Filled 2017-11-08: qty 1

## 2017-11-08 MED ORDER — MIDAZOLAM HCL 2 MG/2ML IJ SOLN
INTRAMUSCULAR | Status: AC
  Filled 2017-11-08: qty 2

## 2017-11-08 MED ORDER — SCOPOLAMINE 1 MG/3DAYS TD PT72
1.0000 | MEDICATED_PATCH | Freq: Once | TRANSDERMAL | Status: DC
  Administered 2017-11-08: 1.5 mg via TRANSDERMAL

## 2017-11-08 MED ORDER — MIDAZOLAM HCL 2 MG/2ML IJ SOLN
1.0000 mg | INTRAMUSCULAR | Status: AC
  Administered 2017-11-08: 2 mg via INTRAVENOUS

## 2017-11-08 MED ORDER — SCOPOLAMINE 1 MG/3DAYS TD PT72
MEDICATED_PATCH | TRANSDERMAL | Status: AC
  Filled 2017-11-08: qty 1

## 2017-11-08 MED ORDER — FENTANYL CITRATE (PF) 100 MCG/2ML IJ SOLN: 25.0000 ug | INTRAMUSCULAR | Status: DC | PRN

## 2017-11-08 MED ORDER — FENTANYL CITRATE (PF) 100 MCG/2ML IJ SOLN
INTRAMUSCULAR | Status: DC | PRN
  Administered 2017-11-08 (×5): 50 ug via INTRAVENOUS

## 2017-11-08 MED ORDER — PROPOFOL 10 MG/ML IV BOLUS
INTRAVENOUS | Status: DC | PRN
  Administered 2017-11-08: 50 mg via INTRAVENOUS
  Administered 2017-11-08: 150 mg via INTRAVENOUS

## 2017-11-08 MED ORDER — LIDOCAINE HCL (CARDIAC) 20 MG/ML IV SOLN
INTRAVENOUS | Status: DC | PRN
  Administered 2017-11-08: 30 mg via INTRAVENOUS

## 2017-11-08 SURGICAL SUPPLY — 48 items
ABLATOR ENDOMETRIAL BIPOLAR (ABLATOR) ×4 IMPLANT
APL SKNCLS STERI-STRIP NONHPOA (GAUZE/BANDAGES/DRESSINGS) ×2
BAG HAMPER (MISCELLANEOUS) ×4 IMPLANT
BANDAGE STRIP 1X3 FLEXIBLE (GAUZE/BANDAGES/DRESSINGS) ×8 IMPLANT
BENZOIN TINCTURE PRP APPL 2/3 (GAUZE/BANDAGES/DRESSINGS) ×2 IMPLANT
BLADE SURG SZ11 CARB STEEL (BLADE) ×4 IMPLANT
CATH ROBINSON RED A/P 16FR (CATHETERS) ×4 IMPLANT
CLOSURE STERI-STRIP 1/4X4 (GAUZE/BANDAGES/DRESSINGS) ×4 IMPLANT
CLOSURE WOUND 1/4 X3 (GAUZE/BANDAGES/DRESSINGS) ×1
CLOTH BEACON ORANGE TIMEOUT ST (SAFETY) ×4 IMPLANT
COVER LIGHT HANDLE STERIS (MISCELLANEOUS) ×8 IMPLANT
DECANTER SPIKE VIAL GLASS SM (MISCELLANEOUS) ×4 IMPLANT
DURAPREP 26ML APPLICATOR (WOUND CARE) ×4 IMPLANT
ELECT REM PT RETURN 9FT ADLT (ELECTROSURGICAL) ×4
ELECTRODE REM PT RTRN 9FT ADLT (ELECTROSURGICAL) ×2 IMPLANT
FORMALIN 10 PREFIL 120ML (MISCELLANEOUS) ×4 IMPLANT
GLOVE BIO SURGEON STRL SZ7 (GLOVE) ×2 IMPLANT
GLOVE BIOGEL PI IND STRL 7.0 (GLOVE) ×2 IMPLANT
GLOVE BIOGEL PI IND STRL 9 (GLOVE) ×2 IMPLANT
GLOVE BIOGEL PI INDICATOR 7.0 (GLOVE) ×6
GLOVE BIOGEL PI INDICATOR 9 (GLOVE) ×2
GLOVE ECLIPSE 9.0 STRL (GLOVE) ×8 IMPLANT
GOWN SPEC L3 XXLG W/TWL (GOWN DISPOSABLE) ×4 IMPLANT
GOWN STRL REUS W/TWL LRG LVL3 (GOWN DISPOSABLE) ×4 IMPLANT
INST SET HYSTEROSCOPY (KITS) ×4 IMPLANT
INST SET LAPROSCOPIC GYN AP (KITS) ×4 IMPLANT
IV NS IRRIG 3000ML ARTHROMATIC (IV SOLUTION) ×4 IMPLANT
KIT ROOM TURNOVER AP CYSTO (KITS) ×4 IMPLANT
KIT ROOM TURNOVER APOR (KITS) ×4 IMPLANT
MANIFOLD NEPTUNE II (INSTRUMENTS) ×4 IMPLANT
NEEDLE INSUFFLATION 120MM (ENDOMECHANICALS) ×4 IMPLANT
NS IRRIG 1000ML POUR BTL (IV SOLUTION) ×4 IMPLANT
PACK PERI GYN (CUSTOM PROCEDURE TRAY) ×4 IMPLANT
PAD ARMBOARD 7.5X6 YLW CONV (MISCELLANEOUS) ×4 IMPLANT
PAD TELFA 3X4 1S STER (GAUZE/BANDAGES/DRESSINGS) ×4 IMPLANT
SET BASIN LINEN APH (SET/KITS/TRAYS/PACK) ×4 IMPLANT
SET IRRIG Y TYPE TUR BLADDER L (SET/KITS/TRAYS/PACK) ×4 IMPLANT
SHEARS HARMONIC ACE PLUS 36CM (ENDOMECHANICALS) ×4 IMPLANT
SLEEVE ENDOPATH XCEL 5M (ENDOMECHANICALS) ×8 IMPLANT
SOLUTION ANTI FOG 6CC (MISCELLANEOUS) ×4 IMPLANT
STRIP CLOSURE SKIN 1/4X3 (GAUZE/BANDAGES/DRESSINGS) ×1 IMPLANT
SUT VIC AB 4-0 PS2 27 (SUTURE) ×4 IMPLANT
SYR 30ML LL (SYRINGE) ×4 IMPLANT
SYR CONTROL 10ML LL (SYRINGE) ×4 IMPLANT
SYRINGE 10CC LL (SYRINGE) ×4 IMPLANT
TROCAR XCEL NON-BLD 5MMX100MML (ENDOMECHANICALS) ×4 IMPLANT
TUBING INSUFFLATION (TUBING) ×4 IMPLANT
WARMER LAPAROSCOPE (MISCELLANEOUS) ×4 IMPLANT

## 2017-11-08 NOTE — H&P (Signed)
CC & HPI:  Judy Patton is a 47 y.o. female presenting today to discuss surgical options for bleeding issues and moderate abdominal cramping. She experiences irregular menstrual cycles since this past summer. She had a PE about 2 years ago and had to stop taking birth control. She was on birth control for about 22 years. She is on life time anticoagulants. Since starting Xarelto she has been passing blood clots. Her cramps have gotten worse and she states that she needs to take ibuprofen for pain, even though she had been advised not to take it. She notes tylenol does not help her pain. Since she is not able to take birth control she would like to have her tubes tied or removed. The patient denies fever, chills, or any other complaints at this time.   Procedure requested: 1. Laparoscopic Bilateral Salpingectomy                                          2. Hystereoscopy, /Dilation and Curettage,   Endometrial ablation ROS:  ROS +menorrhagia  +blood clots -fever -chills All systems are negative except as noted in the HPI and PMH.   Pertinent History Reviewed:   Reviewed: Significant for PE,  Medical             Past Medical History:  Diagnosis Date  . Chronic headaches   . Depression   . Factor V Leiden mutation ([place])   . Fatigue 02/18/2015  . Hypothyroidism   . Kidney stones   . MRSA (methicillin resistant Staphylococcus aureus)   . Pain of tooth socket 03/14/2015  . Pulmonary embolus ([place])   . Restless leg syndrome   . Shingles rash 03/14/2015  . Shoulder injury   . Stress    family  . Weight gain 02/18/2015                              Surgical Hx:         Past Surgical History:  Procedure Laterality Date  . COLONOSCOPY  03/25/2004   JKK:XFGH canal hemorrhoids, otherwise normal rectum,  . ESOPHAGOGASTRODUODENOSCOPY  03/25/2004   WEX:HBZJIR esophagus, small hiatal hernia, otherwise normal stomach  . kidney stones  05/2014,11/2014  . LITHOTRIPSY  2008    Medications: Reviewed & Updated - see associated section                       Current Outpatient Medications:  .  levothyroxine (SYNTHROID, LEVOTHROID) 88 MCG tablet, TAKE ONE TABLET BY MOUTH ONE TIME DAILY, Disp: 30 tablet, Rfl: 9 .  rOPINIRole (REQUIP) 2 MG tablet, TAKE ONE TABLET BY MOUTH AT BEDTIME, Disp: 30 tablet, Rfl: 9 .  XARELTO 20 MG TABS tablet, TAKE 1 TABLET EVERY DAY WITH FOOD, Disp: , Rfl: 2 .  sertraline (ZOLOFT) 50 MG tablet, Take one for PMS as needed (Patient not taking: Reported on 10/17/2017), Disp: 30 tablet, Rfl: 3   Social History: Reviewed -  reports that  has never smoked. she has never used smokeless tobacco.  Objective Findings:  Vitals: Blood pressure 130/80, pulse 98, height 5\' 5"  (1.651 m), weight 194 lb 3.2 oz (88.1 kg), last menstrual period 10/12/2017.  PHYSICAL EXAMINATION General appearance - alert, well appearing, and in no distress, oriented to person, place, and time and overweight Mental status -  alert, oriented to person, place, and time, normal mood, behavior, speech, dress, motor activity, and thought processes, affect appropriate to mood  PELVIC External genitalia - normal Vulva - normal Vagina - light pink discharge Cervix - good support  Uterus - good support, anti-flexed, arcuate  Adnexa - normal  Discussion: 1. Discussed with pt risks and benefits of endometrial ablation vs hysterectomy.    At end of discussion, pt had opportunity to ask questions and has no further questions at this time.   Specific discussion of surgery options as noted above. Greater than 50% was spent in counseling and coordination of care with the patient.   CBC    Component Value Date/Time   WBC 12.0 (H) 11/02/2017 1453   RBC 4.40 11/02/2017 1453   HGB 13.2 11/02/2017 1453   HCT 41.6 11/02/2017 1453   PLT 291 11/02/2017 1453   MCV 94.5 11/02/2017 1453   MCH 30.0 11/02/2017 1453   MCHC 31.7 11/02/2017 1453   RDW 13.2 11/02/2017 1453   RDW  13.4 02/18/2015 1527   LYMPHSABS 2.9 08/07/2013 1546   MONOABS 1.0 08/07/2013 1546   EOSABS 0.3 08/07/2013 1546   BASOSABS 0.1 08/07/2013 1546   CMP Latest Ref Rng & Units 11/02/2017 02/18/2015 12/26/2013  Glucose 65 - 99 mg/dL 103(H) 87 76  BUN 6 - 20 mg/dL 13 9 10   Creatinine 0.44 - 1.00 mg/dL 0.71 0.56(L) 0.53  Sodium 135 - 145 mmol/L 139 141 137  Potassium 3.5 - 5.1 mmol/L 3.8 3.8 3.8  Chloride 101 - 111 mmol/L 105 102 102  CO2 22 - 32 mmol/L 24 25 24   Calcium 8.9 - 10.3 mg/dL 8.8(L) 9.1 8.9  Total Protein 6.0 - 8.5 g/dL - 6.8 -  Total Bilirubin 0.0 - 1.2 mg/dL - 0.2 -  Alkaline Phos 39 - 117 IU/L - 134(H) -  AST 0 - 40 IU/L - 14 -  ALT 0 - 32 IU/L - 11 -      Assessment & Plan:   A:  1. Menrrhagia and dysmenorrhea on xarelto, last dose 2 d ago 2. Desire for elective permanent sterilization. P:  1. D&C, Endometrial Ablation, bilateral salpingectomy on 11/08/2017 2. F/U post op check 2 weeks after surgery    By signing my name below, I, Margit Banda, attest that this documentation has been prepared under the direction and in the presence of Jonnie Kind, MD. Electronically Signed: Margit Banda, Medical Scribe. 10/17/17. 10:48 AM.  I personally performed the services described in this documentation, which was SCRIBED in my presence. The recorded information has been reviewed and considered accurate. It has been edited as necessary during review. Jonnie Kind, MD

## 2017-11-08 NOTE — Op Note (Signed)
Please see the brief operative note for surgical details 

## 2017-11-08 NOTE — Anesthesia Postprocedure Evaluation (Signed)
Anesthesia Post Note Late Entry for 1450  Patient: Judy Patton [facility]  Procedure(s) Performed: DILATATION & CURETTAGE/HYSTEROSCOPY WITH NOVASURE ENDOMETRIAL ABLATION (N/A Vagina ) LAPAROSCOPIC BILATERAL SALPINGECTOMY (Bilateral Abdomen)  Patient location during evaluation: PACU Anesthesia Type: General Level of consciousness: awake and alert, oriented and patient cooperative Pain management: pain level controlled Vital Signs Assessment: post-procedure vital signs reviewed and stable Respiratory status: spontaneous breathing Cardiovascular status: stable Postop Assessment: no apparent nausea or vomiting Anesthetic complications: no     Last Vitals:  Vitals:   11/08/17 1500 11/08/17 1515  BP: 120/77 118/82  Pulse: 87 72  Resp: 15 13  Temp:    SpO2: 94% 95%    Last Pain:  Vitals:   11/08/17 1117  TempSrc: Oral                 Penny Arrambide A

## 2017-11-08 NOTE — Anesthesia Procedure Notes (Signed)
Procedure Name: Intubation Date/Time: 11/08/2017 1:21 PM Performed by: Andree Elk, Elide Stalzer A, CRNA Pre-anesthesia Checklist: Patient identified, Patient being monitored, Timeout performed, Emergency Drugs available and Suction available Patient Re-evaluated:Patient Re-evaluated prior to induction Oxygen Delivery Method: Circle System Utilized Preoxygenation: Pre-oxygenation with 100% oxygen Induction Type: IV induction Ventilation: Mask ventilation without difficulty Laryngoscope Size: Mac and 3 Grade View: Grade I Tube type: Oral Tube size: 7.0 mm Number of attempts: 1 Airway Equipment and Method: Stylet Placement Confirmation: ETT inserted through vocal cords under direct vision,  positive ETCO2 and breath sounds checked- equal and bilateral Secured at: 21 cm Tube secured with: Tape Dental Injury: Teeth and Oropharynx as per pre-operative assessment

## 2017-11-08 NOTE — Discharge Instructions (Signed)
Dilation and Curettage or Vacuum Curettage, Care After These instructions give you information about caring for yourself after your procedure. Your doctor may also give you more specific instructions. Call your doctor if you have any problems or questions after your procedure. Follow these instructions at home: Activity  Do not drive or use heavy machinery while taking prescription pain medicine.  For 24 hours after your procedure, avoid driving.  Take short walks often, followed by rest periods. Ask your doctor what activities are safe for you. After one or two days, you may be able to return to your normal activities.  Do not lift anything that is heavier than 10 lb (4.5 kg) until your doctor approves.  For at least 2 weeks, or as long as told by your doctor: ? Do not douche. ? Do not use tampons. ? Do not have sex. General instructions  Take over-the-counter and prescription medicines only as told by your doctor. This is very important if you take blood thinning medicine.  Do not take baths, swim, or use a hot tub until your doctor approves. Take showers instead of baths.  Wear compression stockings as told by your doctor.  It is up to you to get the results of your procedure. Ask your doctor when your results will be ready.  Keep all follow-up visits as told by your doctor. This is important. Contact a doctor if:  You have very bad cramps that get worse or do not get better with medicine.  You have very bad pain in your belly (abdomen).  You cannot drink fluids without throwing up (vomiting).  You get pain in a different part of the area between your belly and thighs (pelvis).  You have bad-smelling discharge from your vagina.  You have a rash. Get help right away if:  You are bleeding a lot from your vagina. A lot of bleeding means soaking more than one sanitary pad in an hour, for 2 hours in a row.  You have clumps of blood (blood clots) coming from your  vagina.  You have a fever or chills.  Your belly feels very tender or hard.  You have chest pain.  You have trouble breathing.  You cough up blood.  You feel dizzy.  You feel light-headed.  You pass out (faint).  You have pain in your neck or shoulder area. Summary  Take short walks often, followed by rest periods. Ask your doctor what activities are safe for you. After one or two days, you may be able to return to your normal activities.  Do not lift anything that is heavier than 10 lb (4.5 kg) until your doctor approves.  Do not take baths, swim, or use a hot tub until your doctor approves. Take showers instead of baths.  Contact your doctor if you have any symptoms of infection, like bad-smelling discharge from your vagina. This information is not intended to replace advice given to you by your health care provider. Make sure you discuss any questions you have with your health care provider. Document Released: 07/27/2008 Document Revised: 07/05/2016 Document Reviewed: 07/05/2016 Elsevier Interactive Patient Education  2017 [place], taking dose later today at 6 PM, then resume your normal schedule for Xarelto tomorrow  PATIENT INSTRUCTIONS POST-ANESTHESIA  IMMEDIATELY FOLLOWING SURGERY:  Do not drive or operate machinery for the first twenty four hours after surgery.  Do not make any important decisions for twenty four hours after surgery or while taking narcotic pain medications or  sedatives.  If you develop intractable nausea and vomiting or a severe headache please notify your doctor immediately.  FOLLOW-UP:  Please make an appointment with your surgeon as instructed. You do not need to follow up with anesthesia unless specifically instructed to do so.  WOUND CARE INSTRUCTIONS (if applicable):  Keep a dry clean dressing on the anesthesia/puncture wound site if there is drainage.  Once the wound has quit draining you may leave it open to air.   Generally you should leave the bandage intact for twenty four hours unless there is drainage.  If the epidural site drains for more than 36-48 hours please call the anesthesia department.  QUESTIONS?:  Please feel free to call your physician or the hospital operator if you have any questions, and they will be happy to assist you.

## 2017-11-08 NOTE — Transfer of Care (Signed)
Immediate Anesthesia Transfer of Care Note  Patient: Judy Patton  Procedure(s) Performed: DILATATION & CURETTAGE/HYSTEROSCOPY WITH NOVASURE ENDOMETRIAL ABLATION (N/A Vagina ) LAPAROSCOPIC BILATERAL SALPINGECTOMY (Bilateral Abdomen)  Patient Location: PACU  Anesthesia Type:General  Level of Consciousness: awake, oriented and patient cooperative  Airway & Oxygen Therapy: Patient connected to face mask oxygen  Post-op Assessment: Report given to RN and Post -op Vital signs reviewed and stable  Post vital signs: Reviewed and stable  Last Vitals:  Vitals:   11/08/17 1117 11/08/17 1440  BP: 137/67 (!) (P) 133/53  Pulse: (!) 108 95  Resp:  (!) 9  Temp: 36.9 C (P) 36.7 C  SpO2: 97% 100%    Last Pain:  Vitals:   11/08/17 1117  TempSrc: Oral         Complications: No apparent anesthesia complications

## 2017-11-08 NOTE — Brief Op Note (Signed)
11/08/2017  2:43 PM  PATIENT:  Judy Patton  47 y.o. female  PRE-OPERATIVE DIAGNOSIS:  Desire for Elective Permanent Sterilization and cancer risk reduction; menorrhagia on xarelto with normal ultrasound  POST-OPERATIVE DIAGNOSIS:  Desire for Elective Permanent Sterilization and cancer risk reduction; menorrhagia on xarelto with normal ultrasound,   PROCEDURE:  Procedure(s): DILATATION & CURETTAGE/HYSTEROSCOPY WITH NOVASURE ENDOMETRIAL ABLATION (N/A) LAPAROSCOPIC BILATERAL SALPINGECTOMY (Bilateral)  SURGEON:  Surgeon(s) and Role:    Jonnie Kind, MD - Primary  PHYSICIAN ASSISTANT:   ASSISTANTS: none   ANESTHESIA:   local and general  EBL: minimal  BLOOD ADMINISTERED:none  DRAINS: Urinary Catheter (Foley)   LOCAL MEDICATIONS USED:  MARCAINE     SPECimen bilateral fallopian tubes  DISPOSITION OF SPECIMEN:  PATHOLOGY  COUNTS:  YES  TOURNIQUET:  * No tourniquets in log *  DICTATION: .Dragon Dictation  PLAN OF CARE: Admit to inpatient   PATIENT DISPOSITION:  PACU - hemodynamically stable.   Delay start of Pharmacological VTE agent (>24hrs) due to surgical blood loss or risk of bleeding: not applicable Details of procedure: Patient was taken the operating room prepped and draped for vaginal procedure combined with abdominal procedure.  Timeout was conducted Ancef administered surgical consent confirmed by operative team. An infraumbilical vertical 1 cm skin incision was made as well as a transverse suprapubic and right lower quadrant incision of similar length.  Veress needle was used to introduce pneumoperitoneum under 8 mmHg pressure with laparoscopic trocar introduced revealing a normal abdominal contents.  With patient in Trendelenburg position the suprapubic and right lower quadrant trochars were inserted under direct visualization and attention directed to the fallopian tubes which were identified photo documented and then removed using harmonic a 7 to amputate the  tube off of the mesosalpinx on each side.  Bessman's were extracted through the suprapubic trocar which was a 5 mm trocar.  After removal of both pedicles were inspected and hemostatic.  The abdomen was deflated, 100 cc of saline was instilled in the abdomen to assist with evacuation of the carbon dioxide, and the abdomen decompressed.  Subcuticular 4-0 Vicryl was used to close all skin incisions.  Steri-Strips and benzoin were used along back and then Band-Aids were placed over the incisions.  Hemostasis was good.   Endometrial ablation:.  At this point patient was examined vaginally with speculum inserted, the single-tooth tenaculum used to grasp the cervix which was then held in place while the anteverted uterus was sounded to 8 cm, dilated to the uterus was noted to be somewhat heart shaped.  The uterus sounded to 7 cm.  The endometrial cavity was completely denuded as the patient was just at the end of her menses.  There was no sample to obtain.  The NovaSure endometrial ablation tested confirmed as appropriate seal obtained and then the 92nd endometrial ablation sequence completed without difficulty.  Paracervical block was applied using 20 cc of Marcaine 0.5%.  Patient then was allowed to go to recovery room in stable condition tolerated procedure well with sponge and needle counts correct

## 2017-11-08 NOTE — Anesthesia Preprocedure Evaluation (Signed)
Anesthesia Evaluation  Patient identified by MRN, date of birth, ID band Patient awake    Reviewed: Allergy & Precautions, NPO status , Patient's Chart, lab work & pertinent test results  History of Anesthesia Complications (+) PONV and history of anesthetic complications  Airway Mallampati: I  TM Distance: >3 FB     Dental  (+) Partial Upper, Teeth Intact   Pulmonary PE (xaralto Rx)   breath sounds clear to auscultation       Cardiovascular negative cardio ROS   Rhythm:Regular Rate:Normal     Neuro/Psych  Headaches, PSYCHIATRIC DISORDERS Anxiety Depression    GI/Hepatic negative GI ROS, Neg liver ROS,   Endo/Other  Hypothyroidism   Renal/GU negative Renal ROS     Musculoskeletal negative musculoskeletal ROS (+)   Abdominal   Peds  Hematology  (+) Blood dyscrasia (Factor V mutation), ,   Anesthesia Other Findings   Reproductive/Obstetrics                             Anesthesia Physical Anesthesia Plan  ASA: III  Anesthesia Plan: General   Post-op Pain Management:    Induction: Intravenous  PONV Risk Score and Plan:   Airway Management Planned: Oral ETT  Additional Equipment:   Intra-op Plan:   Post-operative Plan: Extubation in OR  Informed Consent: I have reviewed the patients History and Physical, chart, labs and discussed the procedure including the risks, benefits and alternatives for the proposed anesthesia with the patient or authorized representative who has indicated his/her understanding and acceptance.     Plan Discussed with:   Anesthesia Plan Comments:         Anesthesia Quick Evaluation

## 2017-11-10 ENCOUNTER — Encounter (HOSPITAL_COMMUNITY): Payer: Self-pay | Admitting: Obstetrics and Gynecology

## 2017-11-16 ENCOUNTER — Other Ambulatory Visit: Payer: Self-pay

## 2017-11-16 ENCOUNTER — Ambulatory Visit (INDEPENDENT_AMBULATORY_CARE_PROVIDER_SITE_OTHER): Payer: BC Managed Care – PPO | Admitting: Obstetrics and Gynecology

## 2017-11-16 ENCOUNTER — Encounter: Payer: Self-pay | Admitting: Obstetrics and Gynecology

## 2017-11-16 VITALS — BP 138/82 | HR 108 | Ht 65.0 in | Wt 195.8 lb

## 2017-11-16 DIAGNOSIS — Z09 Encounter for follow-up examination after completed treatment for conditions other than malignant neoplasm: Secondary | ICD-10-CM

## 2017-11-16 DIAGNOSIS — Z01818 Encounter for other preprocedural examination: Secondary | ICD-10-CM

## 2017-11-16 NOTE — Progress Notes (Signed)
Patient ID: Judy Patton, female   DOB: [birth year], 47 y.o.   MRN: [id number]    Subjective:  Judy Patton is a 47 y.o. female now 1 weeks status post [place] and LAPAROSCOPIC BILATERAL SALPINGECTOMY.   The patient is doing well and reports that she no longer experiences abdominal cramps.   Review of Systems Negative   Diet:   normal   Bowel movements : normal.  The patient is not having any pain.  Objective:  BP 138/82 (BP Location: Right Arm, Patient Position: Sitting, Cuff Size: Normal)   Pulse (!) 108   Ht 5\' 5"  (1.651 m)   Wt 195 lb 12.8 oz (88.8 kg)   BMI 32.58 kg/m  General:Well developed, well nourished.  No acute distress. Abdomen: Bowel sounds normal, soft, non-tender. Pelvic Exam:    External Genitalia:  Normal.    Vagina: Normal clear secretions     Cervix: Normal    Uterus: Normal    Adnexa/Bimanual: Normal  Incision(s):   Healing well, no drainage, no erythema, no hernia, no swelling, no dehiscence,  Assessment:  Post-Op 1 weeks s/p DILATATION & CURETTAGE/HYSTEROSCOPY WITH NOVASURE ENDOMETRIAL ABLATION and LAPAROSCOPIC BILATERAL SALPINGECTOMY  Resolved  chronic pelvic cramping Doing well postoperatively.   Plan:  1.Wound care discussed  2. . current medications. 3. Activity restrictions: none 4. return to work: not applicable. 5. Follow up in 1 year.  By signing my name below, I, Margit Banda, attest that this documentation has been prepared under the direction and in the presence of Jonnie Kind, MD. Electronically Signed: Margit Banda, Medical Scribe. 11/16/17. 10:47 AM.  I personally performed the services described in this documentation, which was SCRIBED in my presence. The recorded information has been reviewed and considered accurate. It has been edited as necessary during review. Jonnie Kind, MD

## 2018-01-30 ENCOUNTER — Other Ambulatory Visit: Payer: Self-pay | Admitting: Adult Health

## 2018-11-26 ENCOUNTER — Other Ambulatory Visit: Payer: Self-pay | Admitting: Adult Health

## 2019-11-18 ENCOUNTER — Other Ambulatory Visit: Payer: Self-pay | Admitting: Adult Health

## 2022-11-23 ENCOUNTER — Other Ambulatory Visit (HOSPITAL_COMMUNITY): Payer: Self-pay | Admitting: Physician Assistant

## 2022-11-23 DIAGNOSIS — Z1231 Encounter for screening mammogram for malignant neoplasm of breast: Secondary | ICD-10-CM

## 2022-12-01 ENCOUNTER — Ambulatory Visit (HOSPITAL_COMMUNITY)
Admission: RE | Admit: 2022-12-01 | Discharge: 2022-12-01 | Disposition: A | Discharge status: Home / Self Care | Payer: BC Managed Care – PPO | Source: Ambulatory Visit | Attending: Physician Assistant | Admitting: Physician Assistant

## 2022-12-01 DIAGNOSIS — Z1231 Encounter for screening mammogram for malignant neoplasm of breast: Secondary | ICD-10-CM

## 2022-12-02 DIAGNOSIS — C50919 Malignant neoplasm of unspecified site of unspecified female breast: Secondary | ICD-10-CM

## 2022-12-02 HISTORY — DX: Malignant neoplasm of unspecified site of unspecified female breast: C50.919

## 2022-12-03 ENCOUNTER — Other Ambulatory Visit (HOSPITAL_COMMUNITY): Payer: Self-pay | Admitting: Physician Assistant

## 2022-12-06 ENCOUNTER — Other Ambulatory Visit (HOSPITAL_COMMUNITY): Payer: Self-pay | Admitting: Physician Assistant

## 2022-12-06 DIAGNOSIS — R928 Other abnormal and inconclusive findings on diagnostic imaging of breast: Secondary | ICD-10-CM

## 2022-12-14 ENCOUNTER — Ambulatory Visit (HOSPITAL_COMMUNITY)
Admission: RE | Admit: 2022-12-14 | Discharge: 2022-12-14 | Disposition: A | Discharge status: Home / Self Care | Payer: BC Managed Care – PPO | Source: Ambulatory Visit | Attending: Physician Assistant | Admitting: Physician Assistant

## 2022-12-14 ENCOUNTER — Other Ambulatory Visit (HOSPITAL_COMMUNITY): Payer: Self-pay | Admitting: Physician Assistant

## 2022-12-14 ENCOUNTER — Encounter (HOSPITAL_COMMUNITY): Payer: Self-pay

## 2022-12-14 DIAGNOSIS — R928 Other abnormal and inconclusive findings on diagnostic imaging of breast: Secondary | ICD-10-CM | POA: Insufficient documentation

## 2022-12-16 ENCOUNTER — Ambulatory Visit (HOSPITAL_COMMUNITY)
Admission: RE | Admit: 2022-12-16 | Discharge: 2022-12-16 | Disposition: A | Discharge status: Home / Self Care | Payer: BC Managed Care – PPO | Source: Ambulatory Visit | Attending: Physician Assistant | Admitting: Physician Assistant

## 2022-12-16 ENCOUNTER — Encounter (HOSPITAL_COMMUNITY): Payer: Self-pay

## 2022-12-16 ENCOUNTER — Other Ambulatory Visit (HOSPITAL_COMMUNITY): Payer: Self-pay | Admitting: Physician Assistant

## 2022-12-16 DIAGNOSIS — R928 Other abnormal and inconclusive findings on diagnostic imaging of breast: Secondary | ICD-10-CM

## 2022-12-16 HISTORY — PX: BREAST BIOPSY: SHX20

## 2022-12-16 MED ORDER — LIDOCAINE HCL (PF) 2 % IJ SOLN
INTRAMUSCULAR | Status: AC
  Administered 2022-12-16: 10 mL
  Filled 2022-12-16: qty 10

## 2022-12-16 MED ORDER — LIDOCAINE-EPINEPHRINE (PF) 1 %-1:200000 IJ SOLN
INTRAMUSCULAR | Status: AC
  Administered 2022-12-16: 10 mL
  Filled 2022-12-16: qty 30

## 2022-12-16 NOTE — Progress Notes (Signed)
PT tolerated left breast biopsy well today with NAD noted. PT verbalized understanding of discharge instructions. PT ambulated back to the mammogram area this time and given ice packs.

## 2022-12-20 LAB — SURGICAL PATHOLOGY

## 2022-12-22 NOTE — Progress Notes (Incomplete)
Judy Patton, [place] [address]    Clinic Day:  12/22/2022  Referring physician: Candi Leash, PA-C  Patient Care Team: Judy Leash, PA-C as PCP - General (Physician Assistant)   ASSESSMENT & PLAN:   Assessment: ***  Plan: ***  No orders of the defined types were placed in this encounter.     Judy Patton,acting as a scribe for Derek Jack, MD.,have documented all relevant documentation on the behalf of Derek Jack, MD,as directed by  Derek Jack, MD while in the presence of Derek Jack, MD.   ***  Judy Patton   2/21/20241:47 PM  CHIEF COMPLAINT:   Diagnosis: ***  Cancer Staging  No matching staging information was found for the patient.   Prior Therapy: ***  Current Therapy:  ***   HISTORY OF PRESENT ILLNESS:   Oncology History   No history exists.     INTERVAL HISTORY:   Judy Patton is a 52 y.o. female presenting to clinic today for follow up of ***. She was last seen by me on ***.  Today, she states that she is doing well overall. Her appetite level is at ***%. Her energy level is at ***%. She denies any {oncneg:[id number]}.    PAST MEDICAL HISTORY:   Past Medical History: Past Medical History:  Diagnosis Date   Anxiety    Chronic headaches    Depression    Factor V Leiden mutation ([place])    Fatigue 02/18/2015   History of kidney stones    Hypothyroidism    MRSA (methicillin resistant Staphylococcus aureus)    Pain of tooth socket 03/14/2015   PONV (postoperative nausea and vomiting)    Pulmonary embolus ([facility])    Restless leg syndrome    Shingles rash 03/14/2015   Shoulder injury    Stress    family   Weight gain 02/18/2015    Surgical History: Past Surgical History:  Procedure Laterality Date   BREAST BIOPSY Left 12/16/2022   [place] LT BREAST BX W LOC DEV 1ST LESION IMG BX SPEC US GUIDE 12/16/2022 [facility]   COLONOSCOPY  03/25/2004   [id number] canal  hemorrhoids, otherwise normal rectum,   DILITATION & CURRETTAGE/HYSTROSCOPY WITH NOVASURE ABLATION N/A 11/08/2017   Procedure: DILATATION & CURETTAGE/HYSTEROSCOPY WITH NOVASURE ENDOMETRIAL ABLATION;  Surgeon: Jonnie Kind, MD;  Location: [facility];  Service: Gynecology;  Laterality: N/A;   ESOPHAGOGASTRODUODENOSCOPY  03/25/2004   [id number] esophagus, small hiatal hernia, otherwise normal stomach   kidney stones  05/2014,11/2014   LAPAROSCOPIC BILATERAL SALPINGECTOMY Bilateral 11/08/2017   Procedure: LAPAROSCOPIC BILATERAL SALPINGECTOMY;  Surgeon: Jonnie Kind, MD;  Location: [facility];  Service: Gynecology;  Laterality: Bilateral;   LITHOTRIPSY  2008    Social History: Social History   Socioeconomic History   Marital status: Married    Spouse name: Not on file   Number of children: Not on file   Years of education: Not on file   Highest education level: Not on file  Occupational History   Not on file  Tobacco Use   Smoking status: Never   Smokeless tobacco: Never  Vaping Use   Vaping Use: Never used  Substance and Sexual Activity   Alcohol use: Yes    Comment: occ   Drug use: No   Sexual activity: Not Currently    Birth control/protection: Surgical  Other Topics Concern   Not on file  Social History Narrative   Not on file   Social Determinants of Health  Financial Resource Strain: Not on file  Food Insecurity: Not on file  Transportation Needs: Not on file  Physical Activity: Not on file  Stress: Not on file  Social Connections: Not on file  Intimate Partner Violence: Not on file    Family History: Family History  Problem Relation Age of Onset   Cancer Brother        kidney   Factor V Leiden deficiency Brother    Fibromyalgia Brother    Cancer Paternal Grandmother        breast cancer   Cancer Paternal Grandfather        lung cancer   Stroke Mother    CAD Mother    Cancer Father        prostate    Current Medications:  Current Outpatient Medications:     acetaminophen (TYLENOL) 500 MG tablet, Take 1,000 mg by mouth every 6 (six) hours as needed for moderate pain or headache., Disp: , Rfl:    ibuprofen (ADVIL,MOTRIN) 200 MG tablet, Take 400 mg by mouth every 6 (six) hours as needed for headache or moderate pain., Disp: , Rfl:    levothyroxine (SYNTHROID, LEVOTHROID) 88 MCG tablet, TAKE 88 MCG BY MOUTH IN THE MORNING, Disp: 90 tablet, Rfl: 3   oxyCODONE-acetaminophen (PERCOCET/ROXICET) 5-325 MG tablet, Take 1 tablet by mouth every 4 (four) hours as needed., Disp: 20 tablet, Rfl: 0   rOPINIRole (REQUIP) 2 MG tablet, TAKE ONE TABLET BY MOUTH AT BEDTIME (Patient taking differently: TAKE 2 MG BY MOUTH AT BEDTIME), Disp: 30 tablet, Rfl: 9   sertraline (ZOLOFT) 50 MG tablet, Take one for PMS as needed (Patient not taking: Reported on 10/17/2017), Disp: 30 tablet, Rfl: 3   trolamine salicylate (ASPERCREME) 10 % cream, Apply 1 application topically as needed for muscle pain., Disp: , Rfl:    XARELTO 20 MG TABS tablet, Take 20 mg by mouth in the morning, Disp: , Rfl: 2   Allergies: Allergies  Allergen Reactions   Vicodin [Hydrocodone-Acetaminophen] Swelling    REVIEW OF SYSTEMS:   Review of Systems - Oncology   VITALS:   There were no vitals taken for this visit.  Wt Readings from Last 3 Encounters:  11/16/17 195 lb 12.8 oz (88.8 kg)  11/08/17 200 lb (90.7 kg)  11/02/17 200 lb (90.7 kg)    There is no height or weight on file to calculate BMI.  Performance status (ECOG): {CHL ONC [id number]  PHYSICAL EXAM:   Physical Exam  LABS:      Latest Ref Rng & Units 11/02/2017    2:53 PM 02/18/2015    3:27 PM 08/07/2013    3:46 PM  CBC  WBC 4.0 - 10.5 K/uL 12.0  15.7  11.0   Hemoglobin 12.0 - 15.0 g/dL 13.2  13.0  12.6   Hematocrit 36.0 - 46.0 % 41.6  38.8  37.9   Platelets 150 - 400 K/uL 291  343  358       Latest Ref Rng & Units 11/02/2017    2:53 PM 02/18/2015    3:27 PM 12/26/2013    2:34 PM  CMP  Glucose 65 - 99 mg/dL 103  87   76   BUN 6 - 20 mg/dL 13  9  10   $ Creatinine 0.44 - 1.00 mg/dL 0.71  0.56  0.53   Sodium 135 - 145 mmol/L 139  141  137   Potassium 3.5 - 5.1 mmol/L 3.8  3.8  3.8   Chloride 101 -  111 mmol/L 105  102  102   CO2 22 - 32 mmol/L 24  25  24   $ Calcium 8.9 - 10.3 mg/dL 8.8  9.1  8.9   Total Protein 6.0 - 8.5 g/dL  6.8    Total Bilirubin 0.0 - 1.2 mg/dL  0.2    Alkaline Phos 39 - 117 IU/L  134    AST 0 - 40 IU/L  14    ALT 0 - 32 IU/L  11       No results found for: "CEA1", "CEA" / No results found for: "CEA1", "CEA" No results found for: "PSA1" No results found for: "[id number]" No results found for: "CAN125"  No results found for: "TOTALPROTELP", "ALBUMINELP", "A1GS", "A2GS", "BETS", "BETA2SER", "GAMS", "MSPIKE", "SPEI" Lab Results  Component Value Date   FERRITIN 110 11/13/2013   FERRITIN 81 08/07/2013   No results found for: "LDH"   STUDIES:   [place] LT BREAST BX W LOC DEV 1ST LESION IMG BX SPEC US GUIDE  Addendum Date: 12/17/2022   ADDENDUM REPORT: 12/17/2022 12:27 ADDENDUM: PATHOLOGY revealed: A. BREAST, LEFT, 1 OC MASS , NEEDLE CORE BIOPSY: - Invasive mammary carcinoma (e-cadherin pending). - Overall Grade: II/III. - Lymphovascular invasion: Not identified. - Cancer Length: 8 mm in greatest linear dimension on fragmented cores. - Calcifications: Present. Pathology results are CONCORDANT with imaging findings, per Dr. Everlean Alstrom. Pathology results and recommendations were discussed with patient via telephone on 12/17/2022. Patient reported biopsy site doing well with no adverse symptoms, and only slight tenderness at the site. Post biopsy care instructions were reviewed, questions were answered and my direct phone number was provided. Patient was instructed to call [facility] Mammography Department for any additional questions or concerns related to biopsy site. RECOMMENDATION: Surgical and oncological consultation. Request for surgical and oncological consultation  was relayed to Kathi Der RT at [facility] Mammography Department by Electa Sniff RN on 12/17/2022. Pathology results reported by Electa Sniff RN on 12/17/2022. Electronically Signed   By: Everlean Alstrom M.D.   On: 12/17/2022 12:27   Result Date: 12/17/2022 CLINICAL DATA:  52 year old female presents for ultrasound-guided core biopsy of a suspicious 1 cm mass in the left breast at 1 o'clock 4 cm from nipple. EXAM: ULTRASOUND GUIDED LEFT BREAST CORE NEEDLE BIOPSY COMPARISON:  Previous exam(s). PROCEDURE: I met with the patient and we discussed the procedure of ultrasound-guided biopsy, including benefits and alternatives. We discussed the high likelihood of a successful procedure. We discussed the risks of the procedure, including infection, bleeding, tissue injury, clip migration, and inadequate sampling. Informed written consent was given. The usual time-out protocol was performed immediately prior to the procedure. Lesion quadrant: Upper-outer Using sterile technique and 1% Lidocaine as local anesthetic, under direct ultrasound visualization, a 14 gauge spring-loaded device was used to perform biopsy of the mass in the left breast at the 1 o'clock position using a medial to lateral approach. At the conclusion of the procedure a ribbon shaped tissue marker clip was deployed into the biopsy cavity. Follow up 2 view mammogram was performed and dictated separately. IMPRESSION: Ultrasound guided biopsy of the mass in the left breast at the 1 o'clock position. No apparent complications. Electronically Signed: By: Everlean Alstrom M.D. On: 12/16/2022 13:31   MM CLIP PLACEMENT LEFT  Result Date: 12/16/2022 CLINICAL DATA:  Post ultrasound-guided core biopsy of a mass in the left breast at the 1 o'clock position. EXAM: 3D DIAGNOSTIC LEFT MAMMOGRAM POST ULTRASOUND BIOPSY  COMPARISON:  Previous exam(s). FINDINGS: 3D Mammographic images were obtained following ultrasound-guided core biopsy of a mass in the  left breast at the 1 o'clock position. Shaped biopsy marking is present site of the in the left breast at the 1 o'clock position. IMPRESSION: Ribbon shaped biopsy marking clip at site of biopsied mass in the left breast at the 1 o'clock position. Final Assessment: Post Procedure Mammograms for Marker Placement Electronically Signed   By: Everlean Alstrom M.D.   On: 12/16/2022 13:43  MM DIAG BREAST TOMO UNI LEFT  Result Date: 12/14/2022 CLINICAL DATA:  Recall from screening mammography, possible architectural distortion involving the UPPER OUTER QUADRANT of the LEFT breast at middle to posterior depth. EXAM: DIGITAL DIAGNOSTIC UNILATERAL LEFT MAMMOGRAM WITH TOMOSYNTHESIS; ULTRASOUND LEFT BREAST LIMITED TECHNIQUE: Left digital diagnostic mammography and breast tomosynthesis was performed.; Targeted ultrasound examination of the left breast was performed. COMPARISON:  Previous exam(s). ACR Breast Density Category c: The breasts are heterogeneously dense, which may obscure small masses. FINDINGS: Spot compression CC and MLO views of the area of concern were obtained. Persistent architectural distortion likely associated with a mass measuring on the order of 1 cm in size in the UPPER OUTER QUADRANT at middle to posterior depth. No associated suspicious calcifications. Targeted ultrasound is performed, demonstrating an irregular hypoechoic mass with angular margins at the 1 o'clock position 4 cm from the nipple at middle depth, measuring approximately 1.0 x 0.7 x 0.9 cm, demonstrating posterior acoustic shadowing and demonstrating internal power Doppler flow. Sonographic evaluation of the LEFT axilla demonstrates no pathologic lymphadenopathy. IMPRESSION: 1. Highly suspicious 1.0 cm mass involving the UPPER OUTER QUADRANT of the LEFT breast at 1 o'clock 4 cm from nipple. 2. No pathologic LEFT axillary lymphadenopathy. RECOMMENDATION: Ultrasound-guided core needle biopsy of the LEFT breast mass. I have discussed the  findings and recommendations with the patient. The ultrasound core needle biopsy procedure was discussed with the patient and her questions were answered. She wishes to proceed with the biopsy which will be scheduled by the Breast Imaging staff. BI-RADS CATEGORY  5: Highly suggestive of malignancy. Electronically Signed   By: Evangeline Dakin M.D.   On: 12/14/2022 09:44  US BREAST LTD UNI LEFT INC AXILLA  Result Date: 12/14/2022 CLINICAL DATA:  Recall from screening mammography, possible architectural distortion involving the UPPER OUTER QUADRANT of the LEFT breast at middle to posterior depth. EXAM: DIGITAL DIAGNOSTIC UNILATERAL LEFT MAMMOGRAM WITH TOMOSYNTHESIS; ULTRASOUND LEFT BREAST LIMITED TECHNIQUE: Left digital diagnostic mammography and breast tomosynthesis was performed.; Targeted ultrasound examination of the left breast was performed. COMPARISON:  Previous exam(s). ACR Breast Density Category c: The breasts are heterogeneously dense, which may obscure small masses. FINDINGS: Spot compression CC and MLO views of the area of concern were obtained. Persistent architectural distortion likely associated with a mass measuring on the order of 1 cm in size in the UPPER OUTER QUADRANT at middle to posterior depth. No associated suspicious calcifications. Targeted ultrasound is performed, demonstrating an irregular hypoechoic mass with angular margins at the 1 o'clock position 4 cm from the nipple at middle depth, measuring approximately 1.0 x 0.7 x 0.9 cm, demonstrating posterior acoustic shadowing and demonstrating internal power Doppler flow. Sonographic evaluation of the LEFT axilla demonstrates no pathologic lymphadenopathy. IMPRESSION: 1. Highly suspicious 1.0 cm mass involving the UPPER OUTER QUADRANT of the LEFT breast at 1 o'clock 4 cm from nipple. 2. No pathologic LEFT axillary lymphadenopathy. RECOMMENDATION: Ultrasound-guided core needle biopsy of the LEFT breast mass. I have discussed the  findings  and recommendations with the patient. The ultrasound core needle biopsy procedure was discussed with the patient and her questions were answered. She wishes to proceed with the biopsy which will be scheduled by the Breast Imaging staff. BI-RADS CATEGORY  5: Highly suggestive of malignancy. Electronically Signed   By: Evangeline Dakin M.D.   On: 12/14/2022 09:44  MM 3D SCREEN BREAST BILATERAL  Result Date: 12/02/2022 CLINICAL DATA:  Screening. EXAM: DIGITAL SCREENING BILATERAL MAMMOGRAM WITH TOMOSYNTHESIS AND CAD TECHNIQUE: Bilateral screening digital craniocaudal and mediolateral oblique mammograms were obtained. Bilateral screening digital breast tomosynthesis was performed. The images were evaluated with computer-aided detection. COMPARISON:  Previous exam(s). ACR Breast Density Category c: The breast tissue is heterogeneously dense, which may obscure small masses. FINDINGS: In the left breast, possible distortion warrants further evaluation. In the right breast, no findings suspicious for malignancy. IMPRESSION: Further evaluation is suggested for possible distortion in the left breast. RECOMMENDATION: Diagnostic mammogram and possibly ultrasound of the left breast. (Code:[id number]) The patient will be contacted regarding the findings, and additional imaging will be scheduled. BI-RADS CATEGORY  0: Incomplete. Need additional imaging evaluation and/or prior mammograms for comparison. Electronically Signed   By: Margarette Canada M.D.   On: 12/02/2022 13:15

## 2022-12-23 ENCOUNTER — Inpatient Hospital Stay: Payer: BC Managed Care – PPO | Attending: Hematology | Admitting: Hematology

## 2022-12-23 VITALS — BP 149/86 | HR 83 | Temp 98.1°F | Resp 17 | Ht 65.0 in | Wt 189.0 lb

## 2022-12-23 DIAGNOSIS — Z7901 Long term (current) use of anticoagulants: Secondary | ICD-10-CM | POA: Diagnosis not present

## 2022-12-23 DIAGNOSIS — F32A Depression, unspecified: Secondary | ICD-10-CM | POA: Insufficient documentation

## 2022-12-23 DIAGNOSIS — R232 Flushing: Secondary | ICD-10-CM | POA: Insufficient documentation

## 2022-12-23 DIAGNOSIS — Z8051 Family history of malignant neoplasm of kidney: Secondary | ICD-10-CM | POA: Insufficient documentation

## 2022-12-23 DIAGNOSIS — Z803 Family history of malignant neoplasm of breast: Secondary | ICD-10-CM | POA: Insufficient documentation

## 2022-12-23 DIAGNOSIS — Z170 Estrogen receptor positive status [ER+]: Secondary | ICD-10-CM | POA: Insufficient documentation

## 2022-12-23 DIAGNOSIS — C50412 Malignant neoplasm of upper-outer quadrant of left female breast: Secondary | ICD-10-CM | POA: Diagnosis not present

## 2022-12-23 DIAGNOSIS — Z801 Family history of malignant neoplasm of trachea, bronchus and lung: Secondary | ICD-10-CM | POA: Insufficient documentation

## 2022-12-23 DIAGNOSIS — Z86711 Personal history of pulmonary embolism: Secondary | ICD-10-CM | POA: Diagnosis not present

## 2022-12-23 NOTE — Progress Notes (Signed)
[name] [address]  [id number]   Clinic Day:  12/23/2022  Referring physician: Candi Leash, PA-C  Patient Care Team: Candi Leash, PA-C as PCP - General (Physician Assistant) Derek Jack, MD as Medical Oncologist (Medical Oncology)   ASSESSMENT & PLAN:   Assessment:  1.  Stage I (T1b N0 M0, G2, ER/PR+, HER2-) left breast UOQ IDC: - Screening mammogram (12/01/2022): Possible distortion in the left breast.  Right breast has no findings suspicious for malignancy. - Left breast diagnostic mammogram/US (12/14/2022): Irregular hypoechoic mass at the 1 o'clock position of the left breast, 4 cm from nipple, measuring 1 x 0.7 x 0.9 cm.  No pathologic lymphadenopathy in the left axilla. - Left breast biopsy (12/16/2022): Invasive ductal carcinoma, grade 2, ER/PR 100%, Ki-67 1%, HER2 0 by IHC.  2.  Social/family history: - She lives at home with her husband.  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  No prior history of smoking. - Paternal grandmother had breast cancer.  Brother had kidney cancer.  Father had prostate cancer.  3.  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 - Heterozygosity for factor V Leiden mutation.  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  PT mutation negative.  Protein C, protein S, AT III normal. - She has been on Xarelto since then.  Plan:  1.  Stage I (T1b N0 M0, G2, ER/PR+, HER2-) left breast UOQ IDC: - We talked about mammograms and pathology report in detail. - We talked about normal treatment paradigm of early-stage breast cancer with surgery followed by chemotherapy if needed, antiestrogen therapy and radiation therapy. - She has an appointment to see Dr. Constance Haw next week. - Given her personal and family history, will recommend genetics evaluation. - We will send Oncotype DX on  postsurgical sample. - RTC 4 weeks after surgery to discuss adjuvant therapy. - May stop Xarelto 3 days prior to surgery.   Orders Placed This Encounter  Procedures   DG Bone Density    Standing Status:   Future    Standing Expiration Date:   12/23/2023    Order Specific Question:   Reason for Exam (SYMPTOM  OR DIAGNOSIS REQUIRED)    Answer:   AI therapy evaluation    Order Specific Question:   Is patient pregnant?    Answer:   No    Order Specific Question:   Preferred imaging location?    Answer:   [facility]    Order Specific Question:   Release to patient    Answer:   Immediate   CBC with Differential    Standing Status:   Future    Standing Expiration Date:   12/23/2023   Comprehensive metabolic panel    Standing Status:   Future    Standing Expiration Date:   12/23/2023   Vitamin D 25 hydroxy    Standing Status:   Future    Standing Expiration Date:   12/23/2023   Ambulatory referral to Genetics    Referral Priority:   Routine    Referral Type:   Consultation    Referral Reason:   Specialty Services Required    Number of Visits Requested:   1      Kaleen Odea as a scribe for Derek Jack, MD.,have documented all relevant documentation on the behalf of Derek Jack, MD,as directed by  Derek Jack, MD  while in the presence of Derek Jack, MD.   I, Derek Jack MD, have reviewed the above documentation for accuracy and completeness, and I agree with the above.   Derek Jack, MD   2/22/20245:32 PM  CHIEF COMPLAINT/PURPOSE OF CONSULT:   Diagnosis: breast caner   Cancer Staging  Breast cancer of upper-outer quadrant of left female breast [facility]) Staging form: Breast, AJCC 8th Edition - Clinical stage from 12/23/2022: Stage IA (cT1b, cN0, cM0, G2, ER+, PR+, HER2-) - Unsigned    Prior Therapy: Left breast biopsy  Current Therapy: Under workup   HISTORY OF PRESENT ILLNESS:   Oncology History   No  history exists.      Judy Patton is a 52 y.o. female presenting to clinic today for evaluation of newly diagnosed breast cancer at the request of Dr.Jennifer Shelly Bombard.  She had a screening mammogram which was positive in the left breast.  This was followed with diagnostic left breast mammogram and ultrasound which showed abnormality in the left breast upper outer quadrant at 1 o'clock position.  On 12/16/2022 she received a left breast biopsy.   Today, she states that she is doing well overall. Her appetite level is at 100%. Her energy level is at 85%.she denied any prior breast biopsies.  She menarche around the age 89.  She had uterine ablation in 2019 due to heavy menses.she has been having hot flashes for the past 1 year, night was then daytime.  She took birth control pills for 25 years.  Denies any HRT.  She is a non-smoker.  Her paternal grandma had breast cancer, her brother had kidney cancer, and her dad has prostate cancer.   She is an Advertising account executive.   PAST MEDICAL HISTORY:   Past Medical History: Past Medical History:  Diagnosis Date   Anxiety    Chronic headaches    Depression    Factor V Leiden mutation ([place])    Fatigue 02/18/2015   History of kidney stones    Hypothyroidism    MRSA (methicillin resistant Staphylococcus aureus)    Pain of tooth socket 03/14/2015   PONV (postoperative nausea and vomiting)    Pulmonary embolus (HCC)    Restless leg syndrome    Shingles rash 03/14/2015   Shoulder injury    Stress    family   Weight gain 02/18/2015    Surgical History: Past Surgical History:  Procedure Laterality Date   BREAST BIOPSY Left 12/16/2022   [place] LT BREAST BX W LOC DEV 1ST LESION IMG BX SPEC US GUIDE 12/16/2022 [facility]-ULTRASOUND   COLONOSCOPY  03/25/2004   [id number] canal hemorrhoids, otherwise normal rectum,   DILITATION & CURRETTAGE/HYSTROSCOPY WITH NOVASURE ABLATION N/A 11/08/2017   Procedure: DILATATION & CURETTAGE/HYSTEROSCOPY WITH NOVASURE ENDOMETRIAL  ABLATION;  Surgeon: Jonnie Kind, MD;  Location: [facility];  Service: Gynecology;  Laterality: N/A;   ESOPHAGOGASTRODUODENOSCOPY  03/25/2004   [id number] esophagus, small hiatal hernia, otherwise normal stomach   kidney stones  05/2014,11/2014   LAPAROSCOPIC BILATERAL SALPINGECTOMY Bilateral 11/08/2017   Procedure: LAPAROSCOPIC BILATERAL SALPINGECTOMY;  Surgeon: Jonnie Kind, MD;  Location: [facility];  Service: Gynecology;  Laterality: Bilateral;   LITHOTRIPSY  2008    Social History: Social History   Socioeconomic History   Marital status: Married    Spouse name: Not on file   Number of children: Not on file   Years of education: Not on file   Highest education level: Not on file  Occupational History   Not on file  Tobacco Use   Smoking status: Never   Smokeless tobacco: Never  Vaping Use   Vaping Use: Never used  Substance and Sexual Activity   Alcohol use: Yes    Comment: occ   Drug use: No   Sexual activity: Not Currently    Birth control/protection: Surgical  Other Topics Concern   Not on file  Social History Narrative   Not on file   Social Determinants of Health   Financial Resource Strain: Not on file  Food Insecurity: No Food Insecurity (12/23/2022)   Hunger Vital Sign    Worried About Running Out of Food in the Last Year: Never true    Ran Out of Food in the Last Year: Never true  Transportation Needs: No Transportation Needs (12/23/2022)   PRAPARE - Transportation    Lack of Transportation (Medical): No    Lack of Transportation (Non-Medical): No  Physical Activity: Not on file  Stress: Not on file  Social Connections: Not on file  Intimate Partner Violence: Not At Risk (12/23/2022)   Humiliation, Afraid, Rape, and Kick questionnaire    Fear of Current or Ex-Partner: No    Emotionally Abused: No    Physically Abused: No    Sexually Abused: No    Family History: Family History  Problem Relation Age of Onset   Cancer Brother        kidney   Factor V  Leiden deficiency Brother    Fibromyalgia Brother    Cancer Paternal Grandmother        breast cancer   Cancer Paternal Grandfather        lung cancer   Stroke Mother    CAD Mother    Cancer Father        prostate    Current Medications:  Current Outpatient Medications:    acetaminophen (TYLENOL) 500 MG tablet, Take 1,000 mg by mouth every 6 (six) hours as needed for moderate pain or headache., Disp: , Rfl:    ibuprofen (ADVIL,MOTRIN) 200 MG tablet, Take 400 mg by mouth every 6 (six) hours as needed for headache or moderate pain., Disp: , Rfl:    levothyroxine (SYNTHROID, LEVOTHROID) 88 MCG tablet, TAKE 88 MCG BY MOUTH IN THE MORNING, Disp: 90 tablet, Rfl: 3   rOPINIRole (REQUIP) 2 MG tablet, TAKE ONE TABLET BY MOUTH AT BEDTIME, Disp: 30 tablet, Rfl: 9   trolamine salicylate (ASPERCREME) 10 % cream, Apply 1 application topically as needed for muscle pain., Disp: , Rfl:    XARELTO 20 MG TABS tablet, Take 20 mg by mouth in the morning, Disp: , Rfl: 2   TRINTELLIX 20 MG TABS tablet, Take 20 mg by mouth daily., Disp: , Rfl:    Allergies: Allergies  Allergen Reactions   Vicodin [Hydrocodone-Acetaminophen] Swelling    REVIEW OF SYSTEMS:   Review of Systems  Constitutional:  Negative for chills, fatigue and fever.  HENT:   Negative for lump/mass, mouth sores, nosebleeds, sore throat and trouble swallowing.   Eyes:  Negative for eye problems.  Respiratory:  Negative for cough and shortness of breath.   Cardiovascular:  Negative for chest pain, leg swelling and palpitations.  Gastrointestinal:  Negative for abdominal pain, constipation, diarrhea, nausea and vomiting.  Genitourinary:  Negative for bladder incontinence, difficulty urinating, dysuria, frequency, hematuria and nocturia.   Musculoskeletal:  Negative for arthralgias, back pain, flank pain, myalgias and neck pain.  Skin:  Negative for itching and rash.  Neurological:  Negative for dizziness, headaches and numbness.  Hematological:  Does not bruise/bleed easily.  Psychiatric/Behavioral:  Positive for depression. Negative for sleep disturbance and suicidal ideas. The patient is nervous/anxious.   All other systems reviewed and are negative.    VITALS:   Blood pressure (!) 149/86, pulse 83, temperature 98.1 F (36.7 C), temperature source Oral, resp. rate 17, height 5' 5"$  (1.651 m), weight 189 lb (85.7 kg), SpO2 99 %.  Wt Readings from Last 3 Encounters:  12/23/22 189 lb (85.7 kg)  11/16/17 195 lb 12.8 oz (88.8 kg)  11/08/17 200 lb (90.7 kg)    Body mass index is 31.45 kg/m.  Performance status (ECOG): 0 - Asymptomatic  PHYSICAL EXAM:   Physical Exam Vitals and nursing note reviewed. Exam conducted with a chaperone present.  Constitutional:      Appearance: Normal appearance.  Cardiovascular:     Rate and Rhythm: Normal rate and regular rhythm.     Pulses: Normal pulses.     Heart sounds: Normal heart sounds.  Pulmonary:     Effort: Pulmonary effort is normal.     Breath sounds: Normal breath sounds.  Abdominal:     Palpations: Abdomen is soft. There is no hepatomegaly, splenomegaly or mass.     Tenderness: There is no abdominal tenderness.  Musculoskeletal:     Right lower leg: No edema.     Left lower leg: No edema.  Lymphadenopathy:     Cervical: No cervical adenopathy.     Right cervical: No superficial, deep or posterior cervical adenopathy.    Left cervical: No superficial, deep or posterior cervical adenopathy.     Upper Body:     Right upper body: No supraclavicular or axillary adenopathy.     Left upper body: No supraclavicular or axillary adenopathy.  Neurological:     General: No focal deficit present.     Mental Status: She is alert and oriented to person, place, and time.  Psychiatric:        Mood and Affect: Mood normal.        Behavior: Behavior normal.     LABS:      Latest Ref Rng & Units 11/02/2017    2:53 PM 02/18/2015    3:27 PM 08/07/2013    3:46 PM   CBC  WBC 4.0 - 10.5 K/uL 12.0  15.7  11.0   Hemoglobin 12.0 - 15.0 g/dL 13.2  13.0  12.6   Hematocrit 36.0 - 46.0 % 41.6  38.8  37.9   Platelets 150 - 400 K/uL 291  343  358       Latest Ref Rng & Units 11/02/2017    2:53 PM 02/18/2015    3:27 PM 12/26/2013    2:34 PM  CMP  Glucose 65 - 99 mg/dL 103  87  76   BUN 6 - 20 mg/dL 13  9  10   $ Creatinine 0.44 - 1.00 mg/dL 0.71  0.56  0.53   Sodium 135 - 145 mmol/L 139  141  137   Potassium 3.5 - 5.1 mmol/L 3.8  3.8  3.8   Chloride 101 - 111 mmol/L 105  102  102   CO2 22 - 32 mmol/L 24  25  24   $ Calcium 8.9 - 10.3 mg/dL 8.8  9.1  8.9   Total Protein 6.0 - 8.5 g/dL  6.8    Total Bilirubin 0.0 - 1.2 mg/dL  0.2    Alkaline Phos 39 - 117 IU/L  134    AST 0 - 40  IU/L  14    ALT 0 - 32 IU/L  11       No results found for: "CEA1", "CEA" / No results found for: "CEA1", "CEA" No results found for: "PSA1" No results found for: "[id number]" No results found for: "CAN125"  No results found for: "TOTALPROTELP", "ALBUMINELP", "A1GS", "A2GS", "BETS", "BETA2SER", "GAMS", "MSPIKE", "SPEI" Lab Results  Component Value Date   FERRITIN 110 11/13/2013   FERRITIN 81 08/07/2013   No results found for: "LDH"   STUDIES:   [place] LT BREAST BX W LOC DEV 1ST LESION IMG BX SPEC US GUIDE  Addendum Date: 12/17/2022   ADDENDUM REPORT: 12/17/2022 12:27 ADDENDUM: PATHOLOGY revealed: A. BREAST, LEFT, 1 OC MASS , NEEDLE CORE BIOPSY: - Invasive mammary carcinoma (e-cadherin pending). - Overall Grade: II/III. - Lymphovascular invasion: Not identified. - Cancer Length: 8 mm in greatest linear dimension on fragmented cores. - Calcifications: Present. Pathology results are CONCORDANT with imaging findings, per Dr. Everlean Alstrom. Pathology results and recommendations were discussed with patient via telephone on 12/17/2022. Patient reported biopsy site doing well with no adverse symptoms, and only slight tenderness at the site. Post biopsy care instructions were reviewed,  questions were answered and my direct phone number was provided. Patient was instructed to call [facility] Mammography Department for any additional questions or concerns related to biopsy site. RECOMMENDATION: Surgical and oncological consultation. Request for surgical and oncological consultation was relayed to Kathi Der RT at [facility] Mammography Department by Electa Sniff RN on 12/17/2022. Pathology results reported by Electa Sniff RN on 12/17/2022. Electronically Signed   By: Everlean Alstrom M.D.   On: 12/17/2022 12:27   Result Date: 12/17/2022 CLINICAL DATA:  52 year old female presents for ultrasound-guided core biopsy of a suspicious 1 cm mass in the left breast at 1 o'clock 4 cm from nipple. EXAM: ULTRASOUND GUIDED LEFT BREAST CORE NEEDLE BIOPSY COMPARISON:  Previous exam(s). PROCEDURE: I met with the patient and we discussed the procedure of ultrasound-guided biopsy, including benefits and alternatives. We discussed the high likelihood of a successful procedure. We discussed the risks of the procedure, including infection, bleeding, tissue injury, clip migration, and inadequate sampling. Informed written consent was given. The usual time-out protocol was performed immediately prior to the procedure. Lesion quadrant: Upper-outer Using sterile technique and 1% Lidocaine as local anesthetic, under direct ultrasound visualization, a 14 gauge spring-loaded device was used to perform biopsy of the mass in the left breast at the 1 o'clock position using a medial to lateral approach. At the conclusion of the procedure a ribbon shaped tissue marker clip was deployed into the biopsy cavity. Follow up 2 view mammogram was performed and dictated separately. IMPRESSION: Ultrasound guided biopsy of the mass in the left breast at the 1 o'clock position. No apparent complications. Electronically Signed: By: Everlean Alstrom M.D. On: 12/16/2022 13:31   MM CLIP PLACEMENT LEFT  Result  Date: 12/16/2022 CLINICAL DATA:  Post ultrasound-guided core biopsy of a mass in the left breast at the 1 o'clock position. EXAM: 3D DIAGNOSTIC LEFT MAMMOGRAM POST ULTRASOUND BIOPSY COMPARISON:  Previous exam(s). FINDINGS: 3D Mammographic images were obtained following ultrasound-guided core biopsy of a mass in the left breast at the 1 o'clock position. Shaped biopsy marking is present site of the in the left breast at the 1 o'clock position. IMPRESSION: Ribbon shaped biopsy marking clip at site of biopsied mass in the left breast at the 1 o'clock position. Final Assessment: Post Procedure Mammograms for  Marker Placement Electronically Signed   By: Everlean Alstrom M.D.   On: 12/16/2022 13:43  MM DIAG BREAST TOMO UNI LEFT  Result Date: 12/14/2022 CLINICAL DATA:  Recall from screening mammography, possible architectural distortion involving the UPPER OUTER QUADRANT of the LEFT breast at middle to posterior depth. EXAM: DIGITAL DIAGNOSTIC UNILATERAL LEFT MAMMOGRAM WITH TOMOSYNTHESIS; ULTRASOUND LEFT BREAST LIMITED TECHNIQUE: Left digital diagnostic mammography and breast tomosynthesis was performed.; Targeted ultrasound examination of the left breast was performed. COMPARISON:  Previous exam(s). ACR Breast Density Category c: The breasts are heterogeneously dense, which may obscure small masses. FINDINGS: Spot compression CC and MLO views of the area of concern were obtained. Persistent architectural distortion likely associated with a mass measuring on the order of 1 cm in size in the UPPER OUTER QUADRANT at middle to posterior depth. No associated suspicious calcifications. Targeted ultrasound is performed, demonstrating an irregular hypoechoic mass with angular margins at the 1 o'clock position 4 cm from the nipple at middle depth, measuring approximately 1.0 x 0.7 x 0.9 cm, demonstrating posterior acoustic shadowing and demonstrating internal power Doppler flow. Sonographic evaluation of the LEFT axilla  demonstrates no pathologic lymphadenopathy. IMPRESSION: 1. Highly suspicious 1.0 cm mass involving the UPPER OUTER QUADRANT of the LEFT breast at 1 o'clock 4 cm from nipple. 2. No pathologic LEFT axillary lymphadenopathy. RECOMMENDATION: Ultrasound-guided core needle biopsy of the LEFT breast mass. I have discussed the findings and recommendations with the patient. The ultrasound core needle biopsy procedure was discussed with the patient and her questions were answered. She wishes to proceed with the biopsy which will be scheduled by the Breast Imaging staff. BI-RADS CATEGORY  5: Highly suggestive of malignancy. Electronically Signed   By: Evangeline Dakin M.D.   On: 12/14/2022 09:44  US BREAST LTD UNI LEFT INC AXILLA  Result Date: 12/14/2022 CLINICAL DATA:  Recall from screening mammography, possible architectural distortion involving the UPPER OUTER QUADRANT of the LEFT breast at middle to posterior depth. EXAM: DIGITAL DIAGNOSTIC UNILATERAL LEFT MAMMOGRAM WITH TOMOSYNTHESIS; ULTRASOUND LEFT BREAST LIMITED TECHNIQUE: Left digital diagnostic mammography and breast tomosynthesis was performed.; Targeted ultrasound examination of the left breast was performed. COMPARISON:  Previous exam(s). ACR Breast Density Category c: The breasts are heterogeneously dense, which may obscure small masses. FINDINGS: Spot compression CC and MLO views of the area of concern were obtained. Persistent architectural distortion likely associated with a mass measuring on the order of 1 cm in size in the UPPER OUTER QUADRANT at middle to posterior depth. No associated suspicious calcifications. Targeted ultrasound is performed, demonstrating an irregular hypoechoic mass with angular margins at the 1 o'clock position 4 cm from the nipple at middle depth, measuring approximately 1.0 x 0.7 x 0.9 cm, demonstrating posterior acoustic shadowing and demonstrating internal power Doppler flow. Sonographic evaluation of the LEFT axilla  demonstrates no pathologic lymphadenopathy. IMPRESSION: 1. Highly suspicious 1.0 cm mass involving the UPPER OUTER QUADRANT of the LEFT breast at 1 o'clock 4 cm from nipple. 2. No pathologic LEFT axillary lymphadenopathy. RECOMMENDATION: Ultrasound-guided core needle biopsy of the LEFT breast mass. I have discussed the findings and recommendations with the patient. The ultrasound core needle biopsy procedure was discussed with the patient and her questions were answered. She wishes to proceed with the biopsy which will be scheduled by the Breast Imaging staff. BI-RADS CATEGORY  5: Highly suggestive of malignancy. Electronically Signed   By: Evangeline Dakin M.D.   On: 12/14/2022 09:44  MM 3D SCREEN BREAST BILATERAL  Result Date:  12/02/2022 CLINICAL DATA:  Screening. EXAM: DIGITAL SCREENING BILATERAL MAMMOGRAM WITH TOMOSYNTHESIS AND CAD TECHNIQUE: Bilateral screening digital craniocaudal and mediolateral oblique mammograms were obtained. Bilateral screening digital breast tomosynthesis was performed. The images were evaluated with computer-aided detection. COMPARISON:  Previous exam(s). ACR Breast Density Category c: The breast tissue is heterogeneously dense, which may obscure small masses. FINDINGS: In the left breast, possible distortion warrants further evaluation. In the right breast, no findings suspicious for malignancy. IMPRESSION: Further evaluation is suggested for possible distortion in the left breast. RECOMMENDATION: Diagnostic mammogram and possibly ultrasound of the left breast. (Code:[id number]-[id number]) The patient will be contacted regarding the findings, and additional imaging will be scheduled. BI-RADS CATEGORY  0: Incomplete. Need additional imaging evaluation and/or prior mammograms for comparison. Electronically Signed   By: Margarette Canada M.D.   On: 12/02/2022 13:15

## 2022-12-23 NOTE — Patient Instructions (Addendum)
[place]  Discharge Instructions  You were seen and examined today by Dr. Delton Coombes. Dr. Delton Coombes is a medical oncologist, meaning that he specializes in the treatment of cancer diagnoses. Dr. Delton Coombes discussed your past medical history, family history of cancers, and the events that led to you being here today.  You were referred to Dr. Delton Coombes due to a new diagnosis of breast cancer. It does appear to be at Stage I Breast Cancer, as there were no notice of lymph node involvement.  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 It is estrogen and progesterone receptor positive, which means that it is being fed by the hormones that occur naturally within your body.  Please proceed with your scheduled appointment with the surgeon. They will discuss lumpectomy to remove the cancer in its entirety.  Following surgery, you will follow-up with Dr. Delton Coombes approximately 4 weeks after surgery. The most common course of treatment following surgery is radiation therapy to the impacted breast and an anti-estrogen pill to be taken for at least 5 years. These work together to protect both the impacted breast and the entire body from the cancer returning.  Following surgery, Dr. Delton Coombes will request additional testing on the tumor removed to ensure that anti-estrogen therapy is the best course of action. This test is known as Oncotype DX.  Due to your recent diagnosis as well as family history, we will refer you for genetic testing evaluation.  Follow-up as scheduled.  Thank you for choosing [place] [facility] to provide your oncology and hematology care.   To afford each patient quality time with our provider, please arrive at least 15 minutes before your scheduled appointment time. You may need to reschedule your appointment if you arrive late (10 or more minutes). Arriving late affects you and  other patients whose appointments are after yours.  Also, if you miss three or more appointments without notifying the office, you may be dismissed from the clinic at the provider's discretion.    Again, thank you for choosing [facility].  Our hope is that these requests will decrease the amount of time that you wait before being seen by our physicians.   If you have a lab appointment with the [facility] please come in thru the Main Entrance and check in at the main information desk.           _____________________________________________________________  Should you have questions after your visit to [facility], please contact our office at [phone] and follow the prompts.  Our office hours are 8:00 a.m. to 4:30 p.m. Monday - Thursday and 8:00 a.m. to 2:30 p.m. Friday.  Please note that voicemails left after 4:00 p.m. may not be returned until the following business day.  We are closed weekends and all major holidays.  You do have access to a nurse 24-7, just call the main number to the clinic [phone] and do not press any options, hold on the line and a nurse will answer the phone.    For prescription refill requests, have your pharmacy contact our office and allow 72 hours.    Masks are optional in the cancer centers. If you would like for your care team to wear a mask while they are taking care of you, please let them know. You may have one support person who is at least 52 years old accompany you  for your appointments.

## 2022-12-28 ENCOUNTER — Ambulatory Visit (HOSPITAL_COMMUNITY): Payer: BC Managed Care – PPO

## 2022-12-28 ENCOUNTER — Ambulatory Visit: Payer: BC Managed Care – PPO | Admitting: General Surgery

## 2022-12-28 ENCOUNTER — Other Ambulatory Visit (HOSPITAL_COMMUNITY): Payer: Self-pay | Admitting: General Surgery

## 2022-12-28 ENCOUNTER — Encounter: Payer: Self-pay | Admitting: General Surgery

## 2022-12-28 ENCOUNTER — Encounter (HOSPITAL_COMMUNITY): Payer: BC Managed Care – PPO

## 2022-12-28 ENCOUNTER — Other Ambulatory Visit: Payer: Self-pay

## 2022-12-28 VITALS — BP 129/81 | HR 72 | Temp 98.3°F | Resp 96 | Ht 65.0 in | Wt 190.0 lb

## 2022-12-28 DIAGNOSIS — C50412 Malignant neoplasm of upper-outer quadrant of left female breast: Secondary | ICD-10-CM

## 2022-12-28 DIAGNOSIS — R928 Other abnormal and inconclusive findings on diagnostic imaging of breast: Secondary | ICD-10-CM

## 2022-12-28 DIAGNOSIS — Z170 Estrogen receptor positive status [ER+]: Secondary | ICD-10-CM

## 2022-12-28 MED ORDER — ENOXAPARIN SODIUM 40 MG/0.4ML IJ SOSY: 40.0000 mg | PREFILLED_SYRINGE | INTRAMUSCULAR | 0 refills | Status: DC

## 2022-12-28 NOTE — Progress Notes (Unsigned)
[facility] History and Physical  Reason for Referral:*** Referring Physician: ***  Chief Complaint   New Patient (Initial Visit)     Judy Patton is a 52 y.o. female.  HPI: ***.  The *** started *** and has had a duration of ***.  It is associated with ***.  The *** is improved with ***, and is made worse with ***.    Quality*** Context***  Past Medical History:  Diagnosis Date   Anxiety    Chronic headaches    Depression    Factor V Leiden mutation ([place])    Fatigue 02/18/2015   History of kidney stones    Hypothyroidism    MRSA (methicillin resistant Staphylococcus aureus)    Pain of tooth socket 03/14/2015   PONV (postoperative nausea and vomiting)    Pulmonary embolus (HCC)    Restless leg syndrome    Shingles rash 03/14/2015   Shoulder injury    Stress    family   Weight gain 02/18/2015    Past Surgical History:  Procedure Laterality Date   BREAST BIOPSY Left 12/16/2022   [place] LT BREAST BX W LOC DEV 1ST LESION IMG BX SPEC US GUIDE 12/16/2022 [facility]-ULTRASOUND   COLONOSCOPY  03/25/2004   [id number] canal hemorrhoids, otherwise normal rectum,   DILITATION & CURRETTAGE/HYSTROSCOPY WITH NOVASURE ABLATION N/A 11/08/2017   Procedure: DILATATION & CURETTAGE/HYSTEROSCOPY WITH NOVASURE ENDOMETRIAL ABLATION;  Surgeon: Jonnie Kind, MD;  Location: [facility];  Service: Gynecology;  Laterality: N/A;   ESOPHAGOGASTRODUODENOSCOPY  03/25/2004   [id number] esophagus, small hiatal hernia, otherwise normal stomach   kidney stones  05/2014,11/2014   LAPAROSCOPIC BILATERAL SALPINGECTOMY Bilateral 11/08/2017   Procedure: LAPAROSCOPIC BILATERAL SALPINGECTOMY;  Surgeon: Jonnie Kind, MD;  Location: [facility];  Service: Gynecology;  Laterality: Bilateral;   LITHOTRIPSY  2008    Family History  Problem Relation Age of Onset   Cancer Brother        kidney   Factor V Leiden deficiency Brother    Fibromyalgia Brother    Cancer Paternal Grandmother        breast cancer   Cancer  Paternal Grandfather        lung cancer   Stroke Mother    CAD Mother    Cancer Father        prostate    Social History   Tobacco Use   Smoking status: Never   Smokeless tobacco: Never  Vaping Use   Vaping Use: Never used  Substance Use Topics   Alcohol use: Yes    Comment: occ   Drug use: No    Medications: {medication reviewed/display:3041432} Allergies as of 12/28/2022       Reactions   Vicodin [hydrocodone-acetaminophen] Swelling   Has taken hydrocodone since reaction occurred in teenage years        Medication List        Accurate as of December 28, 2022  9:24 AM. If you have any questions, ask your nurse or doctor.          acetaminophen 500 MG tablet Commonly known as: TYLENOL Take 1,000 mg by mouth every 6 (six) hours as needed for moderate pain or headache.   ibuprofen 200 MG tablet Commonly known as: ADVIL Take 400 mg by mouth every 6 (six) hours as needed for headache or moderate pain.   levothyroxine 88 MCG tablet Commonly known as: SYNTHROID TAKE 88 MCG BY MOUTH IN THE MORNING   loratadine 10 MG tablet Commonly known as: CLARITIN  Take 10 mg by mouth daily.   rOPINIRole 2 MG tablet Commonly known as: REQUIP TAKE ONE TABLET BY MOUTH AT BEDTIME   Trintellix 20 MG Tabs tablet Generic drug: vortioxetine HBr Take 20 mg by mouth daily.   trolamine salicylate 10 % cream Commonly known as: ASPERCREME Apply 1 application topically as needed for muscle pain.   Xarelto 20 MG Tabs tablet Generic drug: rivaroxaban Take 20 mg by mouth in the morning         ROS:  {Review of Systems:30496}  Blood pressure 129/81, pulse 72, temperature 98.3 F (36.8 C), temperature source Oral, resp. rate (!) 96, height '5\' 5"'$  (1.651 m), weight 190 lb (86.2 kg). Physical Exam  Results: ADDENDUM REPORT: 12/17/2022 12:27   ADDENDUM: PATHOLOGY revealed: A. BREAST, LEFT, 1 OC MASS , NEEDLE CORE BIOPSY: - Invasive mammary carcinoma (e-cadherin pending).  - Overall Grade: II/III. - Lymphovascular invasion: Not identified. - Cancer Length: 8 mm in greatest linear dimension on fragmented cores. - Calcifications: Present.   Pathology results are CONCORDANT with imaging findings, per Dr. Everlean Alstrom.   Pathology results and recommendations were discussed with patient via telephone on 12/17/2022. Patient reported biopsy site doing well with no adverse symptoms, and only slight tenderness at the site. Post biopsy care instructions were reviewed, questions were answered and my direct phone number was provided. Patient was instructed to call [facility] Mammography Department for any additional questions or concerns related to biopsy site.   RECOMMENDATION: Surgical and oncological consultation. Request for surgical and oncological consultation was relayed to Kathi Der RT at [facility] Mammography Department by Electa Sniff RN on 12/17/2022.   Pathology results reported by Electa Sniff RN on 12/17/2022.     Electronically Signed   By: Everlean Alstrom M.D.   On: 12/17/2022 12:27  CLINICAL DATA:  52 year old female presents for ultrasound-guided core biopsy of a suspicious 1 cm mass in the left breast at 1 o'clock 4 cm from nipple.   EXAM: ULTRASOUND GUIDED LEFT BREAST CORE NEEDLE BIOPSY   COMPARISON:  Previous exam(s).   PROCEDURE: I met with the patient and we discussed the procedure of ultrasound-guided biopsy, including benefits and alternatives. We discussed the high likelihood of a successful procedure. We discussed the risks of the procedure, including infection, bleeding, tissue injury, clip migration, and inadequate sampling. Informed written consent was given. The usual time-out protocol was performed immediately prior to the procedure.   Lesion quadrant: Upper-outer   Using sterile technique and 1% Lidocaine as local anesthetic, under direct ultrasound visualization, a 14 gauge spring-loaded  device was used to perform biopsy of the mass in the left breast at the 1 o'clock position using a medial to lateral approach. At the conclusion of the procedure a ribbon shaped tissue marker clip was deployed into the biopsy cavity. Follow up 2 view mammogram was performed and dictated separately.   IMPRESSION: Ultrasound guided biopsy of the mass in the left breast at the 1 o'clock position. No apparent complications.   Electronically Signed: By: Everlean Alstrom M.D. On: 12/16/2022 13:31     CLINICAL DATA:  Recall from screening mammography, possible architectural distortion involving the UPPER OUTER QUADRANT of the LEFT breast at middle to posterior depth.   EXAM: DIGITAL DIAGNOSTIC UNILATERAL LEFT MAMMOGRAM WITH TOMOSYNTHESIS; ULTRASOUND LEFT BREAST LIMITED   TECHNIQUE: Left digital diagnostic mammography and breast tomosynthesis was performed.; Targeted ultrasound examination of the left breast was performed.   COMPARISON:  Previous  exam(s).   ACR Breast Density Category c: The breasts are heterogeneously dense, which may obscure small masses.   FINDINGS: Spot compression CC and MLO views of the area of concern were obtained.   Persistent architectural distortion likely associated with a mass measuring on the order of 1 cm in size in the UPPER OUTER QUADRANT at middle to posterior depth. No associated suspicious calcifications.   Targeted ultrasound is performed, demonstrating an irregular hypoechoic mass with angular margins at the 1 o'clock position 4 cm from the nipple at middle depth, measuring approximately 1.0 x 0.7 x 0.9 cm, demonstrating posterior acoustic shadowing and demonstrating internal power Doppler flow.   Sonographic evaluation of the LEFT axilla demonstrates no pathologic lymphadenopathy.   IMPRESSION: 1. Highly suspicious 1.0 cm mass involving the UPPER OUTER QUADRANT of the LEFT breast at 1 o'clock 4 cm from nipple. 2. No pathologic LEFT  axillary lymphadenopathy.   RECOMMENDATION: Ultrasound-guided core needle biopsy of the LEFT breast mass.   I have discussed the findings and recommendations with the patient. The ultrasound core needle biopsy procedure was discussed with the patient and her questions were answered. She wishes to proceed with the biopsy which will be scheduled by the Breast Imaging staff.   BI-RADS CATEGORY  5: Highly suggestive of malignancy.     Electronically Signed   By: Evangeline Dakin M.D.   On: 12/14/2022 09:44     Assessment & Plan:  JONE CANNISTRA is a 52 y.o. female with *** -*** -*** -Follow up ***  All questions were answered to the satisfaction of the patient and family***.  The risk and benefits of *** were discussed including but not limited to ***.  After careful consideration, [facility] has decided to ***.    Virl Cagey 12/28/2022, 9:24 AM

## 2022-12-28 NOTE — Patient Instructions (Signed)
Hold your Xarelto on 01/07/23 and on until surgery 01/10/2023. Dr. Delton Coombes has been consulted to see if he thinks you need any lovenox shots in those days. We will let you know if he decides to have you take any.  You will get the tag on 01/04/23 and they do not expect you to hold the Xarelto for this tag.   Surgical Options for Early-Stage Breast Cancer Surgery is usually the first treatment for early-stage breast cancer. There are two surgery options that result in good rates of cancer survival. They include: Partial mastectomy. Mastectomy. Breast cancer is different for everyone, even in its early stage. The best treatment for one person might not be the best treatment for another. Learn as much as you can about your cancer, and work closely with your health care providers to make the choice that produces the best results for you. What is partial mastectomy? Partial mastectomy, also called breast-sparing surgery or breast-conserving surgery, is surgery to remove the cancer along with some normal breast tissue that surrounds it. Lymph nodes from under the arm may also be removed and tested to find out if the cancer has spread. If cancer is located near the chest wall, part of the chest wall lining may also be removed. What is a mastectomy? A mastectomy is surgery to remove the cancer along with the entire breast tissue. There are several types of mastectomy: Simple or total mastectomy. In this surgery, the entire breast is removed, including breast tissue, nipple, areola, and skin around the breast. Some lymph nodes may also be removed from under the arm. If cancer is located near the chest wall, part of the chest wall lining may also be removed. Skin-sparing mastectomy. In this surgery, the breast tissue, nipple, and areola are removed, and most of the skin over the breast is left in place. This surgery results in less scar tissue than other mastectomy surgeries, which allows for a more natural breast  reconstruction. Nipple-sparing mastectomy. In this surgery, breast tissue is removed, but the skin and nipple are left in place. The tissue under the nipple and areola may be removed if cancer is found in the area. This may be an option for those who choose to have breast reconstruction after mastectomy. Modified radical mastectomy. This surgery is the same as a simple mastectomy but also includes removing lymph nodes from under the arm (axillary lymph node dissection). Radical mastectomy. In this surgery, the entire breast, the lymph nodes under the arm, and the chest wall muscles under the breast are removed. This surgery is rarely done now. A modified radical mastectomy is preferred because it is just as effective but with the added advantage of fewer complications. What are some advantages and disadvantages of these surgeries? Partial mastectomy Advantages Keeping most of your breast tissue intact, allowing for a more natural look to the breast. Easier recovery when compared to a mastectomy. Ability to go home on the day of the procedure. Disadvantages Slightly higher risk that your cancer will come back. Needing more surgery at a later time. Requiring radiation therapy after surgery, which has side effects and possible complications. This is done to reduce the chances of breast cancer returning. Mastectomy Advantages Not needing to have radiation therapy or other treatments after surgery. Lower chances of your cancer coming back. Disadvantages Longer recovery time compared to partial mastectomy. Possibility of more complications. Requiring additional surgeries to reconstruct your breast. Questions to ask Here are some questions to ask about each surgery: What  will my recovery be like? How will my breast look and feel? What are the possible risks and complications of the surgery? What additional treatment might I need after surgery? What are the risks and complications of radiation  therapy? What are the risks and complications of chemotherapy? Will I be able to have breast reconstruction? Where to find more information [place]: cancer.gov American Cancer Society: cancer.org Summary Surgery is usually the first treatment for early-stage breast cancer. Two surgery options are available. One surgery option is called partial mastectomy and the other is called mastectomy. Both result in good rates of cancer survival. Each option has advantages and disadvantages to consider. The best treatment for one person might not be the best treatment for you. Learn as much as you can about your cancer, and work closely with your health care providers to make the choice that produces the best results for you. This information is not intended to replace advice given to you by your health care provider. Make sure you discuss any questions you have with your health care provider. Document Revised: 09/30/2021 Document Reviewed: 09/30/2021 Elsevier Patient Education  [place].  Sentinel Lymph Node Biopsy  A sentinel lymph node biopsy is a procedure to identify, remove, and check a lymph node for cancer. Lymph is excess fluid from the tissues in your body. It is removed through the lymphatic system. This system is also a part of your body's defense system (immune system). The system includes lymph nodes and lymph vessels. Certain types of cancer can spread to nearby lymph nodes through the lymphatic system. The cancer usually spreads to one lymph node first, and then to others. The first lymph node that the cancer could spread to is called the sentinel lymph node. In some cases, there may be more than one sentinel lymph node. You may have this procedure to determine whether your cancer has spread and to help your health care provider plan your treatment. If no cancer is found in the sentinel lymph node, it is very unlikely that the cancer has spread to any of your other  lymph nodes. If cancer is found in the sentinel lymph node, additional lymph nodes may be removed for testing. Tell a health care provider about: Any allergies you have, including any history of problems with contrast dye. All medicines you are taking, including vitamins, herbs, eye drops, creams, and over-the-counter medicines. Any problems you or family members have had with anesthetic medicines. Any blood disorders you have. Any surgeries you have had. Any medical conditions you have. Whether you are pregnant or may be pregnant. What are the risks? Generally, this is a safe procedure. However, problems may occur, including: Infection. Bleeding. Allergic reaction to medicines or dyes. Staining of the skin where dye is injected. Damaged lymph vessels, causing a buildup of fluid (lymphedema). Pain or bruising at the biopsy site. What happens before the procedure? Medicines Ask your health care provider about: Changing or stopping your regular medicines. This is especially important if you are taking diabetes medicines or blood thinners. Taking medicines such as aspirin and ibuprofen. These medicines can thin your blood. Do not take these medicines before your procedure unless your health care provider instructs you to do so. Taking over-the-counter medicines, vitamins, herbs, and supplements. Surgery safety Ask your health care provider: How your surgery site will be marked. What steps will be taken to help prevent infection. These steps may include: Removing hair at the surgery site. Washing skin with a  germ-killing soap. Receiving antibiotic medicine. General instructions Do not use any products that contain nicotine or tobacco for at least 2 weeks before the procedure. These products include cigarettes, chewing tobacco, and vaping devices, such as e-cigarettes. If you need help quitting, ask your health care provider. You may have blood tests to make sure your blood clots  normally. Plan to have a responsible adult take you home from the hospital or clinic. What happens during the procedure?  An IV will be inserted into one of your veins. You will be given one or more of the following: A medicine to help you relax (sedative). A medicine to numb the area (local anesthetic). A medicine to make you fall asleep (general anesthetic). Dye will be injected.  The dye will reach your lymph nodes quickly. It may be given just before surgery. Sentinel lymph nodes will be removed and sent to a lab for testing. If no cancer is found, no other lymph nodes will be removed. It is unlikely the cancer has spread. If cancer is found, the surgeon will remove other lymph nodes for testing. This may happen during the same procedure or at a later time. The incision will be closed with stitches (sutures) or skin glue. A small dressing may be taped over the incision area. The procedure may vary among health care providers and hospitals. What happens after the procedure? Your blood pressure, heart rate, breathing rate, and blood oxygen level will be monitored until you leave the hospital or clinic. Your urine or stool may be blue for the next 24-48 hours. This is normal. It is caused by the dye that is used during the procedure. If you were given a sedative during the procedure, it can affect you for several hours. Do not drive or operate machinery until your health care provider says that it is safe. It is up to you to get the results of your procedure. Ask your health care provider, or the department that is doing the procedure, when your results will be ready. Summary A sentinel lymph node biopsy is a procedure to identify, remove, and examine one or more lymph nodes for cancer. If you have cancer, you may have this procedure to determine whether your cancer has spread. If no cancer is found in the sentinel lymph node, it is very unlikely that the cancer has spread to any other lymph  nodes. If cancer is found in the sentinel lymph node, your surgeon may remove additional lymph nodes for testing. This information is not intended to replace advice given to you by your health care provider. Make sure you discuss any questions you have with your health care provider. Document Revised: 07/31/2020 Document Reviewed: 07/31/2020 Elsevier Patient Education  [place].

## 2022-12-29 ENCOUNTER — Other Ambulatory Visit (HOSPITAL_COMMUNITY): Payer: Self-pay | Admitting: General Surgery

## 2022-12-29 DIAGNOSIS — R928 Other abnormal and inconclusive findings on diagnostic imaging of breast: Secondary | ICD-10-CM

## 2022-12-29 NOTE — H&P (Signed)
[facility] History and Physical   Reason for Referral: Left breast cancer  Referring Physician: Candi Leash, PA-C     Chief Complaint   New Patient (Initial Visit)        Judy Patton is a 52 y.o. female.  HPI: Ms. Barson is a 52 yo who had a prior mammogram about 7+ years ago and had her second mammogram recently which found an area of concern. She has had her diagnostic and biopsy and an invasive breast cancer on the left upper quadrant was noted.    The patient has no history of any masses, lumps, bumps, nipple changes or discharge. She had menarche at age 55-13, and her first pregnancy at age 67. She has a history of any family breast cancer in her paternal grandmother.    She has never had any previous biopsies or concerning areas on mammogram.  She has not had any chest radiation.   She had a non-provoked PE years ago and is on Xarelto for this after having a Factor V Leiden mutation.  She has seen Dr. Delton Coombes who is aware of her history.         Past Medical History:  Diagnosis Date   Anxiety     Chronic headaches     Depression     Factor V Leiden mutation ([place])     Fatigue 02/18/2015   History of kidney stones     Hypothyroidism     MRSA (methicillin resistant Staphylococcus aureus)     Pain of tooth socket 03/14/2015   PONV (postoperative nausea and vomiting)     Pulmonary embolus (HCC)     Restless leg syndrome     Shingles rash 03/14/2015   Shoulder injury     Stress      family   Weight gain 02/18/2015           Past Surgical History:  Procedure Laterality Date   BREAST BIOPSY Left 12/16/2022    [place] LT BREAST BX W LOC DEV 1ST LESION IMG BX SPEC US GUIDE 12/16/2022 [facility]UL[facility]   COLONOSCOPY   03/25/2004    [id number] canal hemorrhoids, otherwise normal rectum,   DILITATION & CURRETTAGE/HYSTROSCOPY WITH NOVASURE ABLATION N/A 11/08/2017    Procedure: DILATATION & CURETTAGE/HYSTEROSCOPY WITH NOVASURE ENDOMETRIAL ABLATION;  Surgeon: Jonnie Kind, MD;  Location: [facility];  Service: Gynecology;  Laterality: N/A;   ESOPHAGOGASTRODUODENOSCOPY   03/25/2004    [id number] esophagus, small hiatal hernia, otherwise normal stomach   kidney stones   05/2014,11/2014   LAPAROSCOPIC BILATERAL SALPINGECTOMY Bilateral 11/08/2017    Procedure: LAPAROSCOPIC BILATERAL SALPINGECTOMY;  Surgeon: Jonnie Kind, MD;  Location: [facility];  Service: Gynecology;  Laterality: Bilateral;   LITHOTRIPSY   2008           Family History  Problem Relation Age of Onset   Cancer Brother          kidney   Factor V Leiden deficiency Brother     Fibromyalgia Brother     Cancer Paternal Grandmother          breast cancer   Cancer Paternal Grandfather          lung cancer   Stroke Mother     CAD Mother     Cancer Father          prostate      Social History         Tobacco Use   Smoking status: Never   Smokeless  tobacco: Never  Vaping Use   Vaping Use: Never used  Substance Use Topics   Alcohol use: Yes      Comment: occ   Drug use: No      Medications: I have reviewed the patient's current medications. Allergies as of 12/28/2022         Reactions    Vicodin [hydrocodone-acetaminophen] Swelling    Has taken hydrocodone since reaction occurred in teenage years            Medication List           Accurate as of December 28, 2022  9:24 AM. If you have any questions, ask your nurse or doctor.              acetaminophen 500 MG tablet Commonly known as: TYLENOL Take 1,000 mg by mouth every 6 (six) hours as needed for moderate pain or headache.    ibuprofen 200 MG tablet Commonly known as: ADVIL Take 400 mg by mouth every 6 (six) hours as needed for headache or moderate pain.    levothyroxine 88 MCG tablet Commonly known as: SYNTHROID TAKE 88 MCG BY MOUTH IN THE MORNING    loratadine 10 MG tablet Commonly known as: CLARITIN Take 10 mg by mouth daily.    rOPINIRole 2 MG tablet Commonly known as: REQUIP TAKE ONE TABLET BY MOUTH  AT BEDTIME    Trintellix 20 MG Tabs tablet Generic drug: vortioxetine HBr Take 20 mg by mouth daily.    trolamine salicylate 10 % cream Commonly known as: ASPERCREME Apply 1 application topically as needed for muscle pain.    Xarelto 20 MG Tabs tablet Generic drug: rivaroxaban Take 20 mg by mouth in the morning               ROS:  A comprehensive review of systems was negative except for: Ears, nose, mouth, throat, and face: positive for sinus issues Gastrointestinal: positive for dull abdominal pain at times Hematologic/lymphatic: positive for clotting history   Blood pressure 129/81, pulse 72, temperature 98.3 F (36.8 C), temperature source Oral, resp. rate (!) 96, height '5\' 5"'$  (1.651 m), weight 190 lb (86.2 kg). Physical Exam Vitals reviewed.  Constitutional:      Appearance: Normal appearance.  HENT:     Head: Normocephalic.     Nose: Nose normal.  Eyes:     Extraocular Movements: Extraocular movements intact.     Pupils: Pupils are equal, round, and reactive to light.  Cardiovascular:     Rate and Rhythm: Normal rate and regular rhythm.  Pulmonary:     Effort: Pulmonary effort is normal.     Breath sounds: Normal breath sounds.  Abdominal:     General: There is no distension.     Palpations: Abdomen is soft.     Tenderness: There is no abdominal tenderness.  Musculoskeletal:        General: Normal range of motion.     Cervical back: Normal range of motion.  Skin:    General: Skin is warm.  Neurological:     General: No focal deficit present.     Mental Status: She is alert and oriented to person, place, and time.  Psychiatric:        Mood and Affect: Mood normal.        Behavior: Behavior normal.        Thought Content: Thought content normal.        Results: ADDENDUM REPORT: 12/17/2022 12:27   ADDENDUM: PATHOLOGY  revealed: A. BREAST, LEFT, 1 OC MASS , NEEDLE CORE BIOPSY: - Invasive mammary carcinoma (e-cadherin pending). - Overall  Grade: II/III. - Lymphovascular invasion: Not identified. - Cancer Length: 8 mm in greatest linear dimension on fragmented cores. - Calcifications: Present.   Pathology results are CONCORDANT with imaging findings, per Dr. Everlean Alstrom.   Pathology results and recommendations were discussed with patient via telephone on 12/17/2022. Patient reported biopsy site doing well with no adverse symptoms, and only slight tenderness at the site. Post biopsy care instructions were reviewed, questions were answered and my direct phone number was provided. Patient was instructed to call [facility] Mammography Department for any additional questions or concerns related to biopsy site.   RECOMMENDATION: Surgical and oncological consultation. Request for surgical and oncological consultation was relayed to Kathi Der RT at [facility] Mammography Department by Electa Sniff RN on 12/17/2022.   Pathology results reported by Electa Sniff RN on 12/17/2022.     Electronically Signed   By: Everlean Alstrom M.D.   On: 12/17/2022 12:27   CLINICAL DATA:  52 year old female presents for ultrasound-guided core biopsy of a suspicious 1 cm mass in the left breast at 1 o'clock 4 cm from nipple.   EXAM: ULTRASOUND GUIDED LEFT BREAST CORE NEEDLE BIOPSY   COMPARISON:  Previous exam(s).   PROCEDURE: I met with the patient and we discussed the procedure of ultrasound-guided biopsy, including benefits and alternatives. We discussed the high likelihood of a successful procedure. We discussed the risks of the procedure, including infection, bleeding, tissue injury, clip migration, and inadequate sampling. Informed written consent was given. The usual time-out protocol was performed immediately prior to the procedure.   Lesion quadrant: Upper-outer   Using sterile technique and 1% Lidocaine as local anesthetic, under direct ultrasound visualization, a 14 gauge spring-loaded device was used  to perform biopsy of the mass in the left breast at the 1 o'clock position using a medial to lateral approach. At the conclusion of the procedure a ribbon shaped tissue marker clip was deployed into the biopsy cavity. Follow up 2 view mammogram was performed and dictated separately.   IMPRESSION: Ultrasound guided biopsy of the mass in the left breast at the 1 o'clock position. No apparent complications.   Electronically Signed: By: Everlean Alstrom M.D. On: 12/16/2022 13:31     CLINICAL DATA:  Recall from screening mammography, possible architectural distortion involving the UPPER OUTER QUADRANT of the LEFT breast at middle to posterior depth.   EXAM: DIGITAL DIAGNOSTIC UNILATERAL LEFT MAMMOGRAM WITH TOMOSYNTHESIS; ULTRASOUND LEFT BREAST LIMITED   TECHNIQUE: Left digital diagnostic mammography and breast tomosynthesis was performed.; Targeted ultrasound examination of the left breast was performed.   COMPARISON:  Previous exam(s).   ACR Breast Density Category c: The breasts are heterogeneously dense, which may obscure small masses.   FINDINGS: Spot compression CC and MLO views of the area of concern were obtained.   Persistent architectural distortion likely associated with a mass measuring on the order of 1 cm in size in the UPPER OUTER QUADRANT at middle to posterior depth. No associated suspicious calcifications.   Targeted ultrasound is performed, demonstrating an irregular hypoechoic mass with angular margins at the 1 o'clock position 4 cm from the nipple at middle depth, measuring approximately 1.0 x 0.7 x 0.9 cm, demonstrating posterior acoustic shadowing and demonstrating internal power Doppler flow.   Sonographic evaluation of the LEFT axilla demonstrates no pathologic lymphadenopathy.   IMPRESSION: 1. Highly suspicious  1.0 cm mass involving the UPPER OUTER QUADRANT of the LEFT breast at 1 o'clock 4 cm from nipple. 2. No pathologic LEFT axillary lymphadenopathy.    RECOMMENDATION: Ultrasound-guided core needle biopsy of the LEFT breast mass.   I have discussed the findings and recommendations with the patient. The ultrasound core needle biopsy procedure was discussed with the patient and her questions were answered. She wishes to proceed with the biopsy which will be scheduled by the Breast Imaging staff.   BI-RADS CATEGORY  5: Highly suggestive of malignancy.     Electronically Signed   By: Evangeline Dakin M.D.   On: 12/14/2022 09:44     Assessment & Plan:  PATTIE STINE is a 52 y.o. female with a left sided breast cancer.    We have discussed the options for surgery including the option of mastectomy with sentinel node biopsy versus partial mastectomy (lumpectomy) with sentinel node biopsy. We have discussed that there is no difference in the prognosis or chance or recurrence or differences in survival between the two options. We have discussed the need for radiation with the lumpectomy, and we have discussed that she will be referred to oncology after our procedure to further discuss her options for chemotherapy and hormonal therapy if she qualifies.    We have discussed that if she decides to have a lumpectomy that we will need to get a radiofrequency tag or needle placed into the area where the biopsy was performed, since we cannot palpate a mass. We have also discussed the need for injection of radiotracer and blue dye to perform the sentinel node biopsy.   We have discussed that the sentinel node biopsy tells [place] if the cancer has spread to the lymph nodes, and can help with plans for chemotherapy treatment and overall prognosis.     We have discussed that if the lumpectomy does not remove the entire cancer that she may have to have an additional procedure, and we have discussed that a positive sentinel node can require further removal of lymph nodes from the axilla but that recent research does not show any improvement in disease free survival  and carries greater risk for lymphedema.     We have discussed that these are big discussions, and that the risk from the operations are similar including risk of bleeding, risk of infection, and risk of needing additional surgeries. We have discussed the likely need for an overnight stay with a mastectomy and a drain that will remain in place for about 1 week.     I discussed case again with Dr. Delton Coombes and he recommended lovenox 40 mg daily for the 3 days the patient is holding her blood thinner prior to surgery.    All questions were answered to the satisfaction of the patient and family.       Virl Cagey 12/28/2022, 9:24 AM

## 2023-01-04 ENCOUNTER — Ambulatory Visit (HOSPITAL_COMMUNITY)
Admission: RE | Admit: 2023-01-04 | Discharge: 2023-01-04 | Disposition: A | Discharge status: Home / Self Care | Payer: BC Managed Care – PPO | Source: Ambulatory Visit | Attending: Hematology | Admitting: Hematology

## 2023-01-04 ENCOUNTER — Ambulatory Visit (HOSPITAL_COMMUNITY)
Admission: RE | Admit: 2023-01-04 | Discharge: 2023-01-04 | Disposition: A | Discharge status: Home / Self Care | Payer: BC Managed Care – PPO | Source: Ambulatory Visit | Attending: General Surgery | Admitting: General Surgery

## 2023-01-04 ENCOUNTER — Encounter (HOSPITAL_COMMUNITY): Payer: Self-pay

## 2023-01-04 DIAGNOSIS — C50412 Malignant neoplasm of upper-outer quadrant of left female breast: Secondary | ICD-10-CM | POA: Insufficient documentation

## 2023-01-04 DIAGNOSIS — R928 Other abnormal and inconclusive findings on diagnostic imaging of breast: Secondary | ICD-10-CM

## 2023-01-04 DIAGNOSIS — Z170 Estrogen receptor positive status [ER+]: Secondary | ICD-10-CM | POA: Diagnosis present

## 2023-01-04 HISTORY — PX: BREAST BIOPSY: SHX20

## 2023-01-04 MED ORDER — LIDOCAINE HCL (PF) 2 % IJ SOLN
INTRAMUSCULAR | Status: AC
  Administered 2023-01-04: 10 mL
  Filled 2023-01-04: qty 10

## 2023-01-04 NOTE — Progress Notes (Signed)
PT tolerated left breast tag placement well today with NAD noted. PT verbalized understanding of discharge instructions. PT ambulated back to the mammogram area this time and given ice pack.

## 2023-01-05 NOTE — Patient Instructions (Signed)
Judy Patton [facility]  01/05/2023     '@[email or web address]'$ @   Your procedure is scheduled on  01/10/2023.   Report to [facility] at  0600  A.M.   Call this number if you have problems the morning of surgery:  [phone]  If you experience any cold or flu symptoms such as cough, fever, chills, shortness of breath, etc. between now and your scheduled surgery, please notify us at the above number.   Remember:  Do not eat or drink after midnight.    Your last dose of xarelto should be taken on 01/06/2023.         Follow instructions given you by Dr Raliegh Ip concerning lovenox.    Take these medicines the morning of surgery with A SIP OF WATER                   levothyroxine, claritin, trintellix.     Do not wear jewelry, make-up or nail polish.  Do not wear lotions, powders, or perfumes, or deodorant.  Do not shave 48 hours prior to surgery.  Men may shave face and neck.  Do not bring valuables to the hospital.  [facility] is not responsible for any belongings or valuables.  Contacts, dentures or bridgework may not be worn into surgery.  Leave your suitcase in the car.  After surgery it may be brought to your room.  For patients admitted to the hospital, discharge time will be determined by your treatment team.  Patients discharged the day of surgery will not be allowed to drive home and must have someone with them for 24 hours.    Special instructions:   DO NOT smoke tobacco or vape for 24 hours before your procedure.  Please read over the following fact sheets that you were given. Coughing and Deep Breathing, Surgical Site Infection Prevention, Anesthesia Post-op Instructions, and Care and Recovery After Surgery      Sentinel Lymph Node Biopsy, Care After The following information offers guidance on how to care for yourself after your procedure. Your health care provider may also give you more specific instructions. If you have problems or questions, contact your health  care provider. What can I expect after the procedure? After the procedure, it is common to have: Blue urine or stool for the next 24-48 hours. This is normal. It is caused by the dye used during the procedure. Blue skin at the injection site. This may last for up to 8 weeks. Numbness, tingling, or pain near your incision site. Swelling or bruising near your incision. Follow these instructions at home: Incision care     Follow instructions from your health care provider about how to take care of your incision. Make sure you: Wash your hands with soap and water for at least 20 seconds before and after you change your bandage (dressing). If soap and water are not available, use hand sanitizer. Change your dressing as told by your health care provider. Leave stitches (sutures), skin glue, or adhesive strips in place. These skin closures may need to stay in place for 2 weeks or longer. If adhesive strip edges start to loosen and curl up, you may trim the loose edges. Do not remove adhesive strips completely unless your health care provider tells you to do that. Check your incision area every day for signs of infection. Check for: Redness, swelling, or more pain. Fluid or blood. Warmth. Pus or a bad smell. Do not take  baths, swim, or use a hot tub until your health care provider approves. Ask your health care provider if you can take showers. You may be able to shower 24 hours after your procedure. After a shower, pat the incision area dry with a clean towel. Do not rub the incision. That could cause bleeding. Activity Avoid activities that take a lot of effort. If you were given a sedative during the procedure, it can affect you for several hours. Do not drive or operate machinery until your health care provider says that it is safe. Return to your normal activities as told by your health care provider. Ask your health care provider what activities are safe for you. General instructions Take  over-the-counter and prescription medicines only as told by your health care provider. You may resume your regular diet. If the procedure was done on or near the lymph nodes under your arm (axillary lymph nodes), do not have your blood pressure taken or have blood drawn from the arm on the side of the biopsy until your health care provider says it is okay. You may need to be screened for extra fluid around the lymph nodes (lymphedema). Follow instructions from your health care provider about how often you should be checked. Keep all follow-up visits. This is important. Contact a health care provider if: Your pain medicine is not helping. You have nausea and vomiting. You have redness, swelling, or more pain around your biopsy site. You have fluid, blood, pus, or a bad smell coming from your incision. Your incision feels warm to the touch, you have a fever, or chills. You have any new bruising. Get help right away if: You have pain that is getting worse, and your medicine is not helping. You have vomiting that will not stop. You have chest pain or trouble breathing. These symptoms may represent a serious problem that is an emergency. Do not wait to see if the symptoms will go away. Get medical help right away. Call your local emergency services (911 in the [place] Do not drive yourself to the hospital. Summary After the procedure, it is common to have blue urine or stool for the next 24-48 hours. Take over-the-counter and prescription medicines only as told by your health care provider. Follow instructions from your health care provider about how to take care of your incision. Check your incision area every day for signs of infection. Get help right away if you have chest pain or trouble breathing. This information is not intended to replace advice given to you by your health care provider. Make sure you discuss any questions you have with your health care provider. Document Revised: 07/31/2020  Document Reviewed: 07/31/2020 Elsevier Patient Education  [place] Anesthesia, Adult, Care After The following information offers guidance on how to care for yourself after your procedure. Your health care provider may also give you more specific instructions. If you have problems or questions, contact your health care provider. What can I expect after the procedure? After the procedure, it is common for people to: Have pain or discomfort at the IV site. Have nausea or vomiting. Have a sore throat or hoarseness. Have trouble concentrating. Feel cold or chills. Feel weak, sleepy, or tired (fatigue). Have soreness and body aches. These can affect parts of the body that were not involved in surgery. Follow these instructions at home: For the time period you were told by your health care provider:  Rest. Do not participate in activities where you could  fall or become injured. Do not drive or use machinery. Do not drink alcohol. Do not take sleeping pills or medicines that cause drowsiness. Do not make important decisions or sign legal documents. Do not take care of children on your own. General instructions Drink enough fluid to keep your urine pale yellow. If you have sleep apnea, surgery and certain medicines can increase your risk for breathing problems. Follow instructions from your health care provider about wearing your sleep device: Anytime you are sleeping, including during daytime naps. While taking prescription pain medicines, sleeping medicines, or medicines that make you drowsy. Return to your normal activities as told by your health care provider. Ask your health care provider what activities are safe for you. Take over-the-counter and prescription medicines only as told by your health care provider. Do not use any products that contain nicotine or tobacco. These products include cigarettes, chewing tobacco, and vaping devices, such as e-cigarettes. These can  delay incision healing after surgery. If you need help quitting, ask your health care provider. Contact a health care provider if: You have nausea or vomiting that does not get better with medicine. You vomit every time you eat or drink. You have pain that does not get better with medicine. You cannot urinate or have bloody urine. You develop a skin rash. You have a fever. Get help right away if: You have trouble breathing. You have chest pain. You vomit blood. These symptoms may be an emergency. Get help right away. Call 911. Do not wait to see if the symptoms will go away. Do not drive yourself to the hospital. Summary After the procedure, it is common to have a sore throat, hoarseness, nausea, vomiting, or to feel weak, sleepy, or fatigue. For the time period you were told by your health care provider, do not drive or use machinery. Get help right away if you have difficulty breathing, have chest pain, or vomit blood. These symptoms may be an emergency. This information is not intended to replace advice given to you by your health care provider. Make sure you discuss any questions you have with your health care provider. Document Revised: 01/15/2022 Document Reviewed: 01/15/2022 Elsevier Patient Education  [place]. How to Use Chlorhexidine Before Surgery Chlorhexidine gluconate (CHG) is a germ-killing (antiseptic) solution that is used to clean the skin. It can get rid of the bacteria that normally live on the skin and can keep them away for about 24 hours. To clean your skin with CHG, you may be given: A CHG solution to use in the shower or as part of a sponge bath. A prepackaged cloth that contains CHG. Cleaning your skin with CHG may help lower the risk for infection: While you are staying in the intensive care unit of the hospital. If you have a vascular access, such as a central line, to provide short-term or long-term access to your veins. If you have a catheter to  drain urine from your bladder. If you are on a ventilator. A ventilator is a machine that helps you breathe by moving air in and out of your lungs. After surgery. What are the risks? Risks of using CHG include: A skin reaction. Hearing loss, if CHG gets in your ears and you have a perforated eardrum. Eye injury, if CHG gets in your eyes and is not rinsed out. The CHG product catching fire. Make sure that you avoid smoking and flames after applying CHG to your skin. Do not use CHG: If you have  a chlorhexidine allergy or have previously reacted to chlorhexidine. On babies younger than 63 months of age. How to use CHG solution Use CHG only as told by your health care provider, and follow the instructions on the label. Use the full amount of CHG as directed. Usually, this is one bottle. During a shower Follow these steps when using CHG solution during a shower (unless your health care provider gives you different instructions): Start the shower. Use your normal soap and shampoo to wash your face and hair. Turn off the shower or move out of the shower stream. Pour the CHG onto a clean washcloth. Do not use any type of brush or rough-edged sponge. Starting at your neck, lather your body down to your toes. Make sure you follow these instructions: If you will be having surgery, pay special attention to the part of your body where you will be having surgery. Scrub this area for at least 1 minute. Do not use CHG on your head or face. If the solution gets into your ears or eyes, rinse them well with water. Avoid your genital area. Avoid any areas of skin that have broken skin, cuts, or scrapes. Scrub your back and under your arms. Make sure to wash skin folds. Let the lather sit on your skin for 1-2 minutes or as long as told by your health care provider. Thoroughly rinse your entire body in the shower. Make sure that all body creases and crevices are rinsed well. Dry off with a clean towel. Do not  put any substances on your body afterward--such as powder, lotion, or perfume--unless you are told to do so by your health care provider. Only use lotions that are recommended by the manufacturer. Put on clean clothes or pajamas. If it is the night before your surgery, sleep in clean sheets.  During a sponge bath Follow these steps when using CHG solution during a sponge bath (unless your health care provider gives you different instructions): Use your normal soap and shampoo to wash your face and hair. Pour the CHG onto a clean washcloth. Starting at your neck, lather your body down to your toes. Make sure you follow these instructions: If you will be having surgery, pay special attention to the part of your body where you will be having surgery. Scrub this area for at least 1 minute. Do not use CHG on your head or face. If the solution gets into your ears or eyes, rinse them well with water. Avoid your genital area. Avoid any areas of skin that have broken skin, cuts, or scrapes. Scrub your back and under your arms. Make sure to wash skin folds. Let the lather sit on your skin for 1-2 minutes or as long as told by your health care provider. Using a different clean, wet washcloth, thoroughly rinse your entire body. Make sure that all body creases and crevices are rinsed well. Dry off with a clean towel. Do not put any substances on your body afterward--such as powder, lotion, or perfume--unless you are told to do so by your health care provider. Only use lotions that are recommended by the manufacturer. Put on clean clothes or pajamas. If it is the night before your surgery, sleep in clean sheets. How to use CHG prepackaged cloths Only use CHG cloths as told by your health care provider, and follow the instructions on the label. Use the CHG cloth on clean, dry skin. Do not use the CHG cloth on your head or face unless your  health care provider tells you to. When washing with the CHG  cloth: Avoid your genital area. Avoid any areas of skin that have broken skin, cuts, or scrapes. Before surgery Follow these steps when using a CHG cloth to clean before surgery (unless your health care provider gives you different instructions): Using the CHG cloth, vigorously scrub the part of your body where you will be having surgery. Scrub using a back-and-forth motion for 3 minutes. The area on your body should be completely wet with CHG when you are done scrubbing. Do not rinse. Discard the cloth and let the area air-dry. Do not put any substances on the area afterward, such as powder, lotion, or perfume. Put on clean clothes or pajamas. If it is the night before your surgery, sleep in clean sheets.  For general bathing Follow these steps when using CHG cloths for general bathing (unless your health care provider gives you different instructions). Use a separate CHG cloth for each area of your body. Make sure you wash between any folds of skin and between your fingers and toes. Wash your body in the following order, switching to a new cloth after each step: The front of your neck, shoulders, and chest. Both of your arms, under your arms, and your hands. Your stomach and groin area, avoiding the genitals. Your right leg and foot. Your left leg and foot. The back of your neck, your back, and your buttocks. Do not rinse. Discard the cloth and let the area air-dry. Do not put any substances on your body afterward--such as powder, lotion, or perfume--unless you are told to do so by your health care provider. Only use lotions that are recommended by the manufacturer. Put on clean clothes or pajamas. Contact a health care provider if: Your skin gets irritated after scrubbing. You have questions about using your solution or cloth. You swallow any chlorhexidine. Call your local poison control center (1-628-690-7167 in the [place] Get help right away if: Your eyes itch badly, or they become  very red or swollen. Your skin itches badly and is red or swollen. Your hearing changes. You have trouble seeing. You have swelling or tingling in your mouth or throat. You have trouble breathing. These symptoms may represent a serious problem that is an emergency. Do not wait to see if the symptoms will go away. Get medical help right away. Call your local emergency services (911 in the [place] Do not drive yourself to the hospital. Summary Chlorhexidine gluconate (CHG) is a germ-killing (antiseptic) solution that is used to clean the skin. Cleaning your skin with CHG may help to lower your risk for infection. You may be given CHG to use for bathing. It may be in a bottle or in a prepackaged cloth to use on your skin. Carefully follow your health care provider's instructions and the instructions on the product label. Do not use CHG if you have a chlorhexidine allergy. Contact your health care provider if your skin gets irritated after scrubbing. This information is not intended to replace advice given to you by your health care provider. Make sure you discuss any questions you have with your health care provider. Document Revised: 02/15/2022 Document Reviewed: 12/29/2020 Elsevier Patient Education  [place].

## 2023-01-06 ENCOUNTER — Encounter (HOSPITAL_COMMUNITY)
Admission: RE | Admit: 2023-01-06 | Discharge: 2023-01-06 | Disposition: A | Discharge status: Home / Self Care | Payer: BC Managed Care – PPO | Source: Ambulatory Visit | Attending: General Surgery | Admitting: General Surgery

## 2023-01-06 ENCOUNTER — Encounter (HOSPITAL_COMMUNITY): Payer: Self-pay

## 2023-01-06 VITALS — BP 134/80 | HR 72 | Temp 98.3°F | Resp 18 | Ht 65.0 in | Wt 190.0 lb

## 2023-01-06 DIAGNOSIS — Z01812 Encounter for preprocedural laboratory examination: Secondary | ICD-10-CM | POA: Insufficient documentation

## 2023-01-06 DIAGNOSIS — Z01818 Encounter for other preprocedural examination: Secondary | ICD-10-CM

## 2023-01-06 HISTORY — DX: Acute pancreatitis without necrosis or infection, unspecified: K85.90

## 2023-01-06 LAB — POCT PREGNANCY, URINE: Preg Test, Ur: NEGATIVE

## 2023-01-10 ENCOUNTER — Ambulatory Visit (HOSPITAL_COMMUNITY)
Admission: RE | Admit: 2023-01-10 | Discharge: 2023-01-10 | Disposition: A | Discharge status: Home / Self Care | Payer: BC Managed Care – PPO | Attending: General Surgery | Admitting: General Surgery

## 2023-01-10 ENCOUNTER — Encounter (HOSPITAL_COMMUNITY)
Admission: RE | Disposition: A | Discharge status: Home / Self Care | Payer: Self-pay | Source: Home / Self Care | Attending: General Surgery

## 2023-01-10 ENCOUNTER — Other Ambulatory Visit: Payer: Self-pay

## 2023-01-10 ENCOUNTER — Ambulatory Visit (HOSPITAL_COMMUNITY): Payer: BC Managed Care – PPO | Admitting: Anesthesiology

## 2023-01-10 ENCOUNTER — Other Ambulatory Visit (HOSPITAL_COMMUNITY): Payer: BC Managed Care – PPO

## 2023-01-10 ENCOUNTER — Encounter (HOSPITAL_COMMUNITY): Payer: Self-pay | Admitting: General Surgery

## 2023-01-10 ENCOUNTER — Ambulatory Visit (HOSPITAL_COMMUNITY): Payer: BC Managed Care – PPO

## 2023-01-10 DIAGNOSIS — D6851 Activated protein C resistance: Secondary | ICD-10-CM | POA: Insufficient documentation

## 2023-01-10 DIAGNOSIS — Z86711 Personal history of pulmonary embolism: Secondary | ICD-10-CM | POA: Diagnosis not present

## 2023-01-10 DIAGNOSIS — C50412 Malignant neoplasm of upper-outer quadrant of left female breast: Secondary | ICD-10-CM | POA: Diagnosis present

## 2023-01-10 DIAGNOSIS — Z803 Family history of malignant neoplasm of breast: Secondary | ICD-10-CM | POA: Insufficient documentation

## 2023-01-10 DIAGNOSIS — E039 Hypothyroidism, unspecified: Secondary | ICD-10-CM | POA: Insufficient documentation

## 2023-01-10 DIAGNOSIS — Z7901 Long term (current) use of anticoagulants: Secondary | ICD-10-CM | POA: Insufficient documentation

## 2023-01-10 DIAGNOSIS — C773 Secondary and unspecified malignant neoplasm of axilla and upper limb lymph nodes: Secondary | ICD-10-CM | POA: Insufficient documentation

## 2023-01-10 DIAGNOSIS — F418 Other specified anxiety disorders: Secondary | ICD-10-CM | POA: Diagnosis not present

## 2023-01-10 DIAGNOSIS — Z170 Estrogen receptor positive status [ER+]: Secondary | ICD-10-CM | POA: Insufficient documentation

## 2023-01-10 DIAGNOSIS — R928 Other abnormal and inconclusive findings on diagnostic imaging of breast: Secondary | ICD-10-CM

## 2023-01-10 HISTORY — PX: PARTIAL MASTECTOMY WITH AXILLARY SENTINEL LYMPH NODE BIOPSY: SHX6004

## 2023-01-10 SURGERY — PARTIAL MASTECTOMY WITH AXILLARY SENTINEL LYMPH NODE BIOPSY
Anesthesia: General | Site: Breast | Laterality: Left

## 2023-01-10 MED ORDER — FENTANYL CITRATE PF 50 MCG/ML IJ SOSY
25.0000 ug | PREFILLED_SYRINGE | INTRAMUSCULAR | Status: DC | PRN
  Administered 2023-01-10: 50 ug via INTRAVENOUS
  Filled 2023-01-10: qty 1

## 2023-01-10 MED ORDER — ONDANSETRON HCL 4 MG/2ML IJ SOLN
INTRAMUSCULAR | Status: AC
  Filled 2023-01-10: qty 2

## 2023-01-10 MED ORDER — ROCURONIUM BROMIDE 10 MG/ML (PF) SYRINGE
PREFILLED_SYRINGE | INTRAVENOUS | Status: AC
  Filled 2023-01-10: qty 10

## 2023-01-10 MED ORDER — PROPOFOL 10 MG/ML IV BOLUS
INTRAVENOUS | Status: AC
  Filled 2023-01-10: qty 20

## 2023-01-10 MED ORDER — CEFAZOLIN SODIUM-DEXTROSE 2-4 GM/100ML-% IV SOLN
2.0000 g | INTRAVENOUS | Status: AC
  Administered 2023-01-10: 2 g via INTRAVENOUS

## 2023-01-10 MED ORDER — SODIUM CHLORIDE (PF) 0.9 % IJ SOLN
INTRAMUSCULAR | Status: AC
  Filled 2023-01-10: qty 10

## 2023-01-10 MED ORDER — CHLORHEXIDINE GLUCONATE CLOTH 2 % EX PADS: 6.0000 | MEDICATED_PAD | Freq: Once | CUTANEOUS | Status: DC

## 2023-01-10 MED ORDER — 0.9 % SODIUM CHLORIDE (POUR BTL) OPTIME
TOPICAL | Status: DC | PRN
  Administered 2023-01-10: 1000 mL

## 2023-01-10 MED ORDER — DEXAMETHASONE SODIUM PHOSPHATE 10 MG/ML IJ SOLN
INTRAMUSCULAR | Status: AC
  Filled 2023-01-10: qty 1

## 2023-01-10 MED ORDER — ROCURONIUM BROMIDE 100 MG/10ML IV SOLN
INTRAVENOUS | Status: DC | PRN
  Administered 2023-01-10: 60 mg via INTRAVENOUS
  Administered 2023-01-10 (×2): 20 mg via INTRAVENOUS

## 2023-01-10 MED ORDER — SUGAMMADEX SODIUM 500 MG/5ML IV SOLN
INTRAVENOUS | Status: DC | PRN
  Administered 2023-01-10: 200 mg via INTRAVENOUS

## 2023-01-10 MED ORDER — OXYCODONE HCL 5 MG PO TABS: 5.0000 mg | ORAL_TABLET | Freq: Once | ORAL | Status: DC | PRN

## 2023-01-10 MED ORDER — METHYLENE BLUE 1 % INJ SOLN
INTRAVENOUS | Status: AC
  Filled 2023-01-10: qty 10

## 2023-01-10 MED ORDER — ONDANSETRON HCL 4 MG/2ML IJ SOLN
INTRAMUSCULAR | Status: DC | PRN
  Administered 2023-01-10: 4 mg via INTRAVENOUS

## 2023-01-10 MED ORDER — CEFAZOLIN SODIUM-DEXTROSE 2-4 GM/100ML-% IV SOLN
INTRAVENOUS | Status: AC
  Filled 2023-01-10: qty 100

## 2023-01-10 MED ORDER — MAGTRACE LYMPHATIC TRACER
INTRAMUSCULAR | Status: DC | PRN
  Administered 2023-01-10: 3 mL via INTRAMUSCULAR

## 2023-01-10 MED ORDER — ONDANSETRON HCL 4 MG/2ML IJ SOLN: 4.0000 mg | Freq: Once | INTRAMUSCULAR | Status: DC | PRN

## 2023-01-10 MED ORDER — PROPOFOL 10 MG/ML IV BOLUS
INTRAVENOUS | Status: DC | PRN
  Administered 2023-01-10: 50 mg via INTRAVENOUS
  Administered 2023-01-10: 150 mg via INTRAVENOUS

## 2023-01-10 MED ORDER — OXYCODONE HCL 5 MG/5ML PO SOLN: 5.0000 mg | Freq: Once | ORAL | Status: DC | PRN

## 2023-01-10 MED ORDER — BUPIVACAINE HCL (PF) 0.5 % IJ SOLN
INTRAMUSCULAR | Status: AC
  Filled 2023-01-10: qty 30

## 2023-01-10 MED ORDER — LIDOCAINE HCL (PF) 2 % IJ SOLN
INTRAMUSCULAR | Status: AC
  Filled 2023-01-10: qty 5

## 2023-01-10 MED ORDER — FENTANYL CITRATE (PF) 250 MCG/5ML IJ SOLN
INTRAMUSCULAR | Status: AC
  Filled 2023-01-10: qty 5

## 2023-01-10 MED ORDER — BUPIVACAINE HCL (PF) 0.5 % IJ SOLN
INTRAMUSCULAR | Status: DC | PRN
  Administered 2023-01-10: 30 mL

## 2023-01-10 MED ORDER — ONDANSETRON HCL 4 MG PO TABS: 4.0000 mg | ORAL_TABLET | Freq: Three times a day (TID) | ORAL | 1 refills | Status: AC | PRN

## 2023-01-10 MED ORDER — LACTATED RINGERS IV SOLN: INTRAVENOUS | Status: DC

## 2023-01-10 MED ORDER — LIDOCAINE HCL (CARDIAC) PF 100 MG/5ML IV SOSY
PREFILLED_SYRINGE | INTRAVENOUS | Status: DC | PRN
  Administered 2023-01-10: 60 mg via INTRAVENOUS

## 2023-01-10 MED ORDER — ORAL CARE MOUTH RINSE: 15.0000 mL | Freq: Once | OROMUCOSAL | Status: DC

## 2023-01-10 MED ORDER — OXYCODONE HCL 5 MG PO TABS: 5.0000 mg | ORAL_TABLET | ORAL | 0 refills | Status: DC | PRN

## 2023-01-10 MED ORDER — FENTANYL CITRATE (PF) 100 MCG/2ML IJ SOLN
INTRAMUSCULAR | Status: DC | PRN
  Administered 2023-01-10 (×3): 50 ug via INTRAVENOUS
  Administered 2023-01-10: 100 ug via INTRAVENOUS

## 2023-01-10 MED ORDER — CHLORHEXIDINE GLUCONATE 0.12 % MT SOLN: 15.0000 mL | Freq: Once | OROMUCOSAL | Status: DC

## 2023-01-10 MED ORDER — DEXAMETHASONE SODIUM PHOSPHATE 10 MG/ML IJ SOLN
INTRAMUSCULAR | Status: DC | PRN
  Administered 2023-01-10: 5 mg via INTRAVENOUS

## 2023-01-10 SURGICAL SUPPLY — 38 items
ADH SKN CLS APL DERMABOND .7 (GAUZE/BANDAGES/DRESSINGS) ×2
APL PRP STRL LF DISP 70% ISPRP (MISCELLANEOUS) ×1
APPLIER CLIP 9.375 SM OPEN (CLIP) ×2
APR CLP SM 9.3 20 MLT OPN (CLIP) ×2
BLADE SURG 15 STRL LF DISP TIS (BLADE) ×1 IMPLANT
BLADE SURG 15 STRL SS (BLADE) ×1
CHLORAPREP W/TINT 26 (MISCELLANEOUS) ×1 IMPLANT
CLIP APPLIE 9.375 SM OPEN (CLIP) ×1 IMPLANT
COVER LIGHT HANDLE STERIS (MISCELLANEOUS) ×1 IMPLANT
COVER PROBE W GEL 5X96 (DRAPES) ×1 IMPLANT
DECANTER SPIKE VIAL GLASS SM (MISCELLANEOUS) ×1 IMPLANT
DERMABOND ADVANCED .7 DNX12 (GAUZE/BANDAGES/DRESSINGS) ×1 IMPLANT
DEVICE DUBIN SPECIMEN MAMMOGRA (MISCELLANEOUS) IMPLANT
ELECT REM PT RETURN 9FT ADLT (ELECTROSURGICAL) ×1
ELECTRODE REM PT RTRN 9FT ADLT (ELECTROSURGICAL) ×1 IMPLANT
GAUZE 4X4 16PLY ~~LOC~~+RFID DBL (SPONGE) IMPLANT
GLOVE BIO SURGEON STRL SZ 6.5 (GLOVE) ×1 IMPLANT
GLOVE BIOGEL PI IND STRL 6.5 (GLOVE) ×1 IMPLANT
GLOVE BIOGEL PI IND STRL 7.0 (GLOVE) ×2 IMPLANT
GOWN STRL REUS W/TWL LRG LVL3 (GOWN DISPOSABLE) ×2 IMPLANT
INST SET MINOR GENERAL (KITS) ×1 IMPLANT
KIT MARKER MARGIN INK (KITS) ×1 IMPLANT
KIT TURNOVER KIT A (KITS) ×1 IMPLANT
NDL HYPO 18GX1.5 BLUNT FILL (NEEDLE) ×1 IMPLANT
NDL HYPO 25X1 1.5 SAFETY (NEEDLE) ×2 IMPLANT
NEEDLE HYPO 18GX1.5 BLUNT FILL (NEEDLE) ×1 IMPLANT
NEEDLE HYPO 25X1 1.5 SAFETY (NEEDLE) ×2 IMPLANT
NS IRRIG 1000ML POUR BTL (IV SOLUTION) ×1 IMPLANT
PACK MINOR (CUSTOM PROCEDURE TRAY) ×1 IMPLANT
PAD ARMBOARD 7.5X6 YLW CONV (MISCELLANEOUS) ×2 IMPLANT
PENCIL SMOKE EVACUATOR (MISCELLANEOUS) IMPLANT
SET BASIN LINEN APH (SET/KITS/TRAYS/PACK) ×1 IMPLANT
SUT MNCRL AB 4-0 PS2 18 (SUTURE) ×2 IMPLANT
SUT SILK 2 0 SH (SUTURE) ×1 IMPLANT
SUT VIC AB 3-0 SH 27 (SUTURE) ×2
SUT VIC AB 3-0 SH 27X BRD (SUTURE) ×2 IMPLANT
SYR BULB IRRIG 60ML STRL (SYRINGE) ×1 IMPLANT
SYR CONTROL 10ML LL (SYRINGE) ×2 IMPLANT

## 2023-01-10 NOTE — Transfer of Care (Signed)
Immediate Anesthesia Transfer of Care Note  Patient: Judy Patton [facility]  Procedure(s) Performed: PARTIAL MASTECTOMY WITH AXILLARY SENTINEL LYMPH NODE BIOPSY AND RADIOFREQUENCY TAG PLACEMENT (Left: Breast)  Patient Location: PACU  Anesthesia Type:General  Level of Consciousness: awake and patient cooperative  Airway & Oxygen Therapy: Patient Spontanous Breathing  Post-op Assessment: Report given to RN and Post -op Vital signs reviewed and stable  Post vital signs: Reviewed and stable  Last Vitals:  Vitals Value Taken Time  BP 138/92 01/10/23 0954  Temp 98.4 956  Pulse 96 01/10/23 0954  Resp 10 01/10/23 0954  SpO2 97 % 01/10/23 0954  Vitals shown include unvalidated device data.  Last Pain:  Vitals:   01/10/23 0636  PainSc: 0-No pain      Patients Stated Pain Goal: 5 ([id number])  Complications: No notable events documented.

## 2023-01-10 NOTE — Anesthesia Procedure Notes (Signed)
Procedure Name: Intubation Date/Time: 01/10/2023 7:38 AM  Performed by: Vista Deck, CRNAPre-anesthesia Checklist: Patient identified, Patient being monitored, Timeout performed, Emergency Drugs available and Suction available Patient Re-evaluated:Patient Re-evaluated prior to induction Oxygen Delivery Method: Circle system utilized Preoxygenation: Pre-oxygenation with 100% oxygen Induction Type: IV induction Ventilation: Mask ventilation without difficulty Laryngoscope Size: Mac and 3 Grade View: Grade I Tube type: Oral Tube size: 7.0 mm Number of attempts: 1 Airway Equipment and Method: Stylet Placement Confirmation: ETT inserted through vocal cords under direct vision, positive ETCO2 and breath sounds checked- equal and bilateral Secured at: 22 cm Tube secured with: Tape Dental Injury: Teeth and Oropharynx as per pre-operative assessment

## 2023-01-10 NOTE — Progress Notes (Signed)
[facility]  Updated her husband. Has bene on lovenox for 3  days leading up to surgery. Can restart her Xarelto tomorrow unless she has very extensive bruising in the AM.   Will do note to return to work 3/25 and can see me in clinic 3/28 later in the afternoon.   Curlene Labrum, MD [facility] [address] [place], [place] [address] [phone] (office)

## 2023-01-10 NOTE — Op Note (Signed)
[facility] Operative Note  01/10/23  Preoperative Diagnosis: Left breast cancer    Postoperative Diagnosis: Same   Procedure(s) Performed: Left partial mastectomy after radiofrequency tag, left sentinel lymph node biopsy    Surgeon: Lanell Matar. Constance Haw, MD   Assistants: No qualified resident was available    Anesthesia: General endotracheal   Anesthesiologist: Dr. Briant Cedar, MD    Specimens: Left breast tissue (painted); left axillary lymph nodes (hot and brown with magtrace)    Estimated Blood Loss: Minimal   Blood Replacement: None    Complications: None   Wound Class: Clean   Operative Indications: Ms. Delerme is a 52 yo who had a left breast cancer found on mammogram. We discussed partial mastectomy after radiofrequency tag placement and sentinel lymph node biopsy. Discussed risk of bleeding, infection, needing more surgery or treatment, and lymphedema. Discussed that I will be using magtrace for the sentinel lymph node biopsy.   Findings: Specimen with radiofrequency tag and biopsy clip in specimen   Procedure: The patient was taken to the operating room and placed supine. General endotracheal anesthesia was induced. Intravenous antibiotics were administered per protocol. A JACHO approved timeout was performed.   Magtrace was injected in the subareolar plexus, and massaged for 5 minutes to allow for adequate uptake to the lymph nodes.  The left breast and axilla were prepared and draped in the usual sterile fashion.   Using the Hologic localizer for the radiofrequency tag, I localized the area in the upper outer breast and made a curvilinear incision. Skin flaps were made. The localizer was used to excise the breast tissue around the clip and tag, ensuring at least 1 cm of margins around the area.  The cavity was made hemostatic.   The specimen was sent to mammography and confirmed that the tag and clip were in the specimen.   Attention was then turned to the  axilla. Using the Magtrace probe, the spot in the axilla with the highest counts was incised and I carried the dissection into the axilla and beneath the clavipectoral fascia. I first encountered a node with en vivo counts of 89 that was very large and partial brown and appeared to have a significant amount of fatty replacement.  This node was excised and ex vivo counts were 39.  This node was removed using sharp dissection and clips around the vessels and lymph channels. Follow this the counts were significant higher in a deeper more medial region of the axilla.  En vivo counts of 5771 were noted and a brown hot node was identified and excised in the similar fashion clipping the vessels and lymph channels I identified. The ex vivo counts were 7173.  An additional node was identified still more medial and deep with counts of over 4000s in vivo and an ex vivo count of 4232.  We continued to have high background counts into the 700s and I mostly saw lymph channels in this region and no lymph nodes.  I finally identified a small node that measured 4232 ex vivo, and background counts continued to be in the 400-500s.  A final node was seen by was not as hot or brown and had an ex vivo count of 387.  I concluded the sentinel node dissection after palpating for any further nodes and noting the highest remaining counts being in proximity to clipped lymph node channels.  In the end the background continued to be in the 300-400 range without obvious nodes identified.  In total at least  4 lymph nodes were identified and removed, and I did not feel that it was safe to continue to excise nodes.   The cavity was made hemostatic. Irrigation was performed. The deep space was closed with 3-0 Vicyrl suture and the skin was closed with 4-0 Monocryl subcuticular. I then placed clips in the partial mastectomy site cavity to mark this for radiation, and confirmed hemostasis. The deep space was left open for seroma formation for  cosmesis and the skin flaps were closed with 3-0 Vicryl suture and the skin was close with 4-0 Monocryl subcuticular. Dermabond was applied to both areas .  All counts were correct at the end of the case. The patient was awakened from anesthesia and extubated without complication.  The patient went to the PACU in stable condition.   Curlene Labrum, MD [facility] [address] [place], [place] [address] [phone] (office)

## 2023-01-10 NOTE — Discharge Instructions (Signed)
Discharge instructions after breast surgery:    Restart your Xarelto 01/11/2023 unless you have extensive bruising noted that morning. If you do call the office.    Common Complaints: Pain and bruising at the incision sites.  Swelling at the incision sites. Stiffness of the arm.   Diet/ Activity: Diet as tolerated.  You may shower but do not take hot showers as this can disrupt the glue. Rest and listen to your body, but do not remain in bed all day.  Walk everyday for at least 15-20 minutes. Deep cough and move around every 1-2 hours in the first few days after surgery.  Do not lift > 10 lbs for the first 2 weeks after surgery. Do not do anything that makes you feel like you are putting unnecessary pull or stretch on the incision sites.  Do move your arm and shoulder (see exercises options below). If you do not move then you can get stiff and hurt more.  Do not pick at the dermabond glue on your incision sites.  This glue film will remain in place for 1-2 weeks and will start to peel off.  Do not place lotions or balms on your incision unless instructed to specifically by Dr. Constance Haw.   Pain Expectations and Narcotics: -After surgery you will have pain associated with your incisions and this is normal. The pain is muscular and nerve pain, and will get better with time. -You are encouraged and expected to take non narcotic medications like tylenol and ibuprofen (when able) to treat pain as multiple modalities can aid with pain treatment. -Narcotics are only used when pain is severe or there is breakthrough pain. -You are not expected to have a pain score of 0 after surgery, as we cannot prevent pain. A pain score of 3-4 that allows you to be functional, move, walk, and tolerate some activity is the goal. The pain will continue to improve over the days after surgery and is dependent on your surgery. -Due to [place] law, we are only able to give a certain amount of pain medication to treat post  operative pain, and we only give additional narcotics on a patient by patient basis.  -For most laparoscopic surgery, studies have shown that the majority of patients only need 10-15 narcotic pills, and for open surgeries most patients only need 15-20.   -Having appropriate expectations of pain and knowledge of pain management with non narcotics is important as we do not want anyone to become addicted to narcotic pain medication.  -Using ice packs in the first 48 hours and heating pads after 48 hours, wearing an abdominal binder (when recommended), and using over the counter medications are all ways to help with pain management.   -Simple acts like meditation and mindfulness practices after surgery can also help with pain control and research has proven the benefit of these practices.  Medication: Take tylenol and ibuprofen as needed for pain control, alternating every 4-6 hours.  Example:  Tylenol '1000mg'$  @ 6am, 12noon, 6pm, 11mdnight (Do not exceed '4000mg'$  of tylenol a day). Ibuprofen '800mg'$  @ 9am, 3pm, 9pm, 3am (Do not exceed '3600mg'$  of ibuprofen a day).  Take Roxicodone for breakthrough pain every 4 hours.  Take Colace for constipation related to narcotic pain medication. If you do not have a bowel movement in 2 days, take Miralax over the counter.  Drink plenty of water to also prevent constipation.   Contact Information: If you have questions or concerns, please call our office, [phone] Monday-  Thursday 8AM-5PM and Friday 8AM-12Noon.  If it is after hours or on the weekend, please call [facility], [phone], [phone], and ask to speak to the surgeon on call for Dr. Constance Haw at [facility].   Exercises After Breast Surgery Do at least a few of the exercises below twice a day. It is ok to start the day after surgery and gradually build up the amount and type of exercises you do. Link to the exercises with pictures  ([email or web address]).   Deep Breathing Exercise Deep breathing can help you relax and ease discomfort and tightness around your incision (surgical cut). It's also a very good way to relieve stress during the day.  Sit comfortably in a chair. Take a slow, deep breath through your nose. Let your chest and belly expand. Breathe out slowly through your mouth. Repeat as many times as needed.  Arm and Shoulder Exercises Doing arm and shoulder exercises will help you get back your full range of motion on your affected side (the side where you had your surgery). With full range of motion, you'll be able to: Move your arm over your head and out to the side Move your arm behind your neck Move your arm to the middle of your back Do each of the exercises below 5 times a day. Keep doing this until you have a full range of motion again and can use your arm as you did before surgery in all your normal activities. This includes activities at work, at home, and in recreation or sports. If you had limited movement in your arm before surgery, your goal will be to get back as much movement as you had before.  If you get your full range of motion back quickly, keep doing these exercises once a day instead of 5 times a day. This is especially true if you feel any tightness in your chest, shoulder, or under your affected arm. These exercises can help keep scar tissue from forming in your armpit and shoulder. Scar tissue can limit your arm movements later.  If you still have trouble moving your shoulder 4 weeks after your surgery, tell your surgeon. They'll tell you if you need more rehabilitation, such as physical or occupational therapy.  If you had one of the following surgeries, you can do the following set of exercises on the first day after your surgery, as long as your surgeon tells you it's safe.  Shoulder rolls The shoulder roll is a good  exercise to start with because it gently stretches your chest and shoulder muscles.  Stand or sit comfortably with your arms relaxed at your sides. Start with backward shoulder rolls. In a circular motion, bring your shoulders forward, up, backward, and down. Do this 10 times. Switch directions and do 10 forward shoulder rolls. Bring your shoulders backward, up, forward, and down. Do this 10 times. Try to make the circles as big as you can and move both shoulders at the same time. If you have some tightness across your incision or chest, start with smaller circles and make them bigger as the tightness decreases. The backward direction might feel a little tighter across your chest than the forward direction. This will get better with practice.  Shoulder wings The shoulder wings exercise will help you get back outward movement of your shoulder. You can do this exercise while sitting or standing.  Place your hands on your chest or collarbone. Raise your elbows out to the side, limiting your range of motion  as instructed by your healthcare team. Slowly lower your elbows. Do this 10 times. Then, slowly lower your hands. If you feel discomfort while doing this exercise, hold your position and do the deep breathing exercise. If the discomfort passes, raise your elbows a little higher. If it doesn't pass, don't raise your elbows any higher. Finish the exercise raising your elbows only high enough to feel a gentle stretch and no discomfort.  Arm circles If you had surgery on both breasts, do this exercise with both arms, 1 arm at a time. Don't do this exercise with both arms at the same time. This will put too much pressure on your chest.  Stand with your feet slightly apart for balance. Raise your affected arm out to the side as high as you can, limiting your range of movement as instructed by your healthcare team. Start making slow, backward circles in the air with your arm. Make sure you're moving your  arm from your shoulder, not your elbow. Keep your elbow straight. Increase the size of the circles until they're as big as you can comfortably make them, limiting your range of motion as instructed by your healthcare team. If you feel any aching or if your arm is tired, take a break. Keep doing the exercise when you feel better. Do 10 full backward circles. Then, slowly lower your arm to your side. Rest your arm for a moment. Follow steps 1 to 4 again, but this time make slow, forward circles.  W exercise You can do the W exercise while sitting or standing.  Form a "W" with your arms out to the side and palms facing forward (see Figure 4). Try to bring your hands up so they're even with your face. If you can't raise your arms that high, bring them to the highest comfortable position. Make sure to limit your range of motion as instructed by your healthcare team. Pinch your shoulder blades together and downward, as if you're squeezing a pencil between them. If you feel discomfort, stop at that position and do the deep breathing exercise. If the discomfort passes, try to bring your arms back a little further. If it doesn't pass, don't reach any further. Hold the furthest position that doesn't cause discomfort. Squeeze your shoulder blades together and downward for 5 seconds. Slowly bring your arms back down to the starting position. Repeat this movement 10 times.  Back Climb You can do the back climb stretch while sitting or standing. You'll need a timer or stopwatch.  Place your hands behind your back. Hold the hand on your affected side with your other hand. If you had surgery on both breasts, use the arm that moves most easily to hold the other. Slowly slide your hands up the center of your back as far as you can. If you feel tightness near your incision, stop at that position and do the deep breathing exercise. If the tightness decreases, try to slide your hands up a little further. If it  doesn't decrease, don't slide your hands up any further. Hold the highest position you can for 1 minute. Use your stopwatch or timer to keep track. You should feel a gentle stretch in your shoulder area. After 1 minute, slowly lower your hands.  Hands behind neck You can do the hands behind neck stretch while sitting or standing. You'll need a timer or stopwatch.  Clasp your hands together on your lap or in front of you. Slowly raise your hands toward your head,  keeping your elbows together in front of you, not out to the sides. Keep your head level. Don't bend your neck or head forward. Slide your hands over your head until you reach the back of your neck. When you get to this point, spread your elbows out to the sides. Hold this position for 1 minute. Use your stopwatch or timer to keep track. Breathe normally. Don't hold your breath as you stretch your body. If you have some tightness across your incision or chest, hold your position and do the deep breathing exercise. If the tightness decreases, continue with the movement. If the tightness stays the same, reach up and stretch your elbows back as best as you can without causing discomfort. Hold the position you're most comfortable in for 1 minute. Slowly come out of the stretch by bringing your elbows together and sliding your hands over your head. Then, slowly lower your arms.  Forward wall crawls You'll need 2 pieces of tape for the forward wall crawl exercise.  Stand facing a wall. Your toes should be about 6 inches (15 centimeters) from the wall. Reach as high as you can with your unaffected arm. [place] that point with a piece of tape. This will be the goal for your affected arm. If you had surgery on both breasts, set your goal using the arm that moves most comfortably. Place both hands against the wall at a level that's comfortable. Crawl your fingers up the wall as far as you can, keeping them even with each other.. Try not to look up  toward your hands or arch your back. When you get to the point where you feel a good stretch, but not pain, do the deep breathing exercise. Return to the starting position by crawling your fingers back down the wall. Repeat the wall crawl 10 times. Each time you raise your hands, try to crawl a little bit higher. On the 10th crawl, use the other piece of tape to mark the highest point you reached with your affected arm. This will let you to see your progress each time you do this exercise. As you become more flexible, you may need to take a step closer to the wall so you can reach a little higher.   Side wall crawls You'll also need 2 pieces of tape for the side wall crawl exercise.  You shouldn't feel pain while doing this exercise. It's normal to feel some tightness or pulling across the side of your chest. Focus on your breathing until the tightness decreases. Breathe normally throughout this exercise. Don't hold your breath.  Be careful not to turn your body toward the wall while doing this exercise. Make sure only the side of your body faces the wall.  If you had surgery on both breasts, start with step 3.  Stand with your unaffected side closest to the wall, about 1 foot (30.5 centimeters) away from the wall. Reach as high as you can with your unaffected arm. [place] that point with a piece of tape (see Figure 8). This will be the goal for your affected arm. Turn your body so your affected side is now closest to the wall. If you had surgery on both breasts, start with either side closest to the wall. Crawl your fingers up the wall as far as you can. When you get to the point where you feel a good stretch, but not pain, do the deep breathing exercise. Return to the starting position by crawling your fingers back down  the wall. Repeat this exercise 10 times. On your 10th crawl, use a piece of tape to mark the highest point you reached with your affected arm. This will let you see your progress  each time you do the exercise. If you had surgery on both breasts, repeat the exercise with your other arm.  Swelling After your surgery, you may have some swelling or puffiness in your hand or arm on your affected side. This is normal and usually goes away on its own.  If you notice swelling in your hand or arm, follow the tips below to help the swelling go away.  Raise your arm above your head and do hand pumps several times a day. To do hand pumps, slowly open and close your fist 10 times. This will help drain the fluid out of your arm. Don't hold your arm straight up over your head for more than a few minutes. This can cause your arm muscles to get tired. Raise your arm to the side a few times a day for about 20 minutes at a time. To do this, sit or lie down on your back. Rest your arm on a few pillows next to you so it's raised above the level of your heart. If you're able to sleep on your unaffected side, you can place 1 or 2 pillows in front of you and rest your affected arm on them while you sleep. If the swelling doesn't go down within 4 to 6 weeks, call your surgeon or nurse.

## 2023-01-10 NOTE — Interval H&P Note (Signed)
History and Physical Interval Note:  01/10/2023 7:18 AM  Judy Patton [facility]  has presented today for surgery, with the diagnosis of LEFT BREAST CANCER, ER POSITIVE.  The various methods of treatment have been discussed with the patient and family. After consideration of risks, benefits and other options for treatment, the patient has consented to  Procedure(s): PARTIAL MASTECTOMY WITH AXILLARY SENTINEL LYMPH NODE BIOPSY AND RADIOFREQUENCY TAG PLACEMENT (Left) as a surgical intervention.  The patient's history has been reviewed, patient examined, no change in status, stable for surgery.  I have reviewed the patient's chart and labs.  Questions were answered to the patient's satisfaction.     Virl Cagey

## 2023-01-10 NOTE — Anesthesia Preprocedure Evaluation (Signed)
Anesthesia Evaluation  Patient identified by MRN, date of birth, ID band Patient awake    Reviewed: Allergy & Precautions, H&P , NPO status , Patient's Chart, lab work & pertinent test results, reviewed documented beta blocker date and time   History of Anesthesia Complications (+) PONV and history of anesthetic complications  Airway Mallampati: II  TM Distance: >3 FB Neck ROM: full    Dental no notable dental hx.    Pulmonary neg pulmonary ROS   Pulmonary exam normal breath sounds clear to auscultation       Cardiovascular Exercise Tolerance: Good negative cardio ROS  Rhythm:regular Rate:Normal     Neuro/Psych  Headaches PSYCHIATRIC DISORDERS Anxiety Depression    negative neurological ROS  negative psych ROS   GI/Hepatic negative GI ROS, Neg liver ROS,,,  Endo/Other  negative endocrine ROSHypothyroidism    Renal/GU negative Renal ROS  negative genitourinary   Musculoskeletal   Abdominal   Peds  Hematology negative hematology ROS (+)   Anesthesia Other Findings   Reproductive/Obstetrics negative OB ROS                             Anesthesia Physical Anesthesia Plan  ASA: 2  Anesthesia Plan: General   Post-op Pain Management:    Induction:   PONV Risk Score and Plan: Ondansetron  Airway Management Planned:   Additional Equipment:   Intra-op Plan:   Post-operative Plan:   Informed Consent: I have reviewed the patients History and Physical, chart, labs and discussed the procedure including the risks, benefits and alternatives for the proposed anesthesia with the patient or authorized representative who has indicated his/her understanding and acceptance.     Dental Advisory Given  Plan Discussed with: CRNA  Anesthesia Plan Comments:        Anesthesia Quick Evaluation

## 2023-01-10 NOTE — Anesthesia Postprocedure Evaluation (Signed)
Anesthesia Post Note  Patient: Judy Patton [facility]  Procedure(s) Performed: PARTIAL MASTECTOMY WITH AXILLARY SENTINEL LYMPH NODE BIOPSY AND RADIOFREQUENCY TAG PLACEMENT (Left: Breast)  Patient location during evaluation: Phase II Anesthesia Type: General Level of consciousness: awake Pain management: pain level controlled Vital Signs Assessment: post-procedure vital signs reviewed and stable Respiratory status: spontaneous breathing and respiratory function stable Cardiovascular status: blood pressure returned to baseline and stable Postop Assessment: no headache and no apparent nausea or vomiting Anesthetic complications: no Comments: Late entry   No notable events documented.   Last Vitals:  Vitals:   01/10/23 1045 01/10/23 1047  BP:  132/81  Pulse: 84 75  Resp: 13 10  Temp:  36.6 C  SpO2: 93% 97%    Last Pain:  Vitals:   01/10/23 1053  PainSc: [place]

## 2023-01-12 LAB — SURGICAL PATHOLOGY

## 2023-01-13 ENCOUNTER — Telehealth: Payer: BC Managed Care – PPO | Admitting: Licensed Clinical Social Worker

## 2023-01-13 NOTE — Progress Notes (Signed)
Updated patient on pathology and discussed the cancer and having 2 lymph nodes positive.

## 2023-01-19 ENCOUNTER — Encounter (HOSPITAL_COMMUNITY): Payer: Self-pay | Admitting: General Surgery

## 2023-01-22 ENCOUNTER — Emergency Department (HOSPITAL_COMMUNITY)
Admission: EM | Admit: 2023-01-22 | Discharge: 2023-01-22 | Disposition: A | Discharge status: Home / Self Care | Payer: BC Managed Care – PPO | Attending: Emergency Medicine | Admitting: Emergency Medicine

## 2023-01-22 ENCOUNTER — Encounter (HOSPITAL_COMMUNITY): Payer: Self-pay | Admitting: Emergency Medicine

## 2023-01-22 ENCOUNTER — Other Ambulatory Visit: Payer: Self-pay

## 2023-01-22 DIAGNOSIS — N6489 Other specified disorders of breast: Secondary | ICD-10-CM | POA: Diagnosis not present

## 2023-01-22 DIAGNOSIS — Z7901 Long term (current) use of anticoagulants: Secondary | ICD-10-CM | POA: Diagnosis not present

## 2023-01-22 DIAGNOSIS — N644 Mastodynia: Secondary | ICD-10-CM | POA: Diagnosis present

## 2023-01-22 MED ORDER — DOXYCYCLINE HYCLATE 100 MG PO CAPS: 100.0000 mg | ORAL_CAPSULE | Freq: Two times a day (BID) | ORAL | 0 refills | Status: DC

## 2023-01-22 MED ORDER — DOXYCYCLINE HYCLATE 100 MG PO TABS
100.0000 mg | ORAL_TABLET | Freq: Once | ORAL | Status: AC
  Administered 2023-01-22: 100 mg via ORAL
  Filled 2023-01-22: qty 1

## 2023-01-22 NOTE — ED Triage Notes (Addendum)
C/o of left breast pain r/t breast biopsy with partial mastectomy on 3/11. Pt states it is swollen and starting to drain yellowish clear discharge

## 2023-01-22 NOTE — ED Provider Notes (Signed)
[facility] Provider Note   CSN: [id number] Arrival date & time: 01/22/23  0141     History  Chief Complaint  Patient presents with   Breast Pain    Judy Patton is a 52 y.o. female.  Patient presents to the emergency department because she has noticed some pain and drainage from her left breast.  Patient had lumpectomy and lymph node dissection on March 11.  She had been doing well postoperatively until today when she started noticing yellow fluid draining out of the incision on her breast.  No associated fever.       Home Medications Prior to Admission medications   Medication Sig Start Date End Date Taking? Authorizing Provider  doxycycline (VIBRAMYCIN) 100 MG capsule Take 1 capsule (100 mg total) by mouth 2 (two) times daily. 01/22/23  Yes Tailyn Hantz, Gwenyth Allegra, MD  acetaminophen (TYLENOL) 500 MG tablet Take 1,000 mg by mouth every 6 (six) hours as needed for moderate pain or headache.    [provider]  enoxaparin (LOVENOX) 40 MG/0.4ML injection Inject 0.4 mLs (40 mg total) into the skin daily for 3 days. Take on 3/8, 3/9, 3/10 leading up to surgery while you are holding your xarelto 12/28/22 01/10/23  Virl Cagey, MD  ibuprofen (ADVIL,MOTRIN) 200 MG tablet Take 400 mg by mouth every 6 (six) hours as needed for headache or moderate pain.    [provider]  levothyroxine (SYNTHROID, LEVOTHROID) 88 MCG tablet TAKE 88 MCG BY MOUTH IN THE MORNING 11/27/18   Derrek Monaco A, NP  loratadine (CLARITIN) 10 MG tablet Take 10 mg by mouth daily.    [provider]  ondansetron (ZOFRAN) 4 MG tablet Take 1 tablet (4 mg total) by mouth every 8 (eight) hours as needed. 01/10/23 01/10/24  Virl Cagey, MD  oxyCODONE (ROXICODONE) 5 MG immediate release tablet Take 1 tablet (5 mg total) by mouth every 4 (four) hours as needed for severe pain or breakthrough pain. 01/10/23 01/10/24  Virl Cagey, MD   Polyethylene Glycol 400 (VISINE DRY EYE RELIEF OP) Place 1 drop into both eyes daily as needed (dry eye).    [provider]  rOPINIRole (REQUIP) 2 MG tablet TAKE ONE TABLET BY MOUTH AT BEDTIME 02/20/16   Derrek Monaco A, NP  TRINTELLIX 20 MG TABS tablet Take 20 mg by mouth daily.    [provider]  trolamine salicylate (ASPERCREME) 10 % cream Apply 1 application topically as needed for muscle pain.    [provider]  XARELTO 20 MG TABS tablet Take 20 mg by mouth in the morning 09/03/17   [provider]      Allergies    Patient has no known allergies.    Review of Systems   Review of Systems  Physical Exam Updated Vital Signs BP (!) 111/96   Pulse (!) 106   Temp 98.3 F (36.8 C) (Oral)   Resp 16   Ht 5\' 5"  (1.651 m)   Wt 85.7 kg   SpO2 99%   BMI 31.45 kg/m  Physical Exam Vitals and nursing note reviewed.  Constitutional:      General: She is not in acute distress.    Appearance: She is well-developed.  HENT:     Head: Normocephalic and atraumatic.     Mouth/Throat:     Mouth: Mucous membranes are moist.  Eyes:     General: Vision grossly intact. Gaze aligned appropriately.  Extraocular Movements: Extraocular movements intact.     Conjunctiva/sclera: Conjunctivae normal.  Cardiovascular:     Rate and Rhythm: Normal rate and regular rhythm.     Pulses: Normal pulses.     Heart sounds: Normal heart sounds, S1 normal and S2 normal. No murmur heard.    No friction rub. No gallop.  Pulmonary:     Effort: Pulmonary effort is normal. No respiratory distress.     Breath sounds: Normal breath sounds.  Abdominal:     General: Bowel sounds are normal.     Palpations: Abdomen is soft.     Tenderness: There is no abdominal tenderness. There is no guarding or rebound.     Hernia: No hernia is present.  Musculoskeletal:        General: No swelling.     Cervical back: Full passive range of motion without pain, normal range of motion  and neck supple. No spinous process tenderness or muscular tenderness. Normal range of motion.     Right lower leg: No edema.     Left lower leg: No edema.  Skin:    General: Skin is warm and dry.     Capillary Refill: Capillary refill takes less than 2 seconds.     Findings: No ecchymosis, erythema, rash or wound.     Comments: Surgical incision outer portion of left breast is intact except for the most lateral aspect which has a tiny dehiscence with yellow serous fluid draining.  No associated induration.  No significant erythema.  Neurological:     General: No focal deficit present.     Mental Status: She is alert and oriented to person, place, and time.     GCS: GCS eye subscore is 4. GCS verbal subscore is 5. GCS motor subscore is 6.     Cranial Nerves: Cranial nerves 2-12 are intact.     Sensory: Sensation is intact.     Motor: Motor function is intact.     Coordination: Coordination is intact.  Psychiatric:        Attention and Perception: Attention normal.        Mood and Affect: Mood normal.        Speech: Speech normal.        Behavior: Behavior normal.     ED Results / Procedures / Treatments   Labs (all labs ordered are listed, but only abnormal results are displayed) Labs Reviewed - No data to display  EKG None  Radiology No results found.  Procedures Procedures    Medications Ordered in ED Medications  doxycycline (VIBRA-TABS) tablet 100 mg (has no administration in time range)    ED Course/ Medical Decision Making/ A&P                             Medical Decision Making Risk Prescription drug management.   Examination is most consistent with spontaneously draining seroma after surgery.  I do not see any purulent drainage at this time.  I am able to express very crystal-clear yellow fluid from the incision.  Will empirically cover with doxycycline.  She was given return instructions.  She will follow-up with Dr. Constance Haw in the office this week  otherwise.        Final Clinical Impression(s) / ED Diagnoses Final diagnoses:  Seroma of breast    Rx / DC Orders ED Discharge Orders          Ordered    doxycycline (  VIBRAMYCIN) 100 MG capsule  2 times daily        01/22/23 0224              Orpah Greek, MD 01/22/23 [phone]

## 2023-01-22 NOTE — Discharge Instructions (Signed)
Fluid that is draining out of your incision tonight looks like normal inflammatory fluid that we see sometimes after surgery.  I do not see discrete signs of infection but will give you antibiotics to prevent this fluid from getting infected.  Follow-up with Dr. Constance Haw Thursday as scheduled.  If you have any concerns before that, call her sooner.  If you have fever, increased redness or change in the drainage over this weekend, return to this emergency department.

## 2023-01-25 ENCOUNTER — Ambulatory Visit (INDEPENDENT_AMBULATORY_CARE_PROVIDER_SITE_OTHER): Payer: BC Managed Care – PPO | Admitting: General Surgery

## 2023-01-25 ENCOUNTER — Encounter: Payer: Self-pay | Admitting: *Deleted

## 2023-01-25 ENCOUNTER — Other Ambulatory Visit: Payer: Self-pay

## 2023-01-25 ENCOUNTER — Encounter: Payer: Self-pay | Admitting: General Surgery

## 2023-01-25 VITALS — BP 146/78 | HR 92 | Temp 98.1°F | Resp 16 | Ht 65.0 in | Wt 194.0 lb

## 2023-01-25 DIAGNOSIS — C50412 Malignant neoplasm of upper-outer quadrant of left female breast: Secondary | ICD-10-CM

## 2023-01-25 NOTE — [place] Notes (Signed)
[facility]  Patient unfortunately had to go to the ED Friday night due to some swelling and d drainage from her left breast incision. They diagnosed seroma and gave her prophylactic doxycyline. She says the drainage has continued some but is decreasing. The swellings is improved and no redness noted now but she felt like it was getting red when she went to the ED.  BP (!) 146/78   Pulse 92   Temp 98.1 F (36.7 C) (Oral)   Resp 16   Ht 5\' 5"  (1.651 m)   Wt 194 lb (88 kg)   SpO2 98%   BMI 32.28 kg/m  Incision breast left lateral with lateral opening and some minor serous drainage, no erythema on skin, axillary incision with seroma noted and incision intact, no erythema or drainage  Patient s/p partial mastectomy and SLNB on the left for breast cancer. She has positive nodes on pathology and I have already notified her of this by phone.   The area is draining less and will need to just monitor this.  Keep area covered with pad and place neosporin on the area daily to twice daily. Ok to shower and pat dry. Replace neosporin at that time. Ok to return to work 01/26/23. I wrote for restrictions of no lifting over 20 lbs for next 2 weeks to be safe but still move and work, lift with your right arm, etc.   Future Appointments  Date Time Provider [place]  02/03/2023  8:00 AM Derek Jack, MD [place] None  02/09/2023  2:00 PM Virl Cagey, MD [facility] None  02/10/2023  1:45 PM [place] None   Curlene Labrum, MD [facility] [address] [place], [place] [address] [phone] (office)

## 2023-01-25 NOTE — Patient Instructions (Signed)
Keep area covered with pad and place neosporin on the area daily to twice daily. Ok to shower and pat dry. Replace neosporin at that time. Ok to return to work 01/26/23. I wrote for restrictions of no lifting over 20 lbs for next 2 weeks to be safe but still move and work, lift with your right arm, etc.

## 2023-01-27 ENCOUNTER — Encounter: Payer: BC Managed Care – PPO | Admitting: General Surgery

## 2023-02-02 NOTE — [place] Notes (Incomplete)
[facility] [address], [place] [address]    Clinic Day:  02/03/2023  Referring physician: Candi Leash, PA-C  Patient Care Team: Candi Leash, PA-C as PCP - General (Physician Assistant) Derek Jack, MD as Medical Oncologist (Medical Oncology)   ASSESSMENT & PLAN:   Assessment: 1.  Stage I (T1c N1 M0, G2, ER/PR+, HER2-) left breast UOQ IDC: - Screening mammogram (12/01/2022): Possible distortion in the left breast.  Right breast has no findings suspicious for malignancy. - Left breast diagnostic mammogram/US (12/14/2022): Irregular hypoechoic mass at the 1 o'clock position of the left breast, 4 cm from nipple, measuring 1 x 0.7 x 0.9 cm.  No pathologic lymphadenopathy in the left axilla. - Left breast biopsy (12/16/2022): Invasive ductal carcinoma, grade 2, ER/PR 100%, Ki-67 1%, HER2 0 by IHC. - Left breast lumpectomy and SLNB on 01/10/2023. - Pathology: 1.4 x 1.1 x 1.1 cm moderately differentiated IDC, grade 2, margins negative.  1 sentinel lymph node with metastatic carcinoma (1.1 mm).  1 lymph node with micrometastasis (1 mm).  3 other sentinel lymph nodes negative for invasive carcinoma.  PT1 cpN1a. - Oncotype DX recurrence score 14.   2.  Social/family history: - She lives at home with her husband.  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  No prior history of smoking. - Paternal grandmother had breast cancer.  Brother had kidney cancer.  Father had prostate cancer.   3.  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 - Heterozygosity for factor V Leiden mutation.  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  PT mutation negative.  Protein C, protein S, AT III normal. - She has been on Xarelto since then.   Plan: 1.  Stage IB (T1c N1 M0, G2, ER/PR+, HER2-) left breast UOQ IDC: - We have reviewed the pathology report in  detail. - She has some discharge at the left lumpectomy site and has taken doxycycline which she finished 2 days ago.  She still has some discharge. - We reviewed Oncotype DX results which showed recurrence score 14. - We have also reviewed data from Rx ponder trial which showed improvement in invasive DFS favoring chemotherapy compared to endocrine therapy only.  Since there is no clear-cut data on utilizing Oncotype DX in the premenopausal setting and 1-3 lymph node positive disease, I have recommended adjuvant chemotherapy. - I have recommended anthracycline based chemotherapy with 4 cycles of dose dense AC followed by weekly paclitaxel for 12 weeks. - She will have port placed by Dr. Constance Haw. - I have recommended CT CAP for staging. - We discussed side effects of chemotherapy regimen in detail. - We will also obtain a 2D echocardiogram.  I will see her back on day 1 of chemotherapy.  2.  Bone health: - We reviewed DEXA scan (01/04/2023) results showing T-score -0.3, normal.  Orders Placed This Encounter  Procedures   CT CHEST ABDOMEN PELVIS W CONTRAST    Standing Status:   Future    Standing Expiration Date:   02/03/2024    Order Specific Question:   Preferred imaging location?    Answer:   [facility]    Order Specific Question:   Release to patient    Answer:   Immediate    Order Specific Question:   Radiology Contrast Protocol - do NOT remove file path    Answer:   [email or web address]  Order Specific Question:   Is patient pregnant?    Answer:   No   CBC with Differential    Standing Status:   Future    Number of Occurrences:   1    Standing Expiration Date:   02/03/2024   Comprehensive metabolic panel    Standing Status:   Future    Number of Occurrences:   1    Standing Expiration Date:   02/03/2024   Magnesium    Standing Status:   Future    Number of Occurrences:   1    Standing Expiration Date:   02/03/2024   ECHOCARDIOGRAM  COMPLETE    Standing Status:   Future    Standing Expiration Date:   02/03/2024    Order Specific Question:   Where should this test be performed    Answer:   [facility]    Order Specific Question:   Perflutren DEFINITY (image enhancing agent) should be administered unless hypersensitivity or allergy exist    Answer:   Administer Perflutren    Order Specific Question:   Is a special reader required? (athlete or structural heart)    Answer:   No    Order Specific Question:   Does this study need to be read by the Structural team/Level 3 readers?    Answer:   No    Order Specific Question:   Reason for exam-Echo    Answer:   Chemo  Z09    Order Specific Question:   Release to patient    Answer:   Immediate      I,Katie Daubenspeck,acting as a scribe for Derek Jack, MD.,have documented all relevant documentation on the behalf of Derek Jack, MD,as directed by  Derek Jack, MD while in the presence of Derek Jack, MD.   I, Derek Jack MD, have reviewed the above documentation for accuracy and completeness, and I agree with the above.   Derek Jack, MD   4/4/20246:34 PM  CHIEF COMPLAINT:   Diagnosis: left breast cancer   Cancer Staging  Breast cancer of upper-outer quadrant of left female breast Staging form: Breast, AJCC 8th Edition - Clinical stage from 12/23/2022: Stage IB (cT1c, cN1(sn), cM0, G2, ER+, PR+, HER2-) - Unsigned    Prior Therapy: Lumpectomy and SLNB  Current Therapy: Dose dense AC followed by paclitaxel   HISTORY OF PRESENT ILLNESS:   Oncology History   No history exists.     INTERVAL HISTORY:   Judy Patton is a 52 y.o. female presenting to clinic today for follow up of left breast cancer. She was last seen by me on 12/23/22 in consultation.  Since her last visit, she proceeded to left lumpectomy on 01/10/23 under Dr. Constance Haw. Pathology showed: 1.4 cm invasive ductal adenocarcinoma, grade 2; focal DCIS, intermediate  grade; margins free. Out of 5 lymph nodes, one showed metastatic carcinoma and one with micrometastasis.  Of note, she was seen in the ED on 01/22/23 for left breast swelling and some drainage from the incision. She was given prophylactic doxycycline in the ED and seen by Dr. Constance Haw for follow up on 01/25/23.  She also underwent DEXA scan on 01/04/23 showing a T-score of -0.3, which is considered normal.  Today, she states that she is doing well overall. Her appetite level is at 100%. Her energy level is at 100%.  PAST MEDICAL HISTORY:   Past Medical History: Past Medical History:  Diagnosis Date   Anxiety    Chronic headaches    Depression  Factor V Leiden mutation ([place])    Fatigue 02/18/2015   History of kidney stones    Hypothyroidism    MRSA (methicillin resistant Staphylococcus aureus)    Pain of tooth socket 03/14/2015   Pancreatitis    PONV (postoperative nausea and vomiting)    Pulmonary embolus (HCC)    Restless leg syndrome    Shingles rash 03/14/2015   Shoulder injury    Stress    family   Weight gain 02/18/2015    Surgical History: Past Surgical History:  Procedure Laterality Date   BREAST BIOPSY Left 12/16/2022   [place] LT BREAST BX W LOC DEV 1ST LESION IMG BX SPEC US GUIDE 12/16/2022 [facility]   BREAST BIOPSY Left 01/04/2023   [place] LT RADIO FREQUENCY TAG LOC US GUIDE 01/04/2023 Everlean Alstrom, MD [facility]   COLONOSCOPY  03/25/2004   [id number] canal hemorrhoids, otherwise normal rectum,   DILITATION & CURRETTAGE/HYSTROSCOPY WITH NOVASURE ABLATION N/A 11/08/2017   Procedure: DILATATION & CURETTAGE/HYSTEROSCOPY WITH NOVASURE ENDOMETRIAL ABLATION;  Surgeon: Jonnie Kind, MD;  Location: [facility];  Service: Gynecology;  Laterality: N/A;   ESOPHAGOGASTRODUODENOSCOPY  03/25/2004   [id number] esophagus, small hiatal hernia, otherwise normal stomach   kidney stones  05/2014,11/2014   LAPAROSCOPIC BILATERAL SALPINGECTOMY Bilateral 11/08/2017   Procedure: LAPAROSCOPIC  BILATERAL SALPINGECTOMY;  Surgeon: Jonnie Kind, MD;  Location: [facility];  Service: Gynecology;  Laterality: Bilateral;   LITHOTRIPSY  2008   PARTIAL MASTECTOMY WITH AXILLARY SENTINEL LYMPH NODE BIOPSY Left 01/10/2023   Procedure: PARTIAL MASTECTOMY WITH AXILLARY SENTINEL LYMPH NODE BIOPSY AND RADIOFREQUENCY TAG PLACEMENT;  Surgeon: Virl Cagey, MD;  Location: [facility];  Service: General;  Laterality: Left;    Social History: Social History   Socioeconomic History   Marital status: Married    Spouse name: Not on file   Number of children: Not on file   Years of education: Not on file   Highest education level: Not on file  Occupational History   Not on file  Tobacco Use   Smoking status: Never   Smokeless tobacco: Never  Vaping Use   Vaping Use: Never used  Substance and Sexual Activity   Alcohol use: Yes    Comment: occ   Drug use: No   Sexual activity: Not Currently    Birth control/protection: Surgical  Other Topics Concern   Not on file  Social History Narrative   Not on file   Social Determinants of Health   Financial Resource Strain: Not on file  Food Insecurity: No Food Insecurity (12/23/2022)   Hunger Vital Sign    Worried About Running Out of Food in the Last Year: Never true    Ran Out of Food in the Last Year: Never true  Transportation Needs: No Transportation Needs (12/23/2022)   PRAPARE - Transportation    Lack of Transportation (Medical): No    Lack of Transportation (Non-Medical): No  Physical Activity: Not on file  Stress: Not on file  Social Connections: Not on file  Intimate Partner Violence: Not At Risk (12/23/2022)   Humiliation, Afraid, Rape, and Kick questionnaire    Fear of Current or Ex-Partner: No    Emotionally Abused: No    Physically Abused: No    Sexually Abused: No    Family History: Family History  Problem Relation Age of Onset   Cancer Brother        kidney   Factor V Leiden deficiency Brother    Fibromyalgia Brother     Cancer Paternal  Grandmother        breast cancer   Cancer Paternal Grandfather        lung cancer   Stroke Mother    CAD Mother    Cancer Father        prostate    Current Medications:  Current Outpatient Medications:    acetaminophen (TYLENOL) 500 MG tablet, Take 1,000 mg by mouth every 6 (six) hours as needed for moderate pain or headache., Disp: , Rfl:    ibuprofen (ADVIL,MOTRIN) 200 MG tablet, Take 400 mg by mouth every 6 (six) hours as needed for headache or moderate pain., Disp: , Rfl:    levothyroxine (SYNTHROID, LEVOTHROID) 88 MCG tablet, TAKE 88 MCG BY MOUTH IN THE MORNING, Disp: 90 tablet, Rfl: 3   loratadine (CLARITIN) 10 MG tablet, Take 10 mg by mouth daily., Disp: , Rfl:    ondansetron (ZOFRAN) 4 MG tablet, Take 1 tablet (4 mg total) by mouth every 8 (eight) hours as needed., Disp: 30 tablet, Rfl: 1   oxyCODONE (ROXICODONE) 5 MG immediate release tablet, Take 1 tablet (5 mg total) by mouth every 4 (four) hours as needed for severe pain or breakthrough pain., Disp: 10 tablet, Rfl: 0   Polyethylene Glycol 400 (VISINE DRY EYE RELIEF OP), Place 1 drop into both eyes daily as needed (dry eye)., Disp: , Rfl:    rOPINIRole (REQUIP) 2 MG tablet, TAKE ONE TABLET BY MOUTH AT BEDTIME, Disp: 30 tablet, Rfl: 9   TRINTELLIX 20 MG TABS tablet, Take 20 mg by mouth daily., Disp: , Rfl:    trolamine salicylate (ASPERCREME) 10 % cream, Apply 1 application topically as needed for muscle pain., Disp: , Rfl:    XARELTO 20 MG TABS tablet, Take 20 mg by mouth in the morning, Disp: , Rfl: 2   Allergies: No Known Allergies  REVIEW OF SYSTEMS:   Review of Systems  Constitutional:  Negative for chills, fatigue and fever.  HENT:   Negative for lump/mass, mouth sores, nosebleeds, sore throat and trouble swallowing.   Eyes:  Negative for eye problems.  Respiratory:  Negative for cough and shortness of breath.   Cardiovascular:  Negative for chest pain, leg swelling and palpitations.   Gastrointestinal:  Negative for abdominal pain, constipation, diarrhea, nausea and vomiting.  Genitourinary:  Negative for bladder incontinence, difficulty urinating, dysuria, frequency, hematuria and nocturia.   Musculoskeletal:  Negative for arthralgias, back pain, flank pain, myalgias and neck pain.  Skin:  Negative for itching and rash.  Neurological:  Negative for dizziness, headaches and numbness.  Hematological:  Does not bruise/bleed easily.  Psychiatric/Behavioral:  Negative for depression, sleep disturbance and suicidal ideas. The patient is nervous/anxious.   All other systems reviewed and are negative.    VITALS:   Blood pressure 124/81, pulse 69, temperature 98.4 F (36.9 C), temperature source Oral, resp. rate 17, height 5\' 5"  (1.651 m), weight 194 lb 3.2 oz (88.1 kg), SpO2 100 %.  Wt Readings from Last 3 Encounters:  02/03/23 194 lb 3.2 oz (88.1 kg)  01/25/23 194 lb (88 kg)  01/22/23 189 lb (85.7 kg)    Body mass index is 32.32 kg/m.  Performance status (ECOG): 0 - Asymptomatic  PHYSICAL EXAM:   Physical Exam Vitals and nursing note reviewed. Exam conducted with a chaperone present.  Constitutional:      Appearance: Normal appearance.  Cardiovascular:     Rate and Rhythm: Normal rate and regular rhythm.     Pulses: Normal pulses.  Heart sounds: Normal heart sounds.  Pulmonary:     Effort: Pulmonary effort is normal.     Breath sounds: Normal breath sounds.  Abdominal:     Palpations: Abdomen is soft. There is no hepatomegaly, splenomegaly or mass.     Tenderness: There is no abdominal tenderness.  Musculoskeletal:     Right lower leg: No edema.     Left lower leg: No edema.  Lymphadenopathy:     Cervical: No cervical adenopathy.     Right cervical: No superficial, deep or posterior cervical adenopathy.    Left cervical: No superficial, deep or posterior cervical adenopathy.     Upper Body:     Right upper body: No supraclavicular or axillary  adenopathy.     Left upper body: No supraclavicular or axillary adenopathy.  Neurological:     General: No focal deficit present.     Mental Status: She is alert and oriented to person, place, and time.  Psychiatric:        Mood and Affect: Mood normal.        Behavior: Behavior normal.     LABS:      Latest Ref Rng & Units 02/03/2023    8:37 AM 11/02/2017    2:53 PM 02/18/2015    3:27 PM  CBC  WBC 4.0 - 10.5 K/uL 9.3  12.0  15.7   Hemoglobin 12.0 - 15.0 g/dL 14.0  13.2  13.0   Hematocrit 36.0 - 46.0 % 43.4  41.6  38.8   Platelets 150 - 400 K/uL 266  291  343       Latest Ref Rng & Units 02/03/2023    8:37 AM 11/02/2017    2:53 PM 02/18/2015    3:27 PM  CMP  Glucose 70 - 99 mg/dL 98  103  87   BUN 6 - 20 mg/dL 13  13  9    Creatinine 0.44 - 1.00 mg/dL 0.64  0.71  0.56   Sodium 135 - 145 mmol/L 137  139  141   Potassium 3.5 - 5.1 mmol/L 4.2  3.8  3.8   Chloride 98 - 111 mmol/L 102  105  102   CO2 22 - 32 mmol/L 27  24  25    Calcium 8.9 - 10.3 mg/dL 8.7  8.8  9.1   Total Protein 6.5 - 8.1 g/dL 7.2   6.8   Total Bilirubin 0.3 - 1.2 mg/dL 0.4   0.2   Alkaline Phos 38 - 126 U/L 110   134   AST 15 - 41 U/L 19   14   ALT 0 - 44 U/L 19   11      No results found for: "CEA1", "CEA" / No results found for: "CEA1", "CEA" No results found for: "PSA1" No results found for: "[id number]" No results found for: "CAN125"  No results found for: "TOTALPROTELP", "ALBUMINELP", "A1GS", "A2GS", "BETS", "BETA2SER", "GAMS", "MSPIKE", "SPEI" Lab Results  Component Value Date   FERRITIN 110 11/13/2013   FERRITIN 81 08/07/2013   No results found for: "LDH"   STUDIES:   MM BREAST SURGICAL SPECIMEN  Result Date: 01/10/2023 CLINICAL DATA:  Status post radiofrequency localized left breast lumpectomy. EXAM: SPECIMEN RADIOGRAPH OF THE LEFT BREAST COMPARISON:  Previous exam(s). FINDINGS: Status post excision of the left breast. The radiofrequency localizer and ribbon shaped clip are present within the  specimen. IMPRESSION: Specimen radiograph of the left breast. Electronically Signed   By: Lajean Manes M.D.   On: 01/10/2023  09:00     

## 2023-02-03 ENCOUNTER — Encounter: Payer: Self-pay | Admitting: Hematology

## 2023-02-03 ENCOUNTER — Inpatient Hospital Stay: Payer: BC Managed Care – PPO | Attending: Hematology | Admitting: Hematology

## 2023-02-03 ENCOUNTER — Inpatient Hospital Stay: Payer: BC Managed Care – PPO

## 2023-02-03 VITALS — BP 124/81 | HR 69 | Temp 98.4°F | Resp 17 | Ht 65.0 in | Wt 194.2 lb

## 2023-02-03 DIAGNOSIS — Z803 Family history of malignant neoplasm of breast: Secondary | ICD-10-CM | POA: Insufficient documentation

## 2023-02-03 DIAGNOSIS — Z5111 Encounter for antineoplastic chemotherapy: Secondary | ICD-10-CM | POA: Insufficient documentation

## 2023-02-03 DIAGNOSIS — Z170 Estrogen receptor positive status [ER+]: Secondary | ICD-10-CM | POA: Insufficient documentation

## 2023-02-03 DIAGNOSIS — Z801 Family history of malignant neoplasm of trachea, bronchus and lung: Secondary | ICD-10-CM | POA: Diagnosis not present

## 2023-02-03 DIAGNOSIS — C50412 Malignant neoplasm of upper-outer quadrant of left female breast: Secondary | ICD-10-CM

## 2023-02-03 DIAGNOSIS — Z7901 Long term (current) use of anticoagulants: Secondary | ICD-10-CM | POA: Insufficient documentation

## 2023-02-03 DIAGNOSIS — Z86711 Personal history of pulmonary embolism: Secondary | ICD-10-CM | POA: Insufficient documentation

## 2023-02-03 DIAGNOSIS — Z95828 Presence of other vascular implants and grafts: Secondary | ICD-10-CM

## 2023-02-03 LAB — COMPREHENSIVE METABOLIC PANEL
ALT: 19 U/L (ref 0–44)
AST: 19 U/L (ref 15–41)
Albumin: 3.6 g/dL (ref 3.5–5.0)
Alkaline Phosphatase: 110 U/L (ref 38–126)
Anion gap: 8 (ref 5–15)
BUN: 13 mg/dL (ref 6–20)
CO2: 27 mmol/L (ref 22–32)
Calcium: 8.7 mg/dL — ABNORMAL LOW (ref 8.9–10.3)
Chloride: 102 mmol/L (ref 98–111)
Creatinine, Ser: 0.64 mg/dL (ref 0.44–1.00)
GFR, Estimated: >60 mL/min (ref >60)
Glucose, Bld: 98 mg/dL (ref 70–99)
Potassium: 4.2 mmol/L (ref 3.5–5.1)
Sodium: 137 mmol/L (ref 135–145)
Total Bilirubin: 0.4 mg/dL (ref 0.3–1.2)
Total Protein: 7.2 g/dL (ref 6.5–8.1)

## 2023-02-03 LAB — CBC WITH DIFFERENTIAL/PLATELET
Abs Immature Granulocytes: 0.03 10*3/uL (ref 0.00–0.07)
Basophils Absolute: 0.1 10*3/uL (ref 0.0–0.1)
Basophils Relative: 1 %
Eosinophils Absolute: 0.2 10*3/uL (ref 0.0–0.5)
Eosinophils Relative: 2 %
HCT: 43.4 % (ref 36.0–46.0)
Hemoglobin: 14 g/dL (ref 12.0–15.0)
Immature Granulocytes: 0 %
Lymphocytes Relative: 25 %
Lymphs Abs: 2.3 10*3/uL (ref 0.7–4.0)
MCH: 30.4 pg (ref 26.0–34.0)
MCHC: 32.3 g/dL (ref 30.0–36.0)
MCV: 94.1 fL (ref 80.0–100.0)
Monocytes Absolute: 0.9 10*3/uL (ref 0.1–1.0)
Monocytes Relative: 9 %
Neutro Abs: 5.8 10*3/uL (ref 1.7–7.7)
Neutrophils Relative %: 63 %
Platelets: 266 10*3/uL (ref 150–400)
RBC: 4.61 MIL/uL (ref 3.87–5.11)
RDW: 13.1 % (ref 11.5–15.5)
WBC: 9.3 10*3/uL (ref 4.0–10.5)
nRBC: 0 % (ref 0.0–0.2)

## 2023-02-03 LAB — MAGNESIUM: Magnesium: 1.9 mg/dL (ref 1.7–2.4)

## 2023-02-03 NOTE — Progress Notes (Signed)
START ON PATHWAY REGIMEN - Breast     Cycles 1 through 4: A cycle is every 14 days:     Doxorubicin      Cyclophosphamide      Pegfilgrastim-xxxx    Cycles 5 through 16: A cycle is every 7 days:     Paclitaxel   **Always confirm dose/schedule in your pharmacy ordering system**  Patient Characteristics: Postoperative without Neoadjuvant Therapy, M0 (Pathologic Staging), Invasive Disease, Adjuvant Therapy, HER2 Negative, ER Positive, Node Positive, Node Positive (1-3), Oncotyping Ordered, Oncotype Low/Intermediate Risk (? 25), Premenopausal, Chemotherapy  Candidate Therapeutic Status: Postoperative without Neoadjuvant Therapy, M0 (Pathologic Staging) AJCC Grade: G2 AJCC N Category: pN1a AJCC M Category: cM0 ER Status: Positive (+) AJCC 8 Stage Grouping: IA HER2 Status: Negative (-) Oncotype Dx Recurrence Score: 14 AJCC T Category: pT1c PR Status: Positive (+) Has this patient completed genomic testing<= Yes - Oncotype DX(R) Menopausal Status: Premenopausal Intent of Therapy: Curative Intent, Discussed with Patient

## 2023-02-03 NOTE — Patient Instructions (Addendum)
[facility]  Discharge Instructions  You were seen and examined today by Dr. Delton Coombes.   Dr. Delton Coombes has reviewed the final results of your surgery. Your cancer was completely removed with surgery, this is great news. However, your tumor was larger than anticipated and there was spread of the cancer to two lymph nodes.  There is a recent study of breast cancer patients who are pre-menopausal women, there was a benefit from chemotherapy noted. With that being said, Dr. Delton Coombes has recommended chemotherapy. You will receive two drugs known as Adriamycin and Cytoxan once every 14 days for four doses followed by one drug known as Taxol given once weekly for 12 weeks.  The most common side effects of chemotherapy are hair loss, fatigue, decreased blood counts (decreased immune system), and nausea.  Dr. Delton Coombes sent a test on the tissue removed during surgery known as Oncotype DX. This tests gives your cancer a score for how likely you are to benefit from chemotherapy in preventing the cancer from recurring.   Follow-up as scheduled.  Thank you for choosing [facility] to provide your oncology and hematology care.   To afford each patient quality time with our provider, please arrive at least 15 minutes before your scheduled appointment time. You may need to reschedule your appointment if you arrive late (10 or more minutes). Arriving late affects you and other patients whose appointments are after yours.  Also, if you miss three or more appointments without notifying the office, you may be dismissed from the clinic at the provider's discretion.    Again, thank you for choosing [facility].  Our hope is that these requests will decrease the amount of time that you wait before being seen by our physicians.   If you have a lab appointment with the [facility] - please note that after April 8th, all labs will be drawn in the  cancer center.  You do not have to check in or register with the main entrance as you have in the past but will complete your check-in at the cancer center.            _____________________________________________________________  Should you have questions after your visit to [facility], please contact our office at [phone] and follow the prompts.  Our office hours are 8:00 a.m. to 4:30 p.m. Monday - Thursday and 8:00 a.m. to 2:30 p.m. Friday.  Please note that voicemails left after 4:00 p.m. may not be returned until the following business day.  We are closed weekends and all major holidays.  You do have access to a nurse 24-7, just call the main number to the clinic [phone] and do not press any options, hold on the line and a nurse will answer the phone.    For prescription refill requests, have your pharmacy contact our office and allow 72 hours.    Masks are no longer required in the cancer centers. If you would like for your care team to wear a mask while they are taking care of you, please let them know. You may have one support person who is at least 52 years old accompany you for your appointments.

## 2023-02-04 ENCOUNTER — Other Ambulatory Visit: Payer: Self-pay

## 2023-02-08 ENCOUNTER — Other Ambulatory Visit: Payer: Self-pay | Admitting: Family Medicine

## 2023-02-08 ENCOUNTER — Encounter (HOSPITAL_COMMUNITY): Payer: Self-pay

## 2023-02-08 ENCOUNTER — Encounter: Payer: BC Managed Care – PPO | Admitting: General Surgery

## 2023-02-08 ENCOUNTER — Encounter (HOSPITAL_COMMUNITY)
Admission: RE | Admit: 2023-02-08 | Discharge: 2023-02-08 | Disposition: A | Discharge status: Home / Self Care | Payer: BC Managed Care – PPO | Source: Ambulatory Visit | Attending: General Surgery | Admitting: General Surgery

## 2023-02-08 ENCOUNTER — Other Ambulatory Visit: Payer: Self-pay

## 2023-02-08 ENCOUNTER — Ambulatory Visit (HOSPITAL_COMMUNITY)
Admission: RE | Admit: 2023-02-08 | Discharge: 2023-02-08 | Disposition: A | Discharge status: Home / Self Care | Payer: BC Managed Care – PPO | Source: Ambulatory Visit | Attending: Hematology | Admitting: Hematology

## 2023-02-08 VITALS — Ht 65.0 in | Wt 194.2 lb

## 2023-02-08 DIAGNOSIS — F418 Other specified anxiety disorders: Secondary | ICD-10-CM | POA: Diagnosis not present

## 2023-02-08 DIAGNOSIS — E039 Hypothyroidism, unspecified: Secondary | ICD-10-CM | POA: Insufficient documentation

## 2023-02-08 DIAGNOSIS — N6489 Other specified disorders of breast: Secondary | ICD-10-CM | POA: Diagnosis not present

## 2023-02-08 DIAGNOSIS — C50912 Malignant neoplasm of unspecified site of left female breast: Secondary | ICD-10-CM | POA: Diagnosis present

## 2023-02-08 DIAGNOSIS — Z170 Estrogen receptor positive status [ER+]: Secondary | ICD-10-CM | POA: Insufficient documentation

## 2023-02-08 DIAGNOSIS — Z01818 Encounter for other preprocedural examination: Secondary | ICD-10-CM | POA: Insufficient documentation

## 2023-02-08 DIAGNOSIS — C50412 Malignant neoplasm of upper-outer quadrant of left female breast: Secondary | ICD-10-CM | POA: Diagnosis present

## 2023-02-08 DIAGNOSIS — Z09 Encounter for follow-up examination after completed treatment for conditions other than malignant neoplasm: Secondary | ICD-10-CM | POA: Diagnosis not present

## 2023-02-08 DIAGNOSIS — Z7901 Long term (current) use of anticoagulants: Secondary | ICD-10-CM | POA: Diagnosis not present

## 2023-02-08 DIAGNOSIS — Z86711 Personal history of pulmonary embolism: Secondary | ICD-10-CM | POA: Insufficient documentation

## 2023-02-08 MED ORDER — IOHEXOL 300 MG/ML  SOLN
100.0000 mL | Freq: Once | INTRAMUSCULAR | Status: AC | PRN
  Administered 2023-02-08: 100 mL via INTRAVENOUS

## 2023-02-08 MED ORDER — ENOXAPARIN SODIUM 40 MG/0.4ML IJ SOSY: 40.0000 mg | PREFILLED_SYRINGE | INTRAMUSCULAR | 0 refills | Status: DC

## 2023-02-08 NOTE — Pre-Procedure Instructions (Signed)
Pre-op phone call done. Patient states she sees Dr Henreitta Leber tomorrow, 07/12/2023 at 1400 for pre-op evaluation. She took her Xarelto this morning. I instructed her to hold her xarelto and speak with Dr Henreitta Leber tomorrow at that appointment and follow her instructions about holding Xarelto before procedure. I messaged Dr Henreitta Leber to let her know as well.

## 2023-02-09 ENCOUNTER — Encounter: Payer: Self-pay | Admitting: General Surgery

## 2023-02-09 ENCOUNTER — Ambulatory Visit (INDEPENDENT_AMBULATORY_CARE_PROVIDER_SITE_OTHER): Payer: BC Managed Care – PPO | Admitting: General Surgery

## 2023-02-09 ENCOUNTER — Other Ambulatory Visit: Payer: Self-pay | Admitting: *Deleted

## 2023-02-09 VITALS — BP 141/74 | HR 69 | Temp 98.0°F | Resp 12 | Ht 65.0 in | Wt 195.0 lb

## 2023-02-09 DIAGNOSIS — Z170 Estrogen receptor positive status [ER+]: Secondary | ICD-10-CM

## 2023-02-09 DIAGNOSIS — C50412 Malignant neoplasm of upper-outer quadrant of left female breast: Secondary | ICD-10-CM

## 2023-02-09 NOTE — Progress Notes (Unsigned)
[facility] History and Physical  Reason for Referral:*** Referring Physician: ***  Chief Complaint   Post-op Follow-up     Judy Patton is a 52 y.o. female.  HPI: ***.  The *** started *** and has had a duration of ***.  It is associated with ***.  The *** is improved with ***, and is made worse with ***.    Quality*** Context***  Past Medical History:  Diagnosis Date   Anxiety    Chronic headaches    Depression    Factor V Leiden mutation    Fatigue 02/18/2015   History of kidney stones    Hypothyroidism    MRSA (methicillin resistant Staphylococcus aureus)    Pain of tooth socket 03/14/2015   Pancreatitis    PONV (postoperative nausea and vomiting)    Pulmonary embolus    Restless leg syndrome    Shingles rash 03/14/2015   Shoulder injury    Stress    family   Weight gain 02/18/2015    Past Surgical History:  Procedure Laterality Date   BREAST BIOPSY Left 12/16/2022   [place] LT BREAST BX W LOC DEV 1ST LESION IMG BX SPEC US GUIDE 12/16/2022 [facility]   BREAST BIOPSY Left 01/04/2023   [place] LT RADIO FREQUENCY TAG LOC US GUIDE 01/04/2023 Edwin Cap, MD [facility]   COLONOSCOPY  03/25/2004   WUJ:WJXB canal hemorrhoids, otherwise normal rectum,   DILITATION & CURRETTAGE/HYSTROSCOPY WITH NOVASURE ABLATION N/A 11/08/2017   Procedure: DILATATION & CURETTAGE/HYSTEROSCOPY WITH NOVASURE ENDOMETRIAL ABLATION;  Surgeon: Tilda Burrow, MD;  Location: [facility];  Service: Gynecology;  Laterality: N/A;   ESOPHAGOGASTRODUODENOSCOPY  03/25/2004   JYN:WGNFAO esophagus, small hiatal hernia, otherwise normal stomach   kidney stones  05/2014,11/2014   LAPAROSCOPIC BILATERAL SALPINGECTOMY Bilateral 11/08/2017   Procedure: LAPAROSCOPIC BILATERAL SALPINGECTOMY;  Surgeon: Tilda Burrow, MD;  Location: [facility];  Service: Gynecology;  Laterality: Bilateral;   LITHOTRIPSY  2008   PARTIAL MASTECTOMY WITH AXILLARY SENTINEL LYMPH NODE BIOPSY Left 01/10/2023   Procedure:  PARTIAL MASTECTOMY WITH AXILLARY SENTINEL LYMPH NODE BIOPSY AND RADIOFREQUENCY TAG PLACEMENT;  Surgeon: Lucretia Roers, MD;  Location: [facility];  Service: General;  Laterality: Left;    Family History  Problem Relation Age of Onset   Cancer Brother        kidney   Factor V Leiden deficiency Brother    Fibromyalgia Brother    Cancer Paternal Grandmother        breast cancer   Cancer Paternal Grandfather        lung cancer   Stroke Mother    CAD Mother    Cancer Father        prostate    Social History   Tobacco Use   Smoking status: Never   Smokeless tobacco: Never  Vaping Use   Vaping Use: Never used  Substance Use Topics   Alcohol use: Yes    Comment: occ   Drug use: No    Medications: {medication reviewed/display:3041432} Allergies as of 02/09/2023   No Known Allergies      Medication List        Accurate as of February 09, 2023  2:29 PM. If you have any questions, ask your nurse or doctor.          acetaminophen 500 MG tablet Commonly known as: TYLENOL Take 1,000 mg by mouth every 6 (six) hours as needed for moderate pain or headache.   enoxaparin 40 MG/0.4ML injection Commonly known as: LOVENOX  Inject 0.4 mLs (40 mg total) into the skin daily for 2 days.   ibuprofen 200 MG tablet Commonly known as: ADVIL Take 400 mg by mouth every 6 (six) hours as needed for headache or moderate pain.   levothyroxine 88 MCG tablet Commonly known as: SYNTHROID TAKE 88 MCG BY MOUTH IN THE MORNING   loratadine 10 MG tablet Commonly known as: CLARITIN Take 10 mg by mouth daily.   ondansetron 4 MG tablet Commonly known as: Zofran Take 1 tablet (4 mg total) by mouth every 8 (eight) hours as needed.   oxyCODONE 5 MG immediate release tablet Commonly known as: Roxicodone Take 1 tablet (5 mg total) by mouth every 4 (four) hours as needed for severe pain or breakthrough pain.   rOPINIRole 2 MG tablet Commonly known as: REQUIP TAKE ONE TABLET BY MOUTH AT BEDTIME    Trintellix 20 MG Tabs tablet Generic drug: vortioxetine HBr Take 20 mg by mouth daily.   trolamine salicylate 10 % cream Commonly known as: ASPERCREME Apply 1 application topically as needed for muscle pain.   VISINE DRY EYE RELIEF OP Place 1 drop into both eyes daily as needed (dry eye).   Xarelto 20 MG Tabs tablet Generic drug: rivaroxaban Take 20 mg by mouth in the morning         ROS:  {Review of Systems:30496}  Blood pressure (!) 141/74, pulse 69, temperature 98 F (36.7 C), temperature source Oral, resp. rate 12, height 5\' 5"  (1.651 m), weight 195 lb (88.5 kg), SpO2 98 %. Physical Exam  Results: No results found for this or any previous visit (from the past 48 hour(s)).  CT CHEST ABDOMEN PELVIS W CONTRAST  Result Date: 02/08/2023 CLINICAL DATA:  Left lumpectomy and node biopsy in 2024 for breast cancer. * Tracking Code: BO * EXAM: CT CHEST, ABDOMEN, AND PELVIS WITH CONTRAST TECHNIQUE: Multidetector CT imaging of the chest, abdomen and pelvis was performed following the standard protocol during bolus administration of intravenous contrast. RADIATION DOSE REDUCTION: This exam was performed according to the departmental dose-optimization program which includes automated exposure control, adjustment of the mA and/or kV according to patient size and/or use of iterative reconstruction technique. CONTRAST:  OMNIPAQUE IOHEXOL 300 MG/ML  SOLN COMPARISON:  None Available. FINDINGS: CT CHEST FINDINGS Cardiovascular: Normal caliber of the aorta and branch vessels. Normal heart size, without pericardial effusion. No central pulmonary embolism, on this non-dedicated study. Mediastinum/Nodes: No supraclavicular adenopathy. Left axillary node dissection. No axillary or subpectoral adenopathy. No mediastinal or hilar adenopathy. Lungs/Pleura: No pleural fluid. Minimal motion degradation in the lower chest. No suspicious pulmonary nodule or mass. Musculoskeletal: Left-sided lumpectomy with  mild overlying skin thickening. Remote anterior right second rib fracture. CT ABDOMEN PELVIS FINDINGS Hepatobiliary: Normal liver. Normal gallbladder, without biliary ductal dilatation. Pancreas: Normal, without mass or ductal dilatation. Spleen: Normal in size, without focal abnormality. Adrenals/Urinary Tract: Normal adrenal glands. Right renal collecting system calculi of up to 3 mm. Normal left kidney. No hydronephrosis. Normal urinary bladder. Stomach/Bowel: Normal stomach, without wall thickening. Colonic stool burden suggests constipation. Normal terminal ileum. Appendix not visualized. Normal small bowel. Vascular/Lymphatic: Aortic atherosclerosis. No abdominopelvic adenopathy. Reproductive: Normal uterus and adnexa. Other: No significant free fluid. No free intraperitoneal air. No evidence of omental or peritoneal disease. Musculoskeletal: Left acetabular sclerotic lesion of 1.4 cm on 103/2. Degenerative disc disease at the lumbosacral junction. IMPRESSION: 1. Left acetabular 1.4 cm sclerotic lesion is indeterminate. If no prior imaging for comparison, consider further evaluation with PET.  2. Otherwise, no evidence of metastatic disease, status post left-sided lumpectomy and axillary node dissection. 3. Right nephrolithiasis 4.  Aortic Atherosclerosis (ICD10-I70.0). Electronically Signed   By: Jeronimo Greaves M.D.   On: 02/08/2023 09:08     Assessment & Plan:  SATVIKA THIELEN is a 52 y.o. female with *** -*** -*** -Follow up ***  All questions were answered to the satisfaction of the patient and family***.  The risk and benefits of *** were discussed including but not limited to ***.  After careful consideration, [facility] has decided to ***.    Lucretia Roers 02/09/2023, 2:29 PM

## 2023-02-09 NOTE — Progress Notes (Signed)
FMLA papers completed and faxed with confirmation number. Patient aware.  Fax no. [phone]

## 2023-02-10 ENCOUNTER — Ambulatory Visit (HOSPITAL_COMMUNITY)
Admission: RE | Admit: 2023-02-10 | Discharge: 2023-02-10 | Disposition: A | Discharge status: Home / Self Care | Payer: BC Managed Care – PPO | Source: Ambulatory Visit | Attending: Hematology | Admitting: Hematology

## 2023-02-10 ENCOUNTER — Encounter: Payer: Self-pay | Admitting: Licensed Clinical Social Worker

## 2023-02-10 ENCOUNTER — Telehealth: Payer: BC Managed Care – PPO | Admitting: Licensed Clinical Social Worker

## 2023-02-10 ENCOUNTER — Inpatient Hospital Stay (HOSPITAL_BASED_OUTPATIENT_CLINIC_OR_DEPARTMENT_OTHER): Payer: BC Managed Care – PPO | Admitting: Licensed Clinical Social Worker

## 2023-02-10 DIAGNOSIS — C50412 Malignant neoplasm of upper-outer quadrant of left female breast: Secondary | ICD-10-CM | POA: Insufficient documentation

## 2023-02-10 DIAGNOSIS — Z8042 Family history of malignant neoplasm of prostate: Secondary | ICD-10-CM | POA: Diagnosis not present

## 2023-02-10 DIAGNOSIS — Z803 Family history of malignant neoplasm of breast: Secondary | ICD-10-CM | POA: Diagnosis not present

## 2023-02-10 DIAGNOSIS — Z170 Estrogen receptor positive status [ER+]: Secondary | ICD-10-CM | POA: Insufficient documentation

## 2023-02-10 DIAGNOSIS — C50912 Malignant neoplasm of unspecified site of left female breast: Secondary | ICD-10-CM | POA: Diagnosis not present

## 2023-02-10 MED ORDER — FLUDEOXYGLUCOSE F - 18 (FDG) INJECTION
10.9100 | Freq: Once | INTRAVENOUS | Status: AC | PRN
  Administered 2023-02-10: 10.91 via INTRAVENOUS

## 2023-02-10 NOTE — H&P (Signed)
[facility] History and Physical   Chief Complaint   Post-op Follow-up     Judy Patton is a 52 y.o. female.  HPI:  Patient met with Dr. Ellin Saba and plans to start chemotherapy. Will need a port a catheter placed. She has been having less drainage from the seroma on the left breast. This is only getting on her bra and not requiring a gauze.   She is getting a PET scan tomorrow.   Past Medical History:  Diagnosis Date   Anxiety    Chronic headaches    Depression    Factor V Leiden mutation    Fatigue 02/18/2015   History of kidney stones    Hypothyroidism    MRSA (methicillin resistant Staphylococcus aureus)    Pain of tooth socket 03/14/2015   Pancreatitis    PONV (postoperative nausea and vomiting)    Pulmonary embolus    Restless leg syndrome    Shingles rash 03/14/2015   Shoulder injury    Stress    family   Weight gain 02/18/2015    Past Surgical History:  Procedure Laterality Date   BREAST BIOPSY Left 12/16/2022   [place] LT BREAST BX W LOC DEV 1ST LESION IMG BX SPEC US GUIDE 12/16/2022 [facility]   BREAST BIOPSY Left 01/04/2023   [place] LT RADIO FREQUENCY TAG LOC US GUIDE 01/04/2023 Edwin Cap, MD [facility]   COLONOSCOPY  03/25/2004   HYQ:MVHQ canal hemorrhoids, otherwise normal rectum,   DILITATION & CURRETTAGE/HYSTROSCOPY WITH NOVASURE ABLATION N/A 11/08/2017   Procedure: DILATATION & CURETTAGE/HYSTEROSCOPY WITH NOVASURE ENDOMETRIAL ABLATION;  Surgeon: Tilda Burrow, MD;  Location: [facility];  Service: Gynecology;  Laterality: N/A;   ESOPHAGOGASTRODUODENOSCOPY  03/25/2004   ION:GEXBMW esophagus, small hiatal hernia, otherwise normal stomach   kidney stones  05/2014,11/2014   LAPAROSCOPIC BILATERAL SALPINGECTOMY Bilateral 11/08/2017   Procedure: LAPAROSCOPIC BILATERAL SALPINGECTOMY;  Surgeon: Tilda Burrow, MD;  Location: [facility];  Service: Gynecology;  Laterality: Bilateral;   LITHOTRIPSY  2008   PARTIAL MASTECTOMY WITH AXILLARY SENTINEL  LYMPH NODE BIOPSY Left 01/10/2023   Procedure: PARTIAL MASTECTOMY WITH AXILLARY SENTINEL LYMPH NODE BIOPSY AND RADIOFREQUENCY TAG PLACEMENT;  Surgeon: Lucretia Roers, MD;  Location: [facility];  Service: General;  Laterality: Left;    Family History  Problem Relation Age of Onset   Cancer Brother        kidney   Factor V Leiden deficiency Brother    Fibromyalgia Brother    Cancer Paternal Grandmother        breast cancer   Cancer Paternal Grandfather        lung cancer   Stroke Mother    CAD Mother    Cancer Father        prostate    Social History   Tobacco Use   Smoking status: Never   Smokeless tobacco: Never  Vaping Use   Vaping Use: Never used  Substance Use Topics   Alcohol use: Yes    Comment: occ   Drug use: No    Medications: I have reviewed the patient's current medications. Allergies as of 02/09/2023   No Known Allergies      Medication List        Accurate as of February 09, 2023  2:29 PM. If you have any questions, ask your nurse or doctor.          acetaminophen 500 MG tablet Commonly known as: TYLENOL Take 1,000 mg by mouth every 6 (six) hours as needed  for moderate pain or headache.   enoxaparin 40 MG/0.4ML injection Commonly known as: LOVENOX Inject 0.4 mLs (40 mg total) into the skin daily for 2 days.   ibuprofen 200 MG tablet Commonly known as: ADVIL Take 400 mg by mouth every 6 (six) hours as needed for headache or moderate pain.   levothyroxine 88 MCG tablet Commonly known as: SYNTHROID TAKE 88 MCG BY MOUTH IN THE MORNING   loratadine 10 MG tablet Commonly known as: CLARITIN Take 10 mg by mouth daily.   ondansetron 4 MG tablet Commonly known as: Zofran Take 1 tablet (4 mg total) by mouth every 8 (eight) hours as needed.   oxyCODONE 5 MG immediate release tablet Commonly known as: Roxicodone Take 1 tablet (5 mg total) by mouth every 4 (four) hours as needed for severe pain or breakthrough pain.   rOPINIRole 2 MG  tablet Commonly known as: REQUIP TAKE ONE TABLET BY MOUTH AT BEDTIME   Trintellix 20 MG Tabs tablet Generic drug: vortioxetine HBr Take 20 mg by mouth daily.   trolamine salicylate 10 % cream Commonly known as: ASPERCREME Apply 1 application topically as needed for muscle pain.   VISINE DRY EYE RELIEF OP Place 1 drop into both eyes daily as needed (dry eye).   Xarelto 20 MG Tabs tablet Generic drug: rivaroxaban Take 20 mg by mouth in the morning         ROS:  A comprehensive review of systems was negative except for: Integument/breast: positive for left breast partial mastectomy site with no drainage, minor irritation around the incision  Blood pressure (!) 141/74, pulse 69, temperature 98 F (36.7 C), temperature source Oral, resp. rate 12, height 5\' 5"  (1.651 m), weight 195 lb (88.5 kg), SpO2 98 %. Physical Exam HENT:     Head: Normocephalic.     Nose: Nose normal.  Eyes:     Extraocular Movements: Extraocular movements intact.  Cardiovascular:     Rate and Rhythm: Normal rate.  Pulmonary:     Effort: Pulmonary effort is normal.  Chest:     Comments: Left partial mastectomy site closed, lateral edge with no drainage, minor irritation on the lateral side of the incision  Abdominal:     General: There is no distension.     Palpations: Abdomen is soft.     Tenderness: There is no abdominal tenderness.  Musculoskeletal:        General: Normal range of motion.  Skin:    General: Skin is warm.  Neurological:     General: No focal deficit present.     Mental Status: She is alert.  Psychiatric:        Mood and Affect: Mood normal.     Results: No results found for this or any previous visit (from the past 48 hour(s)).  CT CHEST ABDOMEN PELVIS W CONTRAST  Result Date: 02/08/2023 CLINICAL DATA:  Left lumpectomy and node biopsy in 2024 for breast cancer. * Tracking Code: BO * EXAM: CT CHEST, ABDOMEN, AND PELVIS WITH CONTRAST TECHNIQUE: Multidetector CT imaging of  the chest, abdomen and pelvis was performed following the standard protocol during bolus administration of intravenous contrast. RADIATION DOSE REDUCTION: This exam was performed according to the departmental dose-optimization program which includes automated exposure control, adjustment of the mA and/or kV according to patient size and/or use of iterative reconstruction technique. CONTRAST:  OMNIPAQUE IOHEXOL 300 MG/ML  SOLN COMPARISON:  None Available. FINDINGS: CT CHEST FINDINGS Cardiovascular: Normal caliber of the aorta and branch  vessels. Normal heart size, without pericardial effusion. No central pulmonary embolism, on this non-dedicated study. Mediastinum/Nodes: No supraclavicular adenopathy. Left axillary node dissection. No axillary or subpectoral adenopathy. No mediastinal or hilar adenopathy. Lungs/Pleura: No pleural fluid. Minimal motion degradation in the lower chest. No suspicious pulmonary nodule or mass. Musculoskeletal: Left-sided lumpectomy with mild overlying skin thickening. Remote anterior right second rib fracture. CT ABDOMEN PELVIS FINDINGS Hepatobiliary: Normal liver. Normal gallbladder, without biliary ductal dilatation. Pancreas: Normal, without mass or ductal dilatation. Spleen: Normal in size, without focal abnormality. Adrenals/Urinary Tract: Normal adrenal glands. Right renal collecting system calculi of up to 3 mm. Normal left kidney. No hydronephrosis. Normal urinary bladder. Stomach/Bowel: Normal stomach, without wall thickening. Colonic stool burden suggests constipation. Normal terminal ileum. Appendix not visualized. Normal small bowel. Vascular/Lymphatic: Aortic atherosclerosis. No abdominopelvic adenopathy. Reproductive: Normal uterus and adnexa. Other: No significant free fluid. No free intraperitoneal air. No evidence of omental or peritoneal disease. Musculoskeletal: Left acetabular sclerotic lesion of 1.4 cm on 103/2. Degenerative disc disease at the lumbosacral  junction. IMPRESSION: 1. Left acetabular 1.4 cm sclerotic lesion is indeterminate. If no prior imaging for comparison, consider further evaluation with PET. 2. Otherwise, no evidence of metastatic disease, status post left-sided lumpectomy and axillary node dissection. 3. Right nephrolithiasis 4.  Aortic Atherosclerosis (ICD10-I70.0). Electronically Signed   By: Jeronimo GreavesKyle  Talbot M.D.   On: 02/08/2023 09:08     Assessment & Plan:  Alain MarionKaren J Belknap is a 52 y.o. female with left sided breast cancer. She will need a port. Her seroma has stopped draining. Will monitor the site.   Discussed port placement and risk of bleeding, infection, pneumothorax.  All questions were answered to the satisfaction of the patient and family.     Lucretia RoersLindsay C Sherylann Vangorden 02/09/2023, 2:29 PM

## 2023-02-10 NOTE — Progress Notes (Signed)
REFERRING PROVIDER: [name], MD [address] [place],  [place] [address]  PRIMARY PROVIDER:  [name], PA-C  PRIMARY REASON FOR VISIT:  1. Malignant neoplasm of upper-outer quadrant of left breast in female, estrogen receptor positive   2. Family history of breast cancer   3. Family history of prostate cancer    I connected with Ms. Fralix on 02/10/2023 at 11:10 AM EDT by MyChart video conference and verified that I am speaking with the correct person using two identifiers.    Patient location: home Provider location: [facility]  HISTORY OF PRESENT ILLNESS:   Ms. Eleby, a 52 y.o. female, was seen for a [facility] cancer genetics consultation at the request of Dr. Ellin Saba due to a personal and family history of cancer.  Ms. Knudtson presents to clinic today to discuss the possibility of a hereditary predisposition to cancer, genetic testing, and to further clarify her future cancer risks, as well as potential cancer risks for family members.   In 2024, at the age of 64, Ms. Winemiller was diagnosed with invasive ductal carcinoma of the left breast, ER+/PR+/HER2-. The treatment plan included left lumpectomy on 01/10/2023, adjuvant chemotherapy, adjuvant radiation and adjuvant antiestrogen therapy.  CANCER HISTORY:  Oncology History  Breast cancer of upper-outer quadrant of left female breast  12/23/2022 Initial Diagnosis   Breast cancer of upper-outer quadrant of left female breast   02/22/2023 -  Chemotherapy   Patient is on Treatment Plan : BREAST ADJUVANT DOSE DENSE AC q14d / PACLitaxel q7d       RISK FACTORS:  Menarche was at age 75.  First live birth at age 51. Ovaries intact: yes.  Hysterectomy: no. Colonoscopy: yes; normal.  Past Medical History:  Diagnosis Date   Anxiety    Chronic headaches    Depression    Factor V Leiden mutation    Fatigue 02/18/2015   History of kidney stones    Hypothyroidism    MRSA (methicillin resistant Staphylococcus aureus)     Pain of tooth socket 03/14/2015   Pancreatitis    PONV (postoperative nausea and vomiting)    Pulmonary embolus    Restless leg syndrome    Shingles rash 03/14/2015   Shoulder injury    Stress    family   Weight gain 02/18/2015    Past Surgical History:  Procedure Laterality Date   BREAST BIOPSY Left 12/16/2022   [place] LT BREAST BX W LOC DEV 1ST LESION IMG BX SPEC US GUIDE 12/16/2022 [facility]   BREAST BIOPSY Left 01/04/2023   [place] LT RADIO FREQUENCY TAG LOC US GUIDE 01/04/2023 Edwin Cap, MD [facility]   COLONOSCOPY  03/25/2004   OVP:CHEK canal hemorrhoids, otherwise normal rectum,   DILITATION & CURRETTAGE/HYSTROSCOPY WITH NOVASURE ABLATION N/A 11/08/2017   Procedure: DILATATION & CURETTAGE/HYSTEROSCOPY WITH NOVASURE ENDOMETRIAL ABLATION;  Surgeon: Tilda Burrow, MD;  Location: [facility];  Service: Gynecology;  Laterality: N/A;   ESOPHAGOGASTRODUODENOSCOPY  03/25/2004   BTC:YELYHT esophagus, small hiatal hernia, otherwise normal stomach   kidney stones  05/2014,11/2014   LAPAROSCOPIC BILATERAL SALPINGECTOMY Bilateral 11/08/2017   Procedure: LAPAROSCOPIC BILATERAL SALPINGECTOMY;  Surgeon: Tilda Burrow, MD;  Location: [facility];  Service: Gynecology;  Laterality: Bilateral;   LITHOTRIPSY  2008   PARTIAL MASTECTOMY WITH AXILLARY SENTINEL LYMPH NODE BIOPSY Left 01/10/2023   Procedure: PARTIAL MASTECTOMY WITH AXILLARY SENTINEL LYMPH NODE BIOPSY AND RADIOFREQUENCY TAG PLACEMENT;  Surgeon: Lucretia Roers, MD;  Location: [facility];  Service: General;  Laterality: Left;  FAMILY HISTORY:  We obtained a detailed, 4-generation family history.  Significant diagnoses are listed below: Family History  Problem Relation Age of Onset   Stroke Mother    CAD Mother    Prostate cancer Father 35   Kidney cancer Brother        dx 47s   Factor V Leiden deficiency Brother    Fibromyalgia Brother    Breast cancer Paternal Grandmother        dx >50   Lung cancer Paternal Grandfather    Ms.  Cerone has 2 sons, 10 and 60, no cancers. She has 1 brother who had kidney cancer in his 13s.   Ms. Chester mother is living at 75. No known cancers on her side of the family.  Ms. Melms father had prostate cancer at 51 and is living at 14. Paternal grandmother had breast cancer, unknown age. Paternal grandfather had lung cancer. Paternal cousin was recently diagnosed with thyroid cancer in his 49s.   Ms. Brack is unaware of previous family history of genetic testing for hereditary cancer risks. There is no reported Ashkenazi Jewish ancestry. There is no known consanguinity.    GENETIC COUNSELING ASSESSMENT: Ms. Bachand is a 52 y.o. female with a personal and family history of breast cancer which is somewhat suggestive of a hereditary cancer syndrome and predisposition to cancer. We, therefore, discussed and recommended the following at today's visit.   DISCUSSION: We discussed that approximately 10% of breast cancer is hereditary. Most cases of hereditary breast cancer are associated with BRCA1/BRCA2 genes, although there are other genes associated with hereditary cancer as well. Cancers and risks are gene specific. We discussed that testing is beneficial for several reasons including knowing about cancer risks, identifying potential screening and risk-reduction options that may be appropriate, and to understand if other family members could be at risk for cancer and allow them to undergo genetic testing.   We reviewed the characteristics, features and inheritance patterns of hereditary cancer syndromes. We also discussed genetic testing, including the appropriate family members to test, the process of testing, insurance coverage and turn-around-time for results. We discussed the implications of a negative, positive and/or variant of uncertain significant result. We recommended Ms. [facility] pursue genetic testing for the [facility] Multi-Cancer+RNA gene panel.   Based on Ms. Demerchant personal and family history of  cancer, she meets medical criteria for genetic testing. Despite that she meets criteria, she may still have an out of pocket cost. We discussed that if her out of pocket cost for testing is over $100, the laboratory will call and confirm whether she wants to proceed with testing.  If the out of pocket cost of testing is less than $100 she will be billed by the genetic testing laboratory.   PLAN: After considering the risks, benefits, and limitations, Ms. Bianchi did not wish to pursue genetic testing at today's visit.  She would like to wait until another time. We understand this decision and remain available to coordinate genetic testing at any time in the future. We, therefore, recommend Ms. Morgan continue to follow the cancer screening guidelines given by her primary healthcare provider.  Ms. Neimeyer questions were answered to her satisfaction today. Our contact information was provided should additional questions or concerns arise. Thank you for the referral and allowing [place] to share in the care of your patient.   Lacy Duverney, MS, [facility] Genetic Counselor [email or web address] Phone: [phone]  The patient was seen for a total of 15 minutes in virtual  genetic counseling.  Dr. Grayland Ormond was available for discussion regarding this case.   _______________________________________________________________________ For Office Staff:  Number of people involved in session: 1 Was an Intern/ student involved with case: no

## 2023-02-11 ENCOUNTER — Ambulatory Visit (HOSPITAL_COMMUNITY): Payer: BC Managed Care – PPO

## 2023-02-11 ENCOUNTER — Ambulatory Visit (HOSPITAL_COMMUNITY): Payer: BC Managed Care – PPO | Admitting: Anesthesiology

## 2023-02-11 ENCOUNTER — Ambulatory Visit (HOSPITAL_COMMUNITY)
Admission: RE | Admit: 2023-02-11 | Discharge: 2023-02-11 | Disposition: A | Discharge status: Home / Self Care | Payer: BC Managed Care – PPO | Source: Ambulatory Visit | Attending: General Surgery | Admitting: General Surgery

## 2023-02-11 ENCOUNTER — Encounter (HOSPITAL_COMMUNITY)
Admission: RE | Disposition: A | Discharge status: Home / Self Care | Payer: Self-pay | Source: Ambulatory Visit | Attending: General Surgery

## 2023-02-11 DIAGNOSIS — Z170 Estrogen receptor positive status [ER+]: Secondary | ICD-10-CM | POA: Insufficient documentation

## 2023-02-11 DIAGNOSIS — E039 Hypothyroidism, unspecified: Secondary | ICD-10-CM | POA: Diagnosis not present

## 2023-02-11 DIAGNOSIS — Z86711 Personal history of pulmonary embolism: Secondary | ICD-10-CM | POA: Diagnosis not present

## 2023-02-11 DIAGNOSIS — C50412 Malignant neoplasm of upper-outer quadrant of left female breast: Secondary | ICD-10-CM | POA: Diagnosis not present

## 2023-02-11 DIAGNOSIS — N6489 Other specified disorders of breast: Secondary | ICD-10-CM | POA: Insufficient documentation

## 2023-02-11 DIAGNOSIS — C50912 Malignant neoplasm of unspecified site of left female breast: Secondary | ICD-10-CM | POA: Diagnosis not present

## 2023-02-11 DIAGNOSIS — Z7901 Long term (current) use of anticoagulants: Secondary | ICD-10-CM | POA: Diagnosis not present

## 2023-02-11 DIAGNOSIS — F418 Other specified anxiety disorders: Secondary | ICD-10-CM | POA: Insufficient documentation

## 2023-02-11 DIAGNOSIS — Z09 Encounter for follow-up examination after completed treatment for conditions other than malignant neoplasm: Secondary | ICD-10-CM | POA: Diagnosis not present

## 2023-02-11 DIAGNOSIS — Z01818 Encounter for other preprocedural examination: Secondary | ICD-10-CM

## 2023-02-11 HISTORY — PX: PORTACATH PLACEMENT: SHX2246

## 2023-02-11 LAB — POCT PREGNANCY, URINE: Preg Test, Ur: NEGATIVE

## 2023-02-11 SURGERY — INSERTION, TUNNELED CENTRAL VENOUS DEVICE, WITH PORT
Anesthesia: General | Site: Chest | Laterality: Right

## 2023-02-11 MED ORDER — CHLORHEXIDINE GLUCONATE CLOTH 2 % EX PADS: 6.0000 | MEDICATED_PAD | Freq: Once | CUTANEOUS | Status: DC

## 2023-02-11 MED ORDER — MIDAZOLAM HCL 5 MG/5ML IJ SOLN
INTRAMUSCULAR | Status: DC | PRN
  Administered 2023-02-11: 2 mg via INTRAVENOUS

## 2023-02-11 MED ORDER — PROPOFOL 10 MG/ML IV BOLUS
INTRAVENOUS | Status: DC | PRN
  Administered 2023-02-11: 25 mg via INTRAVENOUS

## 2023-02-11 MED ORDER — LIDOCAINE HCL (PF) 1 % IJ SOLN
INTRAMUSCULAR | Status: AC
  Filled 2023-02-11: qty 30

## 2023-02-11 MED ORDER — LACTATED RINGERS IV SOLN: INTRAVENOUS | Status: DC

## 2023-02-11 MED ORDER — CHLORHEXIDINE GLUCONATE 0.12 % MT SOLN
15.0000 mL | Freq: Once | OROMUCOSAL | Status: AC
  Administered 2023-02-11: 15 mL via OROMUCOSAL

## 2023-02-11 MED ORDER — LIDOCAINE HCL (PF) 2 % IJ SOLN
INTRAMUSCULAR | Status: AC
  Filled 2023-02-11: qty 5

## 2023-02-11 MED ORDER — PROPOFOL 500 MG/50ML IV EMUL
INTRAVENOUS | Status: AC
  Filled 2023-02-11: qty 50

## 2023-02-11 MED ORDER — ORAL CARE MOUTH RINSE: 15.0000 mL | Freq: Once | OROMUCOSAL | Status: AC

## 2023-02-11 MED ORDER — PROPOFOL 500 MG/50ML IV EMUL
INTRAVENOUS | Status: DC | PRN
  Administered 2023-02-11: 150 ug/kg/min via INTRAVENOUS

## 2023-02-11 MED ORDER — FENTANYL CITRATE (PF) 100 MCG/2ML IJ SOLN
INTRAMUSCULAR | Status: AC
  Filled 2023-02-11: qty 2

## 2023-02-11 MED ORDER — SODIUM CHLORIDE (PF) 0.9 % IJ SOLN
INTRAMUSCULAR | Status: DC | PRN
  Administered 2023-02-11: 500 mL

## 2023-02-11 MED ORDER — HEPARIN SOD (PORK) LOCK FLUSH 100 UNIT/ML IV SOLN
INTRAVENOUS | Status: AC
  Filled 2023-02-11: qty 5

## 2023-02-11 MED ORDER — HEPARIN SOD (PORK) LOCK FLUSH 100 UNIT/ML IV SOLN
INTRAVENOUS | Status: DC | PRN
  Administered 2023-02-11: 500 [IU] via INTRAVENOUS

## 2023-02-11 MED ORDER — FENTANYL CITRATE (PF) 100 MCG/2ML IJ SOLN
INTRAMUSCULAR | Status: DC | PRN
  Administered 2023-02-11: 25 ug via INTRAVENOUS
  Administered 2023-02-11: 50 ug via INTRAVENOUS
  Administered 2023-02-11: 25 ug via INTRAVENOUS

## 2023-02-11 MED ORDER — OXYCODONE HCL 5 MG PO TABS: 5.0000 mg | ORAL_TABLET | ORAL | 0 refills | Status: AC | PRN

## 2023-02-11 MED ORDER — CEFAZOLIN SODIUM-DEXTROSE 2-4 GM/100ML-% IV SOLN
2.0000 g | INTRAVENOUS | Status: AC
  Administered 2023-02-11: 2 g via INTRAVENOUS
  Filled 2023-02-11: qty 100

## 2023-02-11 MED ORDER — MIDAZOLAM HCL 2 MG/2ML IJ SOLN
INTRAMUSCULAR | Status: AC
  Filled 2023-02-11: qty 2

## 2023-02-11 MED ORDER — ONDANSETRON HCL 4 MG/2ML IJ SOLN
INTRAMUSCULAR | Status: DC | PRN
  Administered 2023-02-11: 4 mg via INTRAVENOUS

## 2023-02-11 MED ORDER — DIPHENHYDRAMINE HCL 50 MG/ML IJ SOLN
INTRAMUSCULAR | Status: DC | PRN
  Administered 2023-02-11: 12.5 mg via INTRAVENOUS

## 2023-02-11 MED ORDER — LIDOCAINE HCL (PF) 1 % IJ SOLN
INTRAMUSCULAR | Status: DC | PRN
  Administered 2023-02-11: 9 mL

## 2023-02-11 MED ORDER — ORAL CARE MOUTH RINSE: 15.0000 mL | Freq: Once | OROMUCOSAL | Status: DC

## 2023-02-11 MED ORDER — CHLORHEXIDINE GLUCONATE 0.12 % MT SOLN: 15.0000 mL | Freq: Once | OROMUCOSAL | Status: DC

## 2023-02-11 SURGICAL SUPPLY — 31 items
ADH SKN CLS APL DERMABOND .7 (GAUZE/BANDAGES/DRESSINGS) ×1
APL PRP STRL LF ISPRP CHG 10.5 (MISCELLANEOUS) ×1
APPLICATOR CHLORAPREP 10.5 ORG (MISCELLANEOUS) ×1 IMPLANT
BAG DECANTER FOR FLEXI CONT (MISCELLANEOUS) ×1 IMPLANT
CLOTH BEACON ORANGE TIMEOUT ST (SAFETY) ×1 IMPLANT
COVER LIGHT HANDLE (MISCELLANEOUS) IMPLANT
DERMABOND ADVANCED .7 DNX12 (GAUZE/BANDAGES/DRESSINGS) ×1 IMPLANT
DRAPE C-ARM FOLDED MOBILE STRL (DRAPES) ×1 IMPLANT
ELECT REM PT RETURN 9FT ADLT (ELECTROSURGICAL) ×1
ELECTRODE REM PT RTRN 9FT ADLT (ELECTROSURGICAL) ×1 IMPLANT
GLOVE BIO SURGEON STRL SZ 6.5 (GLOVE) ×1 IMPLANT
GLOVE BIOGEL PI IND STRL 6.5 (GLOVE) ×1 IMPLANT
GLOVE BIOGEL PI IND STRL 7.0 (GLOVE) ×2 IMPLANT
GLOVE ECLIPSE 6.5 STRL STRAW (GLOVE) IMPLANT
GOWN STRL REUS W/TWL LRG LVL3 (GOWN DISPOSABLE) ×2 IMPLANT
IV NS 500ML (IV SOLUTION) ×1
IV NS 500ML BAXH (IV SOLUTION) ×1 IMPLANT
KIT PORT POWER 8FR ISP MRI (Port) ×1 IMPLANT
KIT TURNOVER KIT A (KITS) ×1 IMPLANT
MANIFOLD NEPTUNE II (INSTRUMENTS) ×1 IMPLANT
NDL HYPO 25X1 1.5 SAFETY (NEEDLE) ×1 IMPLANT
NEEDLE HYPO 25X1 1.5 SAFETY (NEEDLE) ×1 IMPLANT
PACK MINOR (CUSTOM PROCEDURE TRAY) ×1 IMPLANT
PAD ARMBOARD 7.5X6 YLW CONV (MISCELLANEOUS) ×1 IMPLANT
SET BASIN LINEN APH (SET/KITS/TRAYS/PACK) ×1 IMPLANT
SUT MNCRL AB 4-0 PS2 18 (SUTURE) ×1 IMPLANT
SUT PROLENE 2 0 SH 30 (SUTURE) ×1 IMPLANT
SUT VIC AB 3-0 SH 27 (SUTURE) ×1
SUT VIC AB 3-0 SH 27X BRD (SUTURE) ×1 IMPLANT
SYR 10ML LL (SYRINGE) ×2 IMPLANT
SYR CONTROL 10ML LL (SYRINGE) ×1 IMPLANT

## 2023-02-11 NOTE — Discharge Instructions (Addendum)
Keep area clean and dry. You can take a shower in 24 hours. Do not submerge in water.  Take tylenol and ibuprofen for pain control and roxicodone for severe pain.  You can restart your Noel Christmas today.

## 2023-02-11 NOTE — Anesthesia Postprocedure Evaluation (Signed)
Anesthesia Post Note  Patient: Judy Patton [facility]  Procedure(s) Performed: INSERTION PORT-A-CATH (Right: Chest)  Patient location during evaluation: Phase II Anesthesia Type: General Level of consciousness: awake Pain management: pain level controlled Vital Signs Assessment: post-procedure vital signs reviewed and stable Respiratory status: spontaneous breathing and respiratory function stable Cardiovascular status: blood pressure returned to baseline and stable Postop Assessment: no headache and no apparent nausea or vomiting Anesthetic complications: no Comments: Late entry   No notable events documented.   Last Vitals:  Vitals:   02/11/23 1130 02/11/23 1149  BP: 115/69 121/61  Pulse: 61 62  Resp: 14   Temp:  36.6 C  SpO2: 100% 99%    Last Pain:  Vitals:   02/11/23 1149  TempSrc:   PainSc: 0-No pain                 Windell Norfolk

## 2023-02-11 NOTE — Anesthesia Preprocedure Evaluation (Signed)
Anesthesia Evaluation  Patient identified by MRN, date of birth, ID band Patient awake    Reviewed: Allergy & Precautions, H&P , NPO status , Patient's Chart, lab work & pertinent test results, reviewed documented beta blocker date and time   History of Anesthesia Complications (+) PONV and history of anesthetic complications  Airway Mallampati: II  TM Distance: >3 FB Neck ROM: full    Dental no notable dental hx.    Pulmonary neg pulmonary ROS   Pulmonary exam normal breath sounds clear to auscultation       Cardiovascular Exercise Tolerance: Good negative cardio ROS  Rhythm:regular Rate:Normal     Neuro/Psych  Headaches PSYCHIATRIC DISORDERS Anxiety Depression    negative neurological ROS  negative psych ROS   GI/Hepatic negative GI ROS, Neg liver ROS,,,  Endo/Other  negative endocrine ROSHypothyroidism    Renal/GU negative Renal ROS  negative genitourinary   Musculoskeletal   Abdominal   Peds  Hematology negative hematology ROS (+)   Anesthesia Other Findings   Reproductive/Obstetrics negative OB ROS                             Anesthesia Physical Anesthesia Plan  ASA: 3  Anesthesia Plan: General   Post-op Pain Management:    Induction:   PONV Risk Score and Plan: Propofol infusion  Airway Management Planned:   Additional Equipment:   Intra-op Plan:   Post-operative Plan:   Informed Consent: I have reviewed the patients History and Physical, chart, labs and discussed the procedure including the risks, benefits and alternatives for the proposed anesthesia with the patient or authorized representative who has indicated his/her understanding and acceptance.     Dental Advisory Given  Plan Discussed with: CRNA  Anesthesia Plan Comments:        Anesthesia Quick Evaluation

## 2023-02-11 NOTE — Anesthesia Procedure Notes (Signed)
Procedure Name: MAC Date/Time: 02/11/2023 10:35 AM  Performed by: Adria Dill, CRNAPre-anesthesia Checklist: Patient identified, Emergency Drugs available, Suction available and Patient being monitored Patient Re-evaluated:Patient Re-evaluated prior to induction Oxygen Delivery Method: Nasal cannula Preoxygenation: Pre-oxygenation with 100% oxygen Induction Type: IV induction Placement Confirmation: positive ETCO2 and breath sounds checked- equal and bilateral Dental Injury: Teeth and Oropharynx as per pre-operative assessment

## 2023-02-11 NOTE — Op Note (Signed)
Operative Note 02/11/23   Preoperative Diagnosis: Left breast cancer    Postoperative Diagnosis: Same   Procedure(s) Performed: Port-A-Cath placement, right subclavian    Surgeon: Leatrice Jewels. Henreitta Leber, MD   Assistants: No qualified resident was available   Anesthesia: Monitored anesthesia care   Anesthesiologist: Dr. Johnnette Litter, MD    Specimens: None   Estimated Blood Loss: Minimal   Blood Replacement: None    Complications: None    Operative Findings: Normal anatomy  Indications: Ms. Dicenzo is a 52 yo with left breast cancer that needs chemotherapy. We discussed placement and risk of bleeding, infection, pneumothorax, injury to vessels.   Procedure: The patient was brought into the operating room and monitored anesthesia care was induced.  One percent lidocaine was used for local anesthesia.   The right chest and neck was prepped and draped in the usual sterile fashion.  Preoperative antibiotics were given.  An incision was made below the right clavicle. A subcutaneous pocket was formed. The needles advanced into the subclavian vein using the Seldinger technique without difficulty. A guidewire was then advanced into the right atrium under fluoroscopic guidance.  Ectopia was noted and pulled back. An introducer and peel-away sheath were placed over the guidewire. The catheter was then inserted through the peel-away sheath and the peel-away sheath was removed.  A spot film was performed to confirm the position. The catheter was then attached to the port and the port placed in subcutaneous pocket. Adequate positioning was confirmed by fluoroscopy. Hemostasis was confirmed, and the port was secured with 2-0 prolene sutures.  Good backflow of blood was noted on aspiration of the port. The port was flushed with heparin flush. Subcutaneous layer was reapproximated using a 3-0 Vicryl interrupted suture. The skin was closed using a 4-0 Vicryl subcuticular suture. Dermabond was applied.    All tape and  needle counts were correct at the end of the procedure. The patient was transferred to PACU in stable condition. A chest x-ray will be performed at that time.  Algis Greenhouse, MD [facility] [address] [place], [place] [address] [phone] (office)

## 2023-02-11 NOTE — Progress Notes (Addendum)
[facility]  Updated husband. Can restart xarelto today. Rx sent to pharmacy. PRN follow up.  Left partial mastectomy site healing, no further drainage, irritation around the skin looks better than earlier in week, no signs of infection   Algis Greenhouse, MD [facility] [address] [place], [place] [address] [phone] (office)

## 2023-02-11 NOTE — Interval H&P Note (Signed)
History and Physical Interval Note:  02/11/2023 10:00 AM  Judy Patton  has presented today for surgery, with the diagnosis of BREAST CANCER, LEFT ER POSITIVE.  The various methods of treatment have been discussed with the patient and family. After consideration of risks, benefits and other options for treatment, the patient has consented to  Procedure(s): INSERTION PORT-A-CATH (N/A) as a surgical intervention.  The patient's history has been reviewed, patient examined, no change in status, stable for surgery.  I have reviewed the patient's chart and labs.  Questions were answered to the patient's satisfaction.    No changes.  Lucretia Roers

## 2023-02-11 NOTE — Transfer of Care (Signed)
Immediate Anesthesia Transfer of Care Note  Patient: [facility]  Procedure(s) Performed: INSERTION PORT-A-CATH (Chest)  Patient Location: PACU  Anesthesia Type:MAC  Level of Consciousness: awake and patient cooperative  Airway & Oxygen Therapy: Patient Spontanous Breathing and Patient connected to nasal cannula oxygen  Post-op Assessment: Report given to RN and Post -op Vital signs reviewed and stable  Post vital signs: Reviewed and stable  Last Vitals:  Vitals Value Taken Time  BP    Temp    Pulse 81 02/11/23 1100  Resp 16 02/11/23 1100  SpO2 95 % 02/11/23 1100  Vitals shown include unvalidated device data.  Last Pain:  Vitals:   02/11/23 1003  TempSrc: Oral  PainSc: 0-No pain      Patients Stated Pain Goal: 5 (02/11/23 1003)  Complications: No notable events documented.

## 2023-02-14 ENCOUNTER — Encounter: Payer: Self-pay | Admitting: Hematology

## 2023-02-14 ENCOUNTER — Inpatient Hospital Stay: Payer: BC Managed Care – PPO

## 2023-02-14 ENCOUNTER — Inpatient Hospital Stay: Payer: BC Managed Care – PPO | Admitting: Dietician

## 2023-02-14 DIAGNOSIS — C50412 Malignant neoplasm of upper-outer quadrant of left female breast: Secondary | ICD-10-CM

## 2023-02-14 DIAGNOSIS — Z95828 Presence of other vascular implants and grafts: Secondary | ICD-10-CM

## 2023-02-14 HISTORY — DX: Presence of other vascular implants and grafts: Z95.828

## 2023-02-14 MED ORDER — LIDOCAINE-PRILOCAINE 2.5-2.5 % EX CREA
TOPICAL_CREAM | CUTANEOUS | 3 refills | Status: AC
Start: 2023-02-14 — End: ?

## 2023-02-14 MED ORDER — PROCHLORPERAZINE MALEATE 10 MG PO TABS
10.0000 mg | ORAL_TABLET | Freq: Four times a day (QID) | ORAL | 1 refills | Status: DC | PRN
Start: 2023-02-14 — End: 2023-02-22

## 2023-02-14 NOTE — Progress Notes (Signed)
Nutrition Assessment   Reason for Assessment: New Pt   ASSESSMENT: 52 year old female with newly diagnosed stage I breast cancer of left UOQ, estrogen receptor positive. S/p left breast lumpectomy and SLNB 3/11. Patient is pending start of adjuvant dose dense AC q14d followed by paclitaxel (first chemo planned 4/23). Patient is under the care of Dr. Ellin Saba.   Past medical history includes shingles, RLS, h/o PE, anxiety, hypothyroidism, MRSA, Factor V Leiden mutation, pancreatitis  Met with patient and husband in clinic following chemo teaching. Patient overwhelmed with information. She reports appetite is good, sometimes too good. Patient recalls typically eating 3 smaller meals + snacks. Patient recalls cereal bar or protein bar for breakfast, salad for lunch (walnuts, feta, cranberries), snacks on peanut butter pretzels after getting home from work. Patient recalls 2 hotdogs, fries and a shake for dinner last night. Patient has a cup of coffee on cold mornings. She also drinks milk, juice, and 3 bottles of water.    Nutrition Focused Physical Exam: deferred     Medications: synthroid, zofran, oxycodone, compazine, requip, xarelto, trintellix   Labs: 4/4 labs reviewed    Anthropometrics:   Height: 5'5" Weight: 195 lb  UBW: 189-190 lb BMI: 32.45    NUTRITION DIAGNOSIS: Food and nutrition related knowledge deficit related to newly diagnosed cancer as evidenced by no prior need for associated information   INTERVENTION:  Educated on importance of adequate calorie and protein energy intake to maintain strength/weights during treatment Encouraged small frequent meals and snacks  Discussed foods with protein. Encouraged protein source with all meals and snacks Educated on importance of hydration and ways to increase daily fluid intake  Contact information given    MONITORING, EVALUATION, GOAL: Patient will tolerate increased calories and protein to minimize weight loss  during treatment    Next Visit: Monday May 6 via telephone

## 2023-02-14 NOTE — Progress Notes (Signed)

## 2023-02-14 NOTE — Patient Instructions (Addendum)
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 We will treat you in the clinic every 2 weeks with a combination of chemotherapy drugs. Those drugs are doxorubicin (Adriamycin) and cyclophosphamide (Cytoxan).  You will receive these two drugs for a total of 4 cycles, after which you will come weekly for 12 weeks to the clinic to receive a chemotherapy drug called Taxol.  The intent of treatment is cure and to prevent recurrence of this cancer. You will see the doctor regularly throughout treatment.  We will obtain blood work from you prior to every treatment and monitor your results to make sure it is safe to give your treatment. The doctor monitors your response to treatment by the way you are feeling, your blood work, and by obtaining scans periodically.  There will be wait times while you are here for treatment. It will take about 30 minutes to 1 hour for your lab work to result. Then there will be wait times while pharmacy mixes your medications.       Medications you will receive in the clinic prior to your Adriamycin and Cytoxan:    Aloxi:  ALOXI is used in adults to help prevent nausea and vomiting that happens with certain chemotherapy drugs.  Aloxi is a long acting medication, and will remain in your system for about two days.    Emend:  This is an anti-nausea medication that is used with Aloxi to help prevent nausea and vomiting caused by chemotherapy.   Dexamethasone:  This is a steroid given prior to chemotherapy to help prevent allergic reactions; it may also help prevent and control nausea and diarrhea.       Medications you will receive in the clinic prior to your Taxol:   Aloxi:  ALOXI is used in adults to help prevent the nausea and vomiting that happens with certain chemotherapies.  Aloxi is a long acting medication, and will remain in your system for about 2 days.    Pepcid:  This medication is a  histamine blocker that helps prevent and allergic reaction to your chemotherapy.    Dexamethasone:  This is a steroid given prior to chemotherapy to help prevent allergic reactions; it may also help prevent and control nausea and diarrhea.    Benadryl:  This is a histamine blocker (different from the Pepcid) that helps prevent allergic/infusion reactions to your chemotherapy. This medication may cause dizziness/drowsiness.    Injection you will receive after each treatment with Adrimycin and Cytoxan:  Udenyca (Neulasta) - this medication is not chemo but being given because you have had chemo. It is usually given about 48 hours after the completion of chemotherapy. This medication works by boosting your bone marrow's supply of white blood cells. White blood cells are what help our bodies fight against infection. The medication is given in the form of a subcutaneous injection. It is given in the fatty tissue of your abdomen or in the skin in the back of your arm. It is a short needle. The major side effect of this medication is bone or muscle pain. The drug of choice to relieve or lessen the pain is Aleve or Ibuprofen. If a physician has ever told you not to take Aleve or Ibuprofen - then don't take it. You should then take Tylenol/acetaminophen.Take either medication as the bottle directs you to. The level of pain you experience as a result of this injection can range  from none, to mild or moderate, or severe. Please let us know if you develop moderate or severe bone pain. You can take Claritin 10 mg over the counter for a few days after receiving neulasta to help with the bone aches and pains.   Cyclophosphamide (Generic Name) Other Names: Cytoxan, Neosar   About This Drug Cyclophosphamide is a drug used to treat cancer. It is given in the vein (IV) or by mouth.  Takes 30 minutes for this drug to infuse.   Possible Side Effects (More Common)  Nausea and throwing up (vomiting). These symptoms may  happen within a few hours after your treatment and may last up to 72 hours. Medicines are available to stop or lessen these side effects.  Bone marrow depression. This is a decrease in the number of white blood cells, red blood cells, and platelets. This may raise your risk of infection, make you tired and weak (fatigue), and raise your risk of bleeding.  Hair loss: You may notice hair getting thin. Some patients lose their hair. Hair loss is often complete scalp hair loss and can involve loss of eyebrows, eyelashes, and pubic hair. You may notice this a few days or weeks after treatment has started. Most often hair loss is temporary; your hair should grow back when treatment is done.  Decreased appetite (decreased hunger)  Blurred vision  Soreness of the mouth and throat. You may have red areas, white patches, or sores that hurt.  Effects on the bladder. This drug may cause irritation and bleeding in the bladder. You may have blood in your urine. To help stop this, you will get extra fluids to help you pass more urine. You may get a drug called mesna, which helps to decrease irritation and bleeding. You may also get a medicine to help you pass more urine. You may have a catheter (tube) placed in your bladder so that your bladder will be washed with this drug.   Possible Side Effects (Less Common)  Darkening of the skin or nails  Metallic taste in the mouth  Changes in lung tissue may happen with large amounts of this drug. These changes may not last forever, and your lung tissue may go back to normal. Sometimes these changes may not be seen for many years. You may get a cough or have trouble catching your breath.   Allergic Reactions   Serious allergic reactions including anaphylaxis are rare. While you are getting this drug in your vein (IV), tell your nurse right away if you have any of these symptoms of an allergic reaction:  Trouble catching your breath  Feeling like your tongue or throat are  swelling  Feeling your heart beat quickly or in a not normal way (palpitations)  Feeling dizzy or lightheaded  Flushing, itching, rash, and/or hives   Treating Side Effects  Drink 6-8 cups of fluids each day unless your doctor has told you to limit your fluid intake due to some other health problem. A cup is 8 ounces of fluid. If you throw up or have loose bowel movements you should drink more fluids so that you do not become dehydrated (lack water in the body due to losing too much fluid).  Ask your doctor or nurse about medicine that is available to help stop or lessen nausea or throwing up.  Mouth care is very important. Your mouth care should consist of routine, gentle cleaning of your teeth or dentures and rinsing your mouth with a mixture of 1/2  teaspoon of salt in 8 ounces of water or  teaspoon of baking soda in 8 ounces of water. This should be done at least after each meal and at bedtime.  If you have mouth sores, avoid mouthwash that has alcohol. Also avoid alcohol and smoking because they can bother your mouth and throat.  Talk with your nurse about getting a wig before you lose your hair. Also, call the [employer] at 800-ACS-2345 to find out information about the " Look Good.Marland KitchenMarland KitchenFeel Better" program close to where you live. It is a free program where women undergoing chemotherapy learn about wigs, turbans and scarves as well as makeup techniques and skin and nail care.   Important Information  Whenever you tell a doctor or nurse your health history, always tell them that you have received cyclophosphamide in the past.  If you take this drug by mouth swallow the medicine whole. Do not chew, break or crush it.  You can take the medicine with or without food. If you have nausea, take it with food. Do not take the pills at bedtime.   Food and Drug Interactions There are no known interactions of cyclophosphamide with food. This drug may interact with other medicines. Tell your  doctor and pharmacist about all the medicines and dietary supplements (vitamins, minerals, herbs and others) that you are taking at this time. The safety and use of dietary supplements and alternative diets are often not known. Using these might affect your cancer or interfere with your treatment. Until more is known, you should not use dietary supplements or alternative diets without your cancer doctor's help.   When to Call the Doctor   Call your doctor or nurse right away if you have any of these symptoms:    Fever of 100.5 F (38 C) or higher  Chills  Bleeding or bruising that is not normal  Blurred vision or other changes in eyesight  Pain when passing urine; blood in urine  Pain in your lower back or side  Wheezing or trouble breathing  Swelling of legs, ankles, or feet  Feeling dizzy or lightheaded  Feeling confused or agitated  Signs of liver problems: dark urine, pale bowel movements, bad stomach pain, feeling very tired and weak, unusual itching, or yellowing of the eyes or skin  Unusual thirst or passing urine often  Nausea that stops you from eating or drinking  Throwing up more than 3 times a day  Call your doctor or nurse as soon as possible if any of these symptoms happen:  Pain in your mouth or throat that makes it hard to eat or drink  Nausea not relieved by prescribed medicines   Sexual Problems and Reproductive Concerns    Infertility warning: Sexual problems and reproduction concerns may happen. In both men and women, this drug may affect your ability to have children. This cannot be determined before your treatment. Talk with your doctor or nurse if you plan to have children. Ask for information on sperm or egg banking.  In men, this drug may interfere with your ability to make sperm, but it should not change your ability to have sexual relations.  In women, menstrual bleeding may become irregular or stop while you are getting this drug. Do not assume that you cannot  become pregnant if you do not have a menstrual period.  Women may go through signs of menopause (change of life) like vaginal dryness or itching. Vaginal lubricants can be used to lessen vaginal dryness,  itching, and pain during sexual relations.  Genetic counseling is available for you to talk about the effects of this drug therapy on future pregnancies. Also, a genetic counselor can look at the possible risk of problems in the unborn baby due to this medicine if an exposure happens during pregnancy.  Pregnancy warning: This drug may have harmful effects on the unborn child, so effective methods of birth control should be used during your cancer treatment.  Breast feeding warning: Women should not breast feed during treatment because this drug could enter the breast milk and badly harm a breast feeding baby   Doxorubicin (Generic Name) Other Names: Adriamycin, hydroxyl daunorubicin   About This Drug Doxorubicin is a drug used to treat cancer. This drug is given in the vein (IV).  This drug is an IV push over about 10 minutes.     Possible Side Effects (More Common)  Bone marrow depression. This is a decrease in the number of white blood cells, red blood cells, and platelets. This may raise your risk of infection, make you tired and weak (fatigue), and raise your risk of bleeding.  Hair loss: Hair loss is often complete scalp hair loss and can involve loss of eyebrows, eyelashes, and pubic hair. You may notice this a few days or weeks after treatment has started. Most often hair loss is temporary; your hair should grow back when treatment is done.  Nausea and throwing up (vomiting). These symptoms may happen within a few hours after your treatment and may last up to 24 hours. Medicines are available to stop or lessen these side effects.  Soreness of the mouth and throat. You may have red areas, white patches, or sores that hurt.  Change in the color of your urine to pink or red. This color change  will go away in one to two days.  Effects on the heart: This drug can weaken the heart and lower heart function. Your heart function will be checked as needed. You may have trouble catching your breath, mainly during activities. You may also have trouble breathing while lying down, and have swelling in your ankles.  Sensitivity to light (photosensitivity). Photosensitivity means that you may become more sensitive to the effects of the sun, sun lamps, and tanning beds. Your eyes may water more, mostly in bright light.  Metallic taste in the mouth: This may change the taste of food and drinks  Decreased appetite (decreased hunger)  Darkening of the skin or nails  Weakness that interferes with your daily activities   Possible Side Effects (Less Common)  Skin and tissue irritation may involve redness, pain, warmth, or swelling at the IV site. This happens if the drug leaks out of the vein and into nearby tissue.  Changes in your liver function. Your doctor will check your liver function as needed.  This drug may cause an increased risk of developing a second cancer   Allergic Reaction Serious allergic reactions, including anaphylaxis are rare. While you are getting this drug in your vein (IV), tell your nurse right away if you have any of these symptoms of an allergic reaction:  Trouble catching your breath  Feeling like your tongue or throat are swelling  Feeling your heart beat quickly or in a not normal way (palpitations)  Feeling dizzy or lightheaded  Flushing, itching, rash, and/or hives   Treating Side Effects  Drink 6-8 cups of fluids every day unless your doctor has told you to limit your fluid intake due to  some other health problem. A cup is 8 ounces of fluid. If you vomit or have diarrhea, you should drink more fluids so that you do not become dehydrated (lack water in the body due to losing too much fluid).  Ask your doctor or nurse about medicine that is available to help stop or  lessen nausea, throwing up, and/or loose bowel movements  Wear dark sunglasses and use sunscreen with SPF 30 or higher when you are outdoors even for a short time. Cover up when you are out in the sun. Wear wide-brimmed hats, long-sleeved shirts, and pants. Keep your neck, chest, and back covered.  Mouth care is very important. Your mouth care should consist of routine, gentle cleaning of your teeth or dentures and rinsing your mouth with a mixture of 1/2 teaspoon of salt in 8 ounces of water or  teaspoon of baking soda in 8 ounces of water. This should be done at least after each meal and at bedtime.  If you have mouth sores, avoid mouthwash that has alcohol. Avoid alcohol and smoking because they can bother your mouth and throat.  Talk with your nurse about getting a wig before you lose your hair. Also, call the [employer] at 800-ACS-2345 to find out information about the "Look Good, Feel Better" program close to where you live. It is a free program where women getting chemotherapy can learn about wigs, turbans and scarves as well as makeup techniques and skin and nail care.  While you are getting this drug, please tell your nurse right away if you have any pain, redness, or swelling at the site of the IV infusion.   Food and Drug Interactions There are no known interactions of doxorubicin with food. This drug may interact with other medicines. Tell your doctor and pharmacist about all the medicines and dietary supplements (vitamins, minerals, herbs and others) that you are taking at this time. The safety and use of dietary supplements and alternative diets are often not known. Using these might affect your cancer or interfere with your treatment. Until more is known, you should not use dietary supplements or alternative diets without your cancer doctor's help.   When to Call the Doctor Call your doctor or nurse right away if you have any of these symptoms:  Fever of 100.5 F (38 C) or  above  Chills  Easy bruising or bleeding  Wheezing or trouble breathing  Rash or itching  Feeling dizzy or lightheaded  Feeling that your heart is beating in a fast or not normal way (palpitations)  Loose bowel movements (diarrhea) more than 4 times a day or diarrhea with weakness or feeling lightheaded  Nausea that stops you from eating or drinking  Throwing up more than 3 times a day  Signs of liver problems: dark urine, pale bowel movements, bad stomach pain, feeling very tired and weak, unusual itching, or yellowing of the eyes or skin,  During the IV infusion, if you have pain, redness, or swelling at the site of the IV infusion, please tell your nurse right away   Call your doctor or nurse as soon as possible if any of these symptoms happen:  Decreased urine  Pain in your mouth or throat that makes it hard to eat or drink  Nausea and throwing up that is not relieved by prescribed medicines  Rash that is not relieved by prescribed medicines  Swelling of legs, ankles, or feet  Weight gain of 5 pounds in one week (fluid  retention)  Lasting loss of appetite or rapid weight loss of five pounds in a week  Fatigue that interferes with your daily activities  Extreme weakness that interferes with normal activities   Sexual Problems and Reproduction Concerns  Infertility warning: Sexual problems and reproduction concerns may happen. In both men and women, this drug may affect your ability to have children. This cannot be determined before your treatment. Talk with your doctor or nurse if you plan to have children. Ask for information on sperm or egg banking.  In men, this drug may interfere with your ability to make sperm, but it should not change your ability to have sexual relations.  In women, menstrual bleeding may become irregular or stop while you are getting this drug. Do not assume that you cannot become pregnant if you do not have a menstrual period.     Paclitaxel (Taxol)    About This Drug Paclitaxel is a drug used to treat cancer. It is given in the vein (IV).  This will take 1 hour to infuse.  This first infusion will take longer to infuse because it is increased slowly to monitor for reactions.  The nurse will be in the room with you for the first 15 minutes of the first infusion.   Possible Side Effects  Hair loss. Hair loss is often temporary, although with certain medicine, hair loss can sometimes be permanent. Hair loss may happen suddenly or gradually. If you lose hair, you may lose it from your head, face, armpits, pubic area, chest, and/or legs. You may also notice your hair getting thin.  Swelling of your legs, ankles and/or feet (edema)  Flushing  Nausea and throwing up (vomiting)  Loose bowel movements (diarrhea)  Bone marrow depression. This is a decrease in the number of white blood cells, red blood cells, and platelets. This may raise your risk of infection, make you tired and weak (fatigue), and raise your risk of bleeding.  Effects on the nerves are called peripheral neuropathy. You may feel numbness, tingling, or pain in your hands and feet. It may be hard for you to button your clothes, open jars, or walk as usual. The effect on the nerves may get worse with more doses of the drug. These effects get better in some people after the drug is stopped but it does not get better in all people.  Changes in your liver function  Bone, joint and muscle pain  Abnormal EKG  Allergic reaction: Allergic reactions, including anaphylaxis are rare but may happen in some patients. Signs of allergic reaction to this drug may be swelling of the face, feeling like your tongue or throat are swelling, trouble breathing, rash, itching, fever, chills, feeling dizzy, and/or feeling that your heart is beating in a fast or not normal way. If this happens, do not take another dose of this drug. You should get urgent medical treatment.  Infection  Changes in your kidney  function. Note: Each of the side effects above was reported in 20% or greater of patients treated with paclitaxel. Not all possible side effects are included above.   Warnings and Precautions  Severe allergic reactions  Severe bone marrow depression   Treating Side Effects  To help with hair loss, wash with a mild shampoo and avoid washing your hair every day.  Avoid rubbing your scalp, instead, pat your hair or scalp dry  Avoid coloring your hair  Limit your use of hair spray, electric curlers, blow dryers, and curling irons.  If you are interested in getting a wig, talk to your nurse. You can also call the [employer] at 800-ACS-2345 to find out information about the "Look Good, Feel Better" program close to where you live. It is a free program where women getting chemotherapy can learn about wigs, turbans and scarves as well as makeup techniques and skin and nail care.  Ask your doctor or nurse about medicines that are available to help stop or lessen diarrhea and/or nausea.  To help with nausea and vomiting, eat small, frequent meals instead of three large meals a day. Choose foods and drinks that are at room temperature. Ask your nurse or doctor about other helpful tips and medicine that is available to help or stop lessen these symptoms.  If you get diarrhea, eat low-fiber foods that are high in protein and calories and avoid foods that can irritate your digestive tracts or lead to cramping. Ask your nurse or doctor about medicine that can lessen or stop your diarrhea.  Mouth care is very important. Your mouth care should consist of routine, gentle cleaning of your teeth or dentures and rinsing your mouth with a mixture of 1/2 teaspoon of salt in 8 ounces of water or  teaspoon of baking soda in 8 ounces of water. This should be done at least after each meal and at bedtime.  If you have mouth sores, avoid mouthwash that has alcohol. Also avoid alcohol and smoking because they can  bother your mouth and throat.  Drink plenty of fluids (a minimum of eight glasses per day is recommended).  Take your temperature as your doctor or nurse tells you, and whenever you feel like you may have a fever.  Talk to your doctor or nurse about precautions you can take to avoid infections and bleeding.  Be careful when cooking, walking, and handling sharp objects and hot liquids.   Food and Drug Interactions  There are no known interactions of paclitaxel with food.  This drug may interact with other medicines. Tell your doctor and pharmacist about all the medicines and dietary supplements (vitamins, minerals, herbs and others) that you are taking at this time.  The safety and use of dietary supplements and alternative diets are often not known. Using these might affect your cancer or interfere with your treatment. Until more is known, you should not use dietary supplements or alternative diets without your cancer doctor's help.   When to Call the Doctor Call your doctor or nurse if you have any of the following symptoms and/or any new or unusual symptoms:  Fever of 100.5 F (38 C) or above  Chills  Redness, pain, warmth, or swelling at the IV site during the infusion  Signs of allergic reaction: swelling of the face, feeling like your tongue or throat are swelling, trouble breathing, rash, itching, fever, chills, feeling dizzy, and/or feeling that your heart is beating in a fast or not normal way  Feeling that your heart is beating in a fast or not normal way (palpitations)  Weight gain of 5 pounds in one week (fluid retention)  Decreased urine or very dark urine  Signs of liver problems: dark urine, pale bowel movements, bad stomach pain, feeling very tired and weak, unusual itching, or yellowing of the eyes or skin  Heavy menstrual period that lasts longer than normal  Easy bruising or bleeding  Nausea that stops you from eating or drinking, and/or that is not relieved by prescribed  medicines.  Loose bowel movements (  diarrhea) more than 4 times a day or diarrhea with weakness or lightheadedness  Pain in your mouth or throat that makes it hard to eat or drink  Lasting loss of appetite or rapid weight loss of five pounds in a week  Signs of peripheral neuropathy: numbness, tingling, or decreased feeling in fingers or toes; trouble walking or changes in the way you walk; or feeling clumsy when buttoning clothes, opening jars, or other routine activities  Joint and muscle pain that is not relieved by prescribed medicines  Extreme fatigue that interferes with normal activities  While you are getting this drug, please tell your nurse right away if you have any pain, redness, or swelling at the site of the IV infusion.  If you think you are pregnant.   Reproduction Warnings    Pregnancy warning: This drug may have harmful effects on the unborn child, it is recommended that effective methods of birth control should be used during your cancer treatment. Let your doctor know right away if you think you may be pregnant.    Breast feeding warning: Women should not breast feed during treatment because this drug could enter the breastmilk and cause harm to a breast feeding baby.   SELF CARE ACTIVITIES WHILE ON CHEMOTHERAPY/IMMUNOTHERAPY:  Hydration Increase your fluid intake and drink at least 64 ounces (2 liters) of water/decaffeinated beverages per day after treatment. You can still have your cup of coffee or soda but these beverages do not count as part of the 64 ounces that you need to drink daily. Limit alcohol intake.  Medications Continue taking your normal prescription medication as prescribed.  If you start any new herbal or new supplements please let us know first to make sure it is safe.  Mouth Care Have teeth cleaned professionally before starting treatment. Keep dentures and partial plates clean. Use soft toothbrush and do not use mouthwashes that contain alcohol.  Biotene is a good mouthwash that is available at most pharmacies or may be ordered by calling [phone]. Use warm salt water gargles (1 teaspoon salt per 1 quart warm water) before and after meals and at bedtime. If you are still having problems with your mouth or sores in your mouth please call the clinic. If you need dental work, please let the doctor know before you go for your appointment so that we can coordinate the best possible time for you in regards to your chemo regimen. You need to also let your dentist know that you are actively taking chemo. We may need to do labs prior to your dental appointment.  Skin Care Always use sunscreen that has not expired and with SPF (Sun Protection Factor) of 50 or higher. Wear hats to protect your head from the sun. Remember to use sunscreen on your hands, ears, face, & feet.  Use good moisturizing lotions such as udder cream, eucerin, or even Vaseline. Some chemotherapies can cause dry skin, color changes in your skin and nails.    Avoid long, hot showers or baths. Use gentle, fragrance-free soaps and laundry detergent. Use moisturizers, preferably creams or ointments rather than lotions because the thicker consistency is better at preventing skin dehydration. Apply the cream or ointment within 15 minutes of showering. Reapply moisturizer at night, and moisturize your hands every time after you wash them.   Infection Prevention Please wash your hands for at least 30 seconds using warm soapy water. Handwashing is the #1 way to prevent the spread of germs. Stay away from sick  people or people who are getting over a cold. If you develop respiratory systems such as green/yellow mucus production or productive cough or persistent cough let us know and we will see if you need an antibiotic. It is a good idea to keep a pair of gloves on when going into grocery stores/[employer] to decrease your risk of coming into contact with germs on the carts, etc. Carry alcohol  hand gel with you at all times and use it frequently if out in public. If your temperature reaches 100.5 or higher please call the clinic and let us know.  If it is after hours or on the weekend please go to the ER if your temperature is over 100.4.  Please have your own personal thermometer at home to use.    Sex and bodily fluids If you are going to have sex, a condom must be used to protect the person that isn't taking immunotherapy. For a few days after treatment, immunotherapy can be excreted through your bodily fluids.  When using the toilet please close the lid and flush the toilet twice.  Do this for a few day after you have had immunotherapy.   Contraception It is not known for sure whether or not immunotherapy drugs can be passed on through semen or secretions from the vagina. Because of this some doctors advise people to use a barrier method if you have sex during treatment. This applies to vaginal, anal or oral sex.  Generally, doctors advise a barrier method only for the time you are actually having the treatment and for about a week after your treatment.  Advice like this can be worrying, but this does not mean that you have to avoid being intimate with your partner. You can still have close contact with your partner and continue to enjoy sex.  Animals If you have cats or birds we ask that you not change the litter or change the cage.  Please have someone else do this for you while you are on immunotherapy.   Food Safety During and After Cancer Treatment Food safety is important for people both during and after cancer treatment. Cancer and cancer treatments, such as chemotherapy, radiation therapy, and stem cell/bone marrow transplantation, often weaken the immune system. This makes it harder for your body to protect itself from foodborne illness, also called food poisoning. Foodborne illness is caused by eating food that contains harmful bacteria, parasites, or viruses.  Foods to  avoid Some foods have a higher risk of becoming tainted with bacteria. These include: Unwashed fresh fruit and vegetables, especially leafy vegetables that can hide dirt and other contaminants Raw sprouts, such as alfalfa sprouts Raw or undercooked beef, especially ground beef, or other raw or undercooked meat and poultry Fatty, fried, or spicy foods immediately before or after treatment.  These can sit heavy on your stomach and make you feel nauseous. Raw or undercooked shellfish, such as oysters. Sushi and sashimi, which often contain raw fish.  Unpasteurized beverages, such as unpasteurized fruit juices, raw milk, raw yogurt, or cider Undercooked eggs, such as soft boiled, over easy, and poached; raw, unpasteurized eggs; or foods made with raw egg, such as homemade raw cookie dough and homemade mayonnaise  Simple steps for food safety  Shop smart. Do not buy food stored or displayed in an unclean area. Do not buy bruised or damaged fruits or vegetables. Do not buy cans that have cracks, dents, or bulges. Pick up foods that can spoil at the end of  your shopping trip and store them in a cooler on the way home.  Prepare and clean up foods carefully. Rinse all fresh fruits and vegetables under running water, and dry them with a clean towel or paper towel. Clean the top of cans before opening them. After preparing food, wash your hands for 20 seconds with hot water and soap. Pay special attention to areas between fingers and under nails. Clean your utensils and dishes with hot water and soap. Disinfect your kitchen and cutting boards using 1 teaspoon of liquid, unscented bleach mixed into 1 quart of water.    Dispose of old food. Eat canned and packaged food before its expiration date (the "use by" or "best before" date). Consume refrigerated leftovers within 3 to 4 days. After that time, throw out the food. Even if the food does not smell or look spoiled, it still may be unsafe. Some  bacteria, such as Listeria, can grow even on foods stored in the refrigerator if they are kept for too long.  Take precautions when eating out. At restaurants, avoid buffets and salad bars where food sits out for a long time and comes in contact with many people. Food can become contaminated when someone with a virus, often a norovirus, or another "bug" handles it. Put any leftover food in a "to-go" container yourself, rather than having the server do it. And, refrigerate leftovers as soon as you get home. Choose restaurants that are clean and that are willing to prepare your food as you order it cooked.    SYMPTOMS TO REPORT AS SOON AS POSSIBLE AFTER TREATMENT:  FEVER GREATER THAN 100.4 F CHILLS WITH OR WITHOUT FEVER NAUSEA AND VOMITING THAT IS NOT CONTROLLED WITH YOUR NAUSEA MEDICATION UNUSUAL SHORTNESS OF BREATH UNUSUAL BRUISING OR BLEEDING TENDERNESS IN MOUTH AND THROAT WITH OR WITHOUT PRESENCE OF ULCERS URINARY PROBLEMS BOWEL PROBLEMS UNUSUAL RASH     Wear comfortable clothing and clothing appropriate for easy access to any Portacath or PICC line. Let us know if there is anything that we can do to make your therapy better!   What to do if you need assistance after hours or on the weekends: CALL [phone].  HOLD on the line, do not hang up.  You will hear multiple messages but at the end you will be connected with a nurse triage line.  They will contact the doctor if necessary.  Most of the time they will be able to assist you.  Do not call the hospital operator.    I have been informed and understand all of the instructions given to me and have received a copy. I have been instructed to call the clinic [phone] or my family physician as soon as possible for continued medical care, if indicated. I do not have any more questions at this time but understand that I may call the Cancer Center or the Patient Navigator at [phone] during office hours should I have questions  or need assistance in obtaining follow-up care.

## 2023-02-15 NOTE — Progress Notes (Signed)
The following biosimilar Fulphila ([id number]) has been selected for use in this patient.    Pharmacist Chemotherapy Monitoring - Initial Assessment    Anticipated start date: 02/22/23   The following has been reviewed per standard work regarding the patient's treatment regimen: The patient's diagnosis, treatment plan and drug doses, and organ/hematologic function Lab orders and baseline tests specific to treatment regimen  The treatment plan start date, drug sequencing, and pre-medications Prior authorization status  Patient's documented medication list, including drug-drug interaction screen and prescriptions for anti-emetics and supportive care specific to the treatment regimen The drug concentrations, fluid compatibility, administration routes, and timing of the medications to be used The patient's access for treatment and lifetime cumulative dose history, if applicable  The patient's medication allergies and previous infusion related reactions, if applicable   Changes made to treatment plan:  switch to insurance preferred biosimilar  Follow up needed:  N/A   Stephens Shire, [facility], 02/15/2023  2:38 PM

## 2023-02-16 ENCOUNTER — Ambulatory Visit (HOSPITAL_BASED_OUTPATIENT_CLINIC_OR_DEPARTMENT_OTHER): Payer: BC Managed Care – PPO

## 2023-02-16 DIAGNOSIS — Z170 Estrogen receptor positive status [ER+]: Secondary | ICD-10-CM

## 2023-02-16 DIAGNOSIS — C50912 Malignant neoplasm of unspecified site of left female breast: Secondary | ICD-10-CM | POA: Diagnosis not present

## 2023-02-16 DIAGNOSIS — I5030 Unspecified diastolic (congestive) heart failure: Secondary | ICD-10-CM

## 2023-02-16 DIAGNOSIS — C50412 Malignant neoplasm of upper-outer quadrant of left female breast: Secondary | ICD-10-CM

## 2023-02-16 LAB — ECHOCARDIOGRAM COMPLETE
Area-P 1/2: 4.21 cm2
S' Lateral: 3.3 cm

## 2023-02-17 ENCOUNTER — Inpatient Hospital Stay: Payer: BC Managed Care – PPO | Admitting: Licensed Clinical Social Worker

## 2023-02-17 ENCOUNTER — Encounter (HOSPITAL_COMMUNITY): Payer: Self-pay | Admitting: General Surgery

## 2023-02-17 DIAGNOSIS — C50412 Malignant neoplasm of upper-outer quadrant of left female breast: Secondary | ICD-10-CM

## 2023-02-17 NOTE — Progress Notes (Signed)
[facility] Clinical Social Work  Initial Assessment   Judy Patton is a 52 y.o. year old female contacted by phone. Clinical Social Work was referred by medical provider for assessment of psychosocial needs.   SDOH (Social Determinants of Health) assessments performed: Yes   SDOH Screenings   Food Insecurity: No Food Insecurity (12/23/2022)  Housing: Low Risk  (12/23/2022)  Transportation Needs: No Transportation Needs (12/23/2022)  Utilities: Not At Risk (12/23/2022)  Depression (PHQ2-9): Low Risk  (12/23/2022)  Tobacco Use: Low Risk  (02/17/2023)     Distress Screen completed: No     No data to display            Family/Social Information:  Housing Arrangement: patient lives with her husband Judy Patton and their 75 y/o son.  Family members/support persons in your life? Pt has a 2nd son who resides locally as well as friends who are able to provide assistance if needed while pt undergoes treatment.  Pt's spouse restores cars and has a flexible schedule to accommodate transportation and care needs. Transportation concerns: no  Employment: Working full time for [employer] as a Geophysicist/field seismologist.  Pt states she has short term disability benefits and if she needs additional time her colleagues can donate some of their vacation hours to her which she believes will be enough to get through treatment.  Income source: Employment Financial concerns:  No concerns at present, but aware of [facility] should concerns arise. Type of concern: None Food access concerns: no Religious or spiritual practice: Yes-[employer] Currently in place:  none  Coping/ Adjustment to diagnosis: Patient understands treatment plan and what happens next? yes Concerns about diagnosis and/or treatment: Body image and Overwhelmed by information Patient reported stressors: Anxiety/ nervousness and Adjusting to my illness Hopes and/or priorities: Pt's priority is to start treatment w/ the hope of positive  results Patient enjoys time with family/ friends Current coping skills/ strengths: Motivation for treatment/growth , Physical Health , and Supportive family/friends     SUMMARY: Current SDOH Barriers:  No barriers identified at this time.  Clinical Social Work Clinical Goal(s):  No clinical social work goals at this time  Interventions: Discussed common feeling and emotions when being diagnosed with cancer, and the importance of support during treatment Informed patient of the support team roles and support services at [facility] Provided CSW contact information and encouraged patient to call with any questions or concerns Provided patient with information about programs for free head coverings via email as well as support groups.  Pt informed about the [employer] and will apply if financial concerns arise.   Follow Up Plan: Patient will contact CSW with any support or resource needs Patient verbalizes understanding of plan: Yes    Rachel Moulds, LCSW

## 2023-02-19 ENCOUNTER — Other Ambulatory Visit: Payer: Self-pay

## 2023-02-21 NOTE — Progress Notes (Signed)
[facility] [address], [place] [address]    Clinic Day:  02/22/2023  Referring physician: [name], PA-C  Patient Care Team: [name], PA-C as PCP - General (Physician Assistant) Judy Massed, MD as Medical Oncologist (Medical Oncology)   ASSESSMENT & PLAN:   Assessment: 1.  Stage I (T1c N1 M0, G2, ER/PR+, HER2-) left breast UOQ IDC: - Screening mammogram (12/01/2022): Possible distortion in the left breast.  Right breast has no findings suspicious for malignancy. - Left breast diagnostic mammogram/US (12/14/2022): Irregular hypoechoic mass at the 1 o'clock position of the left breast, 4 cm from nipple, measuring 1 x 0.7 x 0.9 cm.  No pathologic lymphadenopathy in the left axilla. - Left breast biopsy (12/16/2022): Invasive ductal carcinoma, grade 2, ER/PR 100%, Ki-67 1%, HER2 0 by IHC. - Left breast lumpectomy and SLNB on 01/10/2023. - Pathology: 1.4 x 1.1 x 1.1 cm moderately differentiated IDC, grade 2, margins negative.  1 sentinel lymph node with metastatic carcinoma (1.1 mm).  1 lymph node with micrometastasis (1 mm).  3 other sentinel lymph nodes negative for invasive carcinoma.  PT1 cpN1a. - Oncotype DX recurrence score 14. - CT CAP (02/08/2023): Left Staebler 1.4 cm sclerotic lesion indeterminate.  No evidence of metastatic disease.  Right nephrolithiasis. - PET scan (02/10/2023): Postop changes in the left breast and axilla.  No uptake in the left established lesion. - 2D echo (02/16/2023): LVEF 55 to 60%.   2.  Social/family history: - She lives at home with her husband.  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  No prior history of smoking. - Paternal grandmother had breast cancer.  Brother had kidney cancer.  Father had prostate cancer.   3.  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 -  Heterozygosity for factor V Leiden mutation.  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  PT mutation negative.  Protein C, protein S, AT III normal. - She has been on Xarelto since then.  4.  Bone health: - DEXA scan (01/04/2023): T-score -0.3, normal.   Plan: 1.  Stage IB (T1c N1 M0, G2, ER/PR+, HER2-) left breast UOQ IDC: - She has port placed. - Left lumpectomy site is no longer putting out discharge. - Reviewed labs today: Normal LFTs, creatinine and electrolytes.  CBC was normal. - Discussed findings on the CT scan and PET scan images with the patient and her husband. - Discussed chemotherapy side effects including fatigue, nausea/vomiting, mucositis, musculoskeletal pains from growth factor injection, cytopenias and increased risk for infections among others. - She will proceed with first cycle of dose dense AC today.  She will call us if she has any problems.  Otherwise we will see her back prior to cycle 2.   2.  Nausea prophylaxis: - As there is interaction with Compazine and Requip, we have sent prescription for Phenergan.  She also has Zofran to be taken as needed.  Orders Placed This Encounter  Procedures   Magnesium    Standing Status:   Future    Standing Expiration Date:   05/02/2024   CBC with Differential    Standing Status:   Future    Standing Expiration Date:   05/02/2024   Comprehensive metabolic panel    Standing Status:   Future    Standing Expiration Date:   05/02/2024   Magnesium    Standing Status:  Future    Standing Expiration Date:   05/09/2024   CBC with Differential    Standing Status:   Future    Standing Expiration Date:   05/09/2024   Comprehensive metabolic panel    Standing Status:   Future    Standing Expiration Date:   05/09/2024   Magnesium    Standing Status:   Future    Standing Expiration Date:   05/16/2024   CBC with Differential    Standing Status:   Future    Standing Expiration Date:   05/16/2024   Comprehensive metabolic panel    Standing Status:   Future     Standing Expiration Date:   05/16/2024   Magnesium    Standing Status:   Future    Standing Expiration Date:   05/23/2024   CBC with Differential    Standing Status:   Future    Standing Expiration Date:   05/23/2024   Comprehensive metabolic panel    Standing Status:   Future    Standing Expiration Date:   05/23/2024   Magnesium    Standing Status:   Future    Standing Expiration Date:   05/30/2024   CBC with Differential    Standing Status:   Future    Standing Expiration Date:   05/30/2024   Comprehensive metabolic panel    Standing Status:   Future    Standing Expiration Date:   05/30/2024   Magnesium    Standing Status:   Future    Standing Expiration Date:   06/06/2024   CBC with Differential    Standing Status:   Future    Standing Expiration Date:   06/06/2024   Comprehensive metabolic panel    Standing Status:   Future    Standing Expiration Date:   06/06/2024   Magnesium    Standing Status:   Future    Standing Expiration Date:   06/13/2024   CBC with Differential    Standing Status:   Future    Standing Expiration Date:   06/13/2024   Comprehensive metabolic panel    Standing Status:   Future    Standing Expiration Date:   06/13/2024      I,Katie Daubenspeck,acting as a scribe for Judy Massed, MD.,have documented all relevant documentation on the behalf of Judy Massed, MD,as directed by  Judy Massed, MD while in the presence of Judy Massed, MD.   I, Judy Massed MD, have reviewed the above documentation for accuracy and completeness, and I agree with the above.   Judy Massed, MD   4/23/20249:16 AM  CHIEF COMPLAINT:   Diagnosis: left breast cancer    Cancer Staging  Breast cancer of upper-outer quadrant of left female breast Staging form: Breast, AJCC 8th Edition - Clinical stage from 12/23/2022: Stage IB (cT1c, cN1(sn), cM0, G2, ER+, PR+, HER2-) - Unsigned    Prior Therapy: Lumpectomy and SLNB   Current Therapy:   Dose dense AC followed by paclitaxel    HISTORY OF PRESENT ILLNESS:   Oncology History  Breast cancer of upper-outer quadrant of left female breast  12/23/2022 Initial Diagnosis   Breast cancer of upper-outer quadrant of left female breast   02/22/2023 -  Chemotherapy   Patient is on Treatment Plan : BREAST ADJUVANT DOSE DENSE AC q14d / PACLitaxel q7d        INTERVAL HISTORY:   Semira is a 52 y.o. female presenting to clinic today for follow up of left breast cancer. She was last  seen by me on 02/03/23.  Since her last visit, she underwent staging CT C/A/P on 02/08/23 showing: indeterminate 1.4 cm sclerotic left acetabular lesion; otherwise no evidence of metastatic disease. She underwent further evaluation of the lesion with PET scan on 02/10/23 showing: no accentuated metabolic activity to lesion of concern in left acetabulum. Also noted were postoperative findings in left breast and axilla with no specific indicators of active malignancy.  She also had port placed on 02/11/23.  Today, she states that she is doing well overall. Her appetite level is at 100%. Her energy level is at 80%.  PAST MEDICAL HISTORY:   Past Medical History: Past Medical History:  Diagnosis Date   Anxiety    Chronic headaches    Depression    Factor V Leiden mutation    Fatigue 02/18/2015   History of kidney stones    Hypothyroidism    MRSA (methicillin resistant Staphylococcus aureus)    Pain of tooth socket 03/14/2015   Pancreatitis    PONV (postoperative nausea and vomiting)    Port-A-Cath in place 02/14/2023   Pulmonary embolus    Restless leg syndrome    Shingles rash 03/14/2015   Shoulder injury    Stress    family   Weight gain 02/18/2015    Surgical History: Past Surgical History:  Procedure Laterality Date   BREAST BIOPSY Left 12/16/2022   [place] LT BREAST BX W LOC DEV 1ST LESION IMG BX SPEC US GUIDE 12/16/2022 [facility]   BREAST BIOPSY Left 01/04/2023   [place] LT RADIO FREQUENCY TAG LOC US  GUIDE 01/04/2023 Edwin Cap, MD [facility]   COLONOSCOPY  03/25/2004   ZOX:WRUE canal hemorrhoids, otherwise normal rectum,   DILITATION & CURRETTAGE/HYSTROSCOPY WITH NOVASURE ABLATION N/A 11/08/2017   Procedure: DILATATION & CURETTAGE/HYSTEROSCOPY WITH NOVASURE ENDOMETRIAL ABLATION;  Surgeon: Tilda Burrow, MD;  Location: [facility];  Service: Gynecology;  Laterality: N/A;   ESOPHAGOGASTRODUODENOSCOPY  03/25/2004   AVW:UJWJXB esophagus, small hiatal hernia, otherwise normal stomach   kidney stones  05/2014,11/2014   LAPAROSCOPIC BILATERAL SALPINGECTOMY Bilateral 11/08/2017   Procedure: LAPAROSCOPIC BILATERAL SALPINGECTOMY;  Surgeon: Tilda Burrow, MD;  Location: [facility];  Service: Gynecology;  Laterality: Bilateral;   LITHOTRIPSY  2008   PARTIAL MASTECTOMY WITH AXILLARY SENTINEL LYMPH NODE BIOPSY Left 01/10/2023   Procedure: PARTIAL MASTECTOMY WITH AXILLARY SENTINEL LYMPH NODE BIOPSY AND RADIOFREQUENCY TAG PLACEMENT;  Surgeon: Lucretia Roers, MD;  Location: [facility];  Service: General;  Laterality: Left;   PORTACATH PLACEMENT Right 02/11/2023   Procedure: INSERTION PORT-A-CATH;  Surgeon: Lucretia Roers, MD;  Location: [facility];  Service: General;  Laterality: Right;    Social History: Social History   Socioeconomic History   Marital status: Married    Spouse name: Not on file   Number of children: Not on file   Years of education: Not on file   Highest education level: Not on file  Occupational History   Not on file  Tobacco Use   Smoking status: Never   Smokeless tobacco: Never  Vaping Use   Vaping Use: Never used  Substance and Sexual Activity   Alcohol use: Yes    Comment: occ   Drug use: No   Sexual activity: Not Currently    Birth control/protection: Surgical  Other Topics Concern   Not on file  Social History Narrative   Not on file   Social Determinants of Health   Financial Resource Strain: Not on file  Food Insecurity: No Food Insecurity (12/23/2022)  Hunger Vital Sign    Worried About Running Out of Food in the Last Year: Never true    Ran Out of Food in the Last Year: Never true  Transportation Needs: No Transportation Needs (12/23/2022)   PRAPARE - Administrator, Civil Service (Medical): No    Lack of Transportation (Non-Medical): No  Physical Activity: Not on file  Stress: Not on file  Social Connections: Not on file  Intimate Partner Violence: Not At Risk (12/23/2022)   Humiliation, Afraid, Rape, and Kick questionnaire    Fear of Current or Ex-Partner: No    Emotionally Abused: No    Physically Abused: No    Sexually Abused: No    Family History: Family History  Problem Relation Age of Onset   Stroke Mother    CAD Mother    Prostate cancer Father 82   Kidney cancer Brother        dx 29s   Factor V Leiden deficiency Brother    Fibromyalgia Brother    Breast cancer Paternal Grandmother        dx >50   Lung cancer Paternal Grandfather     Current Medications:  Current Outpatient Medications:    acetaminophen (TYLENOL) 500 MG tablet, Take 1,000 mg by mouth every 6 (six) hours as needed for moderate pain or headache., Disp: , Rfl:    CYCLOPHOSPHAMIDE IV, Inject into the vein every 14 (fourteen) days., Disp: , Rfl:    DOXOrubicin HCl (ADRIAMYCIN IV), Inject into the vein every 14 (fourteen) days., Disp: , Rfl:    ibuprofen (ADVIL,MOTRIN) 200 MG tablet, Take 400 mg by mouth every 6 (six) hours as needed for headache or moderate pain., Disp: , Rfl:    levothyroxine (SYNTHROID, LEVOTHROID) 88 MCG tablet, TAKE 88 MCG BY MOUTH IN THE MORNING, Disp: 90 tablet, Rfl: 3   lidocaine-prilocaine (EMLA) cream, Apply a quarter sized amount to port a cath site and cover with plastic wrap 1 hour prior to infusion appointments, Disp: 30 g, Rfl: 3   loratadine (CLARITIN) 10 MG tablet, Take 10 mg by mouth daily., Disp: , Rfl:    ondansetron (ZOFRAN) 4 MG tablet, Take 1 tablet (4 mg total) by mouth every 8 (eight) hours as needed.,  Disp: 30 tablet, Rfl: 1   oxyCODONE (ROXICODONE) 5 MG immediate release tablet, Take 1 tablet (5 mg total) by mouth every 4 (four) hours as needed for severe pain or breakthrough pain., Disp: 5 tablet, Rfl: 0   [START ON 02/24/2023] Pegfilgrastim-cbqv (UDENYCA [place]), Inject into the skin every 14 (fourteen) days., Disp: , Rfl:    Polyethylene Glycol 400 (VISINE DRY EYE RELIEF OP), Place 1 drop into both eyes daily as needed (dry eye)., Disp: , Rfl:    promethazine (PHENERGAN) 25 MG tablet, Take 1 tablet (25 mg total) by mouth every 6 (six) hours as needed for nausea or vomiting., Disp: 30 tablet, Rfl: 3   rOPINIRole (REQUIP) 2 MG tablet, TAKE ONE TABLET BY MOUTH AT BEDTIME, Disp: 30 tablet, Rfl: 9   TRINTELLIX 20 MG TABS tablet, Take 20 mg by mouth daily., Disp: , Rfl:    trolamine salicylate (ASPERCREME) 10 % cream, Apply 1 application topically as needed for muscle pain., Disp: , Rfl:    XARELTO 20 MG TABS tablet, , Disp: , Rfl: 2   enoxaparin (LOVENOX) 40 MG/0.4ML injection, Inject 0.4 mLs (40 mg total) into the skin daily for 2 days., Disp: 0.8 mL, Rfl: 0 No current facility-administered medications for this  visit.  Facility-Administered Medications Ordered in Other Visits:    cyclophosphamide (CYTOXAN) 1,200 mg in sodium chloride 0.9 % 250 mL chemo infusion, 600 mg/m2 (Treatment Plan Recorded), Intravenous, Once, Judy Massed, MD   dexamethasone (DECADRON) 10 mg in sodium chloride 0.9 % 50 mL IVPB, 10 mg, Intravenous, Once, Judy Massed, MD   DOXOrubicin (ADRIAMYCIN) chemo injection 120 mg, 60 mg/m2 (Treatment Plan Recorded), Intravenous, Once, Judy Massed, MD   fosaprepitant (EMEND) 150 mg in sodium chloride 0.9 % 145 mL IVPB, 150 mg, Intravenous, Once, Judy Massed, MD   heparin lock flush 100 unit/mL, 500 Units, Intracatheter, Once PRN, Judy Massed, MD   palonosetron (ALOXI) injection 0.25 mg, 0.25 mg, Intravenous, Once, Judy Massed, MD    sodium chloride flush (NS) 0.9 % injection 10 mL, 10 mL, Intracatheter, PRN, Judy Massed, MD   Allergies: No Known Allergies  REVIEW OF SYSTEMS:   Review of Systems  Constitutional:  Negative for chills, fatigue and fever.  HENT:   Negative for lump/mass, mouth sores, nosebleeds, sore throat and trouble swallowing.   Eyes:  Negative for eye problems.  Respiratory:  Negative for cough and shortness of breath.   Cardiovascular:  Negative for chest pain, leg swelling and palpitations.  Gastrointestinal:  Negative for abdominal pain, constipation, diarrhea, nausea and vomiting.  Genitourinary:  Negative for bladder incontinence, difficulty urinating, dysuria, frequency, hematuria and nocturia.   Musculoskeletal:  Negative for arthralgias, back pain, flank pain, myalgias and neck pain.  Skin:  Negative for itching and rash.  Neurological:  Negative for dizziness, headaches and numbness.  Hematological:  Does not bruise/bleed easily.  Psychiatric/Behavioral:  Negative for depression, sleep disturbance and suicidal ideas. The patient is nervous/anxious.   All other systems reviewed and are negative.    VITALS:   There were no vitals taken for this visit.  Wt Readings from Last 3 Encounters:  02/22/23 196 lb 12.8 oz (89.3 kg)  02/09/23 195 lb (88.5 kg)  02/08/23 194 lb 3.2 oz (88.1 kg)    There is no height or weight on file to calculate BMI.  Performance status (ECOG): I had a1 - Symptomatic but completely ambulatory  PHYSICAL EXAM:   Physical Exam Vitals and nursing note reviewed. Exam conducted with a chaperone present.  Constitutional:      Appearance: Normal appearance.  Cardiovascular:     Rate and Rhythm: Normal rate and regular rhythm.     Pulses: Normal pulses.     Heart sounds: Normal heart sounds.  Pulmonary:     Effort: Pulmonary effort is normal.     Breath sounds: Normal breath sounds.  Abdominal:     Palpations: Abdomen is soft. There is no  hepatomegaly, splenomegaly or mass.     Tenderness: There is no abdominal tenderness.  Musculoskeletal:     Right lower leg: No edema.     Left lower leg: No edema.  Lymphadenopathy:     Cervical: No cervical adenopathy.     Right cervical: No superficial, deep or posterior cervical adenopathy.    Left cervical: No superficial, deep or posterior cervical adenopathy.     Upper Body:     Right upper body: No supraclavicular or axillary adenopathy.     Left upper body: No supraclavicular or axillary adenopathy.  Neurological:     General: No focal deficit present.     Mental Status: She is alert and oriented to person, place, and time.  Psychiatric:        Mood and Affect: Mood  normal.        Behavior: Behavior normal.     LABS:      Latest Ref Rng & Units 02/22/2023    8:07 AM 02/03/2023    8:37 AM 11/02/2017    2:53 PM  CBC  WBC 4.0 - 10.5 K/uL 9.5  9.3  12.0   Hemoglobin 12.0 - 15.0 g/dL 16.1  09.6  04.5   Hematocrit 36.0 - 46.0 % 39.8  43.4  41.6   Platelets 150 - 400 K/uL 240  266  291       Latest Ref Rng & Units 02/22/2023    8:07 AM 02/03/2023    8:37 AM 11/02/2017    2:53 PM  CMP  Glucose 70 - 99 mg/dL 409  98  811   BUN 6 - 20 mg/dL 14  13  13    Creatinine 0.44 - 1.00 mg/dL 9.14  7.82  9.56   Sodium 135 - 145 mmol/L 137  137  139   Potassium 3.5 - 5.1 mmol/L 3.5  4.2  3.8   Chloride 98 - 111 mmol/L 103  102  105   CO2 22 - 32 mmol/L 24  27  24    Calcium 8.9 - 10.3 mg/dL 8.8  8.7  8.8   Total Protein 6.5 - 8.1 g/dL 6.9  7.2    Total Bilirubin 0.3 - 1.2 mg/dL 0.6  0.4    Alkaline Phos 38 - 126 U/L 108  110    AST 15 - 41 U/L 22  19    ALT 0 - 44 U/L 17  19       No results found for: "CEA1", "CEA" / No results found for: "CEA1", "CEA" No results found for: "PSA1" No results found for: "OZH086" No results found for: "CAN125"  No results found for: "TOTALPROTELP", "ALBUMINELP", "A1GS", "A2GS", "BETS", "BETA2SER", "GAMS", "MSPIKE", "SPEI" Lab Results  Component  Value Date   FERRITIN 110 11/13/2013   FERRITIN 81 08/07/2013   No results found for: "LDH"   STUDIES:   ECHOCARDIOGRAM COMPLETE  Result Date: 02/16/2023    ECHOCARDIOGRAM REPORT   Patient Name:   Judy Patton  Date of Exam: 02/16/2023 Medical Rec #:  [id number]     Height:       65.0 in Accession #:    [id number]    Weight:       195.0 lb Date of Birth:  Feb 17, 1971      BSA:          1.957 m Patient Age:    51 years      BP:           121/61 mmHg Patient Gender: F             HR:           68 bpm. Exam Location:  [address] Procedure: 2D Echo, Cardiac Doppler, Color Doppler, 3D Echo and Strain Analysis Indications:    Chemo Z09;  History:        Patient has no prior history of Echocardiogram examinations.                 Breast Cancer.  Sonographer:    Thurman Coyer RDCS Referring Phys: [id number] [facility] IMPRESSIONS  1. Left ventricular ejection fraction, by estimation, is 55 to 60%. The left ventricle has normal function. The left ventricle has no regional wall motion abnormalities. Left ventricular diastolic parameters are consistent with Grade II diastolic dysfunction (pseudonormalization).  The average left ventricular global longitudinal strain is -21.3 %. The global longitudinal strain is normal.  2. Right ventricular systolic function is normal. The right ventricular size is normal. There is normal pulmonary artery systolic pressure. The estimated right ventricular systolic pressure is 23.5 mmHg.  3. The mitral valve is normal in structure. No evidence of mitral valve regurgitation. No evidence of mitral stenosis.  4. The aortic valve is tricuspid. Aortic valve regurgitation is not visualized. No aortic stenosis is present.  5. The inferior vena cava is normal in size with <50% respiratory variability, suggesting right atrial pressure of 8 mmHg. FINDINGS  Left Ventricle: Left ventricular ejection fraction, by estimation, is 55 to 60%. The left ventricle has normal function. The left  ventricle has no regional wall motion abnormalities. The average left ventricular global longitudinal strain is -21.3 %. The global longitudinal strain is normal. The left ventricular internal cavity size was normal in size. There is no left ventricular hypertrophy. Left ventricular diastolic parameters are consistent with Grade II diastolic dysfunction (pseudonormalization). Right Ventricle: The right ventricular size is normal. No increase in right ventricular wall thickness. Right ventricular systolic function is normal. There is normal pulmonary artery systolic pressure. The tricuspid regurgitant velocity is 1.97 m/s, and  with an assumed right atrial pressure of 8 mmHg, the estimated right ventricular systolic pressure is 23.5 mmHg. Left Atrium: Left atrial size was normal in size. Right Atrium: Right atrial size was normal in size. Pericardium: There is no evidence of pericardial effusion. Mitral Valve: The mitral valve is normal in structure. No evidence of mitral valve regurgitation. No evidence of mitral valve stenosis. Tricuspid Valve: The tricuspid valve is normal in structure. Tricuspid valve regurgitation is trivial. Aortic Valve: The aortic valve is tricuspid. Aortic valve regurgitation is not visualized. No aortic stenosis is present. Pulmonic Valve: The pulmonic valve was normal in structure. Pulmonic valve regurgitation is not visualized. Aorta: The aortic root is normal in size and structure. Venous: The inferior vena cava is normal in size with less than 50% respiratory variability, suggesting right atrial pressure of 8 mmHg. IAS/Shunts: No atrial level shunt detected by color flow Doppler.  LEFT VENTRICLE PLAX 2D LVIDd:         5.10 cm   Diastology LVIDs:         3.30 cm   LV e' medial:    5.87 cm/s LV PW:         0.90 cm   LV E/e' medial:  15.6 LV IVS:        0.80 cm   LV e' lateral:   9.25 cm/s LVOT diam:     2.20 cm   LV E/e' lateral: 9.9 LV SV:         65 LV SV Index:   33        2D  Longitudinal Strain LVOT Area:     3.80 cm  2D Strain GLS Avg:     -21.3 %                           3D Volume EF:                          3D EF:        56 %  LV EDV:       120 ml                          LV ESV:       53 ml                          LV SV:        67 ml RIGHT VENTRICLE RV Basal diam:  3.70 cm RV Mid diam:    3.50 cm RV S prime:     11.10 cm/s TAPSE (M-mode): 2.4 cm LEFT ATRIUM             Index        RIGHT ATRIUM           Index LA diam:        3.10 cm 1.58 cm/m   RA Area:     16.10 cm LA Vol (A2C):   24.7 ml 12.62 ml/m  RA Volume:   44.10 ml  22.53 ml/m LA Vol (A4C):   34.5 ml 17.63 ml/m LA Biplane Vol: 29.7 ml 15.18 ml/m  AORTIC VALVE LVOT Vmax:   86.50 cm/s LVOT Vmean:  57.400 cm/s LVOT VTI:    0.172 m  AORTA Ao Root diam: 3.10 cm Ao Asc diam:  3.40 cm MITRAL VALVE               TRICUSPID VALVE MV Area (PHT): 4.21 cm    TR Peak grad:   15.5 mmHg MV Decel Time: 180 msec    TR Vmax:        197.00 cm/s MV E velocity: 91.30 cm/s MV A velocity: 78.40 cm/s  SHUNTS MV E/A ratio:  1.16        Systemic VTI:  0.17 m                            Systemic Diam: 2.20 cm Dalton McleanMD Electronically signed by Wilfred Lacy Signature Date/Time: 02/16/2023/4:38:02 PM    Final    NM PET Image Initial (PI) Skull Base To Thigh  Result Date: 02/14/2023 CLINICAL DATA:  Initial treatment strategy for breast cancer. EXAM: NUCLEAR MEDICINE PET SKULL BASE TO THIGH TECHNIQUE: 10.9 mCi F-18 FDG was injected intravenously. Full-ring PET imaging was performed from the skull base to thigh after the radiotracer. CT data was obtained and used for attenuation correction and anatomic localization. Fasting blood glucose: 76 mg/dl COMPARISON:  CT scan 09/81/1914 FINDINGS: Mediastinal blood pool activity: SUV max 2.8 Liver activity: SUV max NA NECK: Symmetric accentuated activity along the palatine tonsils, maximum SUV 8.0 on the left and 7.4 on the right, likely physiologic. Incidental CT  findings: None. CHEST: Postoperative site in the left breast with maximum SUV in the subcutaneous tissues of 4.2. Mild stranding along the site of prior axillary dissection, in the vicinity of the axillary clips the maximum SUV is 1.4. No specific indicators of active malignancy in the chest. Incidental CT findings: Mild cardiomegaly. Mild scarring or atelectasis in the posterior basal segment left lower lobe. ABDOMEN/PELVIS: No significant abnormal hypermetabolic activity in this region. Incidental CT findings: Nonobstructive right nephrolithiasis. Dependent density in the gallbladder could be from sludge or gallstones. Mild abdominal aortic atherosclerotic vascular calcification. SKELETON: The rim sclerotic lesion of concern in the left acetabulum demonstrates no accentuated metabolic activity and is likely an enchondroma or similar benign osseous  lesion. No hypermetabolic bony lesions suspicious for osseous metastatic disease identified. Incidental CT findings: None. IMPRESSION: 1. Postoperative findings in the left breast and left axilla, with maximum SUV in the subcutaneous tissues of the left breast at the site of prior surgery, maximum SUV is 4.2. No specific indicators of active malignancy. 2. 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 3. Symmetric accentuated activity along the palatine tonsils, likely physiologic. 4. Mild cardiomegaly. 5. Nonobstructive right nephrolithiasis. 6. Dependent density in the gallbladder could be from sludge or gallstones. 7. Aortic atherosclerosis. Aortic Atherosclerosis (ICD10-I70.0). Electronically Signed   By: Gaylyn Rong M.D.   On: 02/14/2023 08:27   DG Chest Port 1 View  Result Date: 02/11/2023 CLINICAL DATA:  Port-A-Cath. EXAM: PORTABLE CHEST 1 VIEW COMPARISON:  CT, 02/08/2023. FINDINGS: Right anterior chest wall power Port-A-Cath has  its tip projecting in the lower superior vena cava. No pneumothorax.  Clear lungs.  No pleural effusion. Cardiac silhouette normal in size. Normal mediastinal and hilar contours. Skeletal structures are grossly intact. IMPRESSION: 1. Well-positioned right anterior chest wall Port-A-Cath, tip in the lower superior vena cava. 2. No pneumothorax.  No acute cardiopulmonary disease. Electronically Signed   By: Amie Portland M.D.   On: 02/11/2023 11:31   DG C-Arm 1-60 Min-No Report  Result Date: 02/11/2023 Fluoroscopy was utilized by the requesting physician.  No radiographic interpretation.   CT CHEST ABDOMEN PELVIS W CONTRAST  Result Date: 02/08/2023 CLINICAL DATA:  Left lumpectomy and node biopsy in 2024 for breast cancer. * Tracking Code: BO * EXAM: CT CHEST, ABDOMEN, AND PELVIS WITH CONTRAST TECHNIQUE: Multidetector CT imaging of the chest, abdomen and pelvis was performed following the standard protocol during bolus administration of intravenous contrast. RADIATION DOSE REDUCTION: This exam was performed according to the departmental dose-optimization program which includes automated exposure control, adjustment of the mA and/or kV according to patient size and/or use of iterative reconstruction technique. CONTRAST:  OMNIPAQUE IOHEXOL 300 MG/ML  SOLN COMPARISON:  None Available. FINDINGS: CT CHEST FINDINGS Cardiovascular: Normal caliber of the aorta and branch vessels. Normal heart size, without pericardial effusion. No central pulmonary embolism, on this non-dedicated study. Mediastinum/Nodes: No supraclavicular adenopathy. Left axillary node dissection. No axillary or subpectoral adenopathy. No mediastinal or hilar adenopathy. Lungs/Pleura: No pleural fluid. Minimal motion degradation in the lower chest. No suspicious pulmonary nodule or mass. Musculoskeletal: Left-sided lumpectomy with mild overlying skin thickening. Remote anterior right second rib fracture. CT ABDOMEN PELVIS FINDINGS Hepatobiliary:  Normal liver. Normal gallbladder, without biliary ductal dilatation. Pancreas: Normal, without mass or ductal dilatation. Spleen: Normal in size, without focal abnormality. Adrenals/Urinary Tract: Normal adrenal glands. Right renal collecting system calculi of up to 3 mm. Normal left kidney. No hydronephrosis. Normal urinary bladder. Stomach/Bowel: Normal stomach, without wall thickening. Colonic stool burden suggests constipation. Normal terminal ileum. Appendix not visualized. Normal small bowel. Vascular/Lymphatic: Aortic atherosclerosis. No abdominopelvic adenopathy. Reproductive: Normal uterus and adnexa. Other: No significant free fluid. No free intraperitoneal air. No evidence of omental or peritoneal disease. Musculoskeletal: Left acetabular sclerotic lesion of 1.4 cm on 103/2. Degenerative disc disease at the lumbosacral junction. IMPRESSION: 1. Left acetabular 1.4 cm sclerotic lesion is indeterminate. If no prior imaging for comparison, consider further evaluation with PET. 2. Otherwise, no evidence of metastatic disease, status post left-sided lumpectomy and axillary node dissection. 3. Right nephrolithiasis 4.  Aortic Atherosclerosis (ICD10-I70.0). Electronically Signed   By:  Jeronimo Greaves M.D.   On: 02/08/2023 09:08

## 2023-02-22 ENCOUNTER — Inpatient Hospital Stay: Payer: BC Managed Care – PPO

## 2023-02-22 ENCOUNTER — Inpatient Hospital Stay (HOSPITAL_BASED_OUTPATIENT_CLINIC_OR_DEPARTMENT_OTHER): Payer: BC Managed Care – PPO | Admitting: Hematology

## 2023-02-22 VITALS — BP 117/72 | HR 69 | Temp 98.5°F | Resp 18

## 2023-02-22 DIAGNOSIS — Z95828 Presence of other vascular implants and grafts: Secondary | ICD-10-CM | POA: Diagnosis not present

## 2023-02-22 DIAGNOSIS — C50412 Malignant neoplasm of upper-outer quadrant of left female breast: Secondary | ICD-10-CM | POA: Diagnosis not present

## 2023-02-22 DIAGNOSIS — Z170 Estrogen receptor positive status [ER+]: Secondary | ICD-10-CM

## 2023-02-22 DIAGNOSIS — Z5111 Encounter for antineoplastic chemotherapy: Secondary | ICD-10-CM | POA: Diagnosis not present

## 2023-02-22 LAB — CBC WITH DIFFERENTIAL/PLATELET
Abs Immature Granulocytes: 0.03 10*3/uL (ref 0.00–0.07)
Basophils Absolute: 0.1 10*3/uL (ref 0.0–0.1)
Basophils Relative: 1 %
Eosinophils Absolute: 0.3 10*3/uL (ref 0.0–0.5)
Eosinophils Relative: 3 %
HCT: 39.8 % (ref 36.0–46.0)
Hemoglobin: 13.3 g/dL (ref 12.0–15.0)
Immature Granulocytes: 0 %
Lymphocytes Relative: 22 %
Lymphs Abs: 2.1 10*3/uL (ref 0.7–4.0)
MCH: 31 pg (ref 26.0–34.0)
MCHC: 33.4 g/dL (ref 30.0–36.0)
MCV: 92.8 fL (ref 80.0–100.0)
Monocytes Absolute: 0.8 10*3/uL (ref 0.1–1.0)
Monocytes Relative: 9 %
Neutro Abs: 6.2 10*3/uL (ref 1.7–7.7)
Neutrophils Relative %: 65 %
Platelets: 240 10*3/uL (ref 150–400)
RBC: 4.29 MIL/uL (ref 3.87–5.11)
RDW: 13.2 % (ref 11.5–15.5)
WBC: 9.5 10*3/uL (ref 4.0–10.5)
nRBC: 0 % (ref 0.0–0.2)

## 2023-02-22 LAB — COMPREHENSIVE METABOLIC PANEL
ALT: 17 U/L (ref 0–44)
AST: 22 U/L (ref 15–41)
Albumin: 3.5 g/dL (ref 3.5–5.0)
Alkaline Phosphatase: 108 U/L (ref 38–126)
Anion gap: 10 (ref 5–15)
BUN: 14 mg/dL (ref 6–20)
CO2: 24 mmol/L (ref 22–32)
Calcium: 8.8 mg/dL — ABNORMAL LOW (ref 8.9–10.3)
Chloride: 103 mmol/L (ref 98–111)
Creatinine, Ser: 0.68 mg/dL (ref 0.44–1.00)
GFR, Estimated: >60 mL/min (ref >60)
Glucose, Bld: 107 mg/dL — ABNORMAL HIGH (ref 70–99)
Potassium: 3.5 mmol/L (ref 3.5–5.1)
Sodium: 137 mmol/L (ref 135–145)
Total Bilirubin: 0.6 mg/dL (ref 0.3–1.2)
Total Protein: 6.9 g/dL (ref 6.5–8.1)

## 2023-02-22 LAB — MAGNESIUM: Magnesium: 1.8 mg/dL (ref 1.7–2.4)

## 2023-02-22 MED ORDER — PROMETHAZINE HCL 25 MG PO TABS: 25.0000 mg | ORAL_TABLET | Freq: Four times a day (QID) | ORAL | 3 refills | Status: DC | PRN

## 2023-02-22 MED ORDER — SODIUM CHLORIDE 0.9 % IV SOLN: Freq: Once | INTRAVENOUS | Status: AC

## 2023-02-22 MED ORDER — PALONOSETRON HCL INJECTION 0.25 MG/5ML
0.2500 mg | Freq: Once | INTRAVENOUS | Status: AC
  Administered 2023-02-22: 0.25 mg via INTRAVENOUS
  Filled 2023-02-22: qty 5

## 2023-02-22 MED ORDER — SODIUM CHLORIDE 0.9 % IV SOLN
10.0000 mg | Freq: Once | INTRAVENOUS | Status: AC
  Administered 2023-02-22: 10 mg via INTRAVENOUS
  Filled 2023-02-22: qty 1

## 2023-02-22 MED ORDER — SODIUM CHLORIDE 0.9 % IV SOLN
600.0000 mg/m2 | Freq: Once | INTRAVENOUS | Status: AC
  Administered 2023-02-22: 1200 mg via INTRAVENOUS
  Filled 2023-02-22: qty 60

## 2023-02-22 MED ORDER — SODIUM CHLORIDE 0.9% FLUSH
10.0000 mL | INTRAVENOUS | Status: DC | PRN
  Administered 2023-02-22: 10 mL

## 2023-02-22 MED ORDER — HEPARIN SOD (PORK) LOCK FLUSH 100 UNIT/ML IV SOLN
500.0000 [IU] | Freq: Once | INTRAVENOUS | Status: AC | PRN
  Administered 2023-02-22: 500 [IU]

## 2023-02-22 MED ORDER — SODIUM CHLORIDE 0.9 % IV SOLN
150.0000 mg | Freq: Once | INTRAVENOUS | Status: AC
  Administered 2023-02-22: 150 mg via INTRAVENOUS
  Filled 2023-02-22: qty 150

## 2023-02-22 MED ORDER — DOXORUBICIN HCL CHEMO IV INJECTION 2 MG/ML
60.0000 mg/m2 | Freq: Once | INTRAVENOUS | Status: AC
  Administered 2023-02-22: 120 mg via INTRAVENOUS
  Filled 2023-02-22: qty 60

## 2023-02-22 MED ORDER — SODIUM CHLORIDE 0.9% FLUSH
10.0000 mL | Freq: Once | INTRAVENOUS | Status: AC
  Administered 2023-02-22: 10 mL via INTRAVENOUS

## 2023-02-22 NOTE — Patient Instructions (Signed)
[facility]  Discharge Instructions: Thank you for choosing [facility] to provide your oncology and hematology care.  If you have a lab appointment with the Cancer Center - please note that after April 8th, 2024, all labs will be drawn in the cancer center.  You do not have to check in or register with the main entrance as you have in the past but will complete your check-in in the cancer center.  Wear comfortable clothing and clothing appropriate for easy access to any Portacath or PICC line.   We strive to give you quality time with your provider. You may need to reschedule your appointment if you arrive late (15 or more minutes).  Arriving late affects you and other patients whose appointments are after yours.  Also, if you miss three or more appointments without notifying the office, you may be dismissed from the clinic at the provider's discretion.      For prescription refill requests, have your pharmacy contact our office and allow 72 hours for refills to be completed.    Today you received the following chemotherapy and/or immunotherapy agents Adriamycin and Cytoxan   To help prevent nausea and vomiting after your treatment, we encourage you to take your nausea medication as directed.  Doxorubicin Injection What is this medication? DOXORUBICIN (dox oh ROO bi sin) treats some types of cancer. It works by slowing down the growth of cancer cells. This medicine may be used for other purposes; ask your health care provider or pharmacist if you have questions. COMMON BRAND NAME(S): Adriamycin, Adriamycin PFS, Adriamycin RDF, Rubex What should I tell my care team before I take this medication? They need to know if you have any of these conditions: Heart disease History of low blood cell levels caused by a medication Liver disease Recent or ongoing radiation An unusual or allergic reaction to doxorubicin, other medications, foods, dyes, or  preservatives If you or your partner are pregnant or trying to get pregnant Breast-feeding How should I use this medication? This medication is injected into a vein. It is given by your care team in a hospital or clinic setting. Talk to your care team about the use of this medication in children. Special care may be needed. Overdosage: If you think you have taken too much of this medicine contact a poison control center or emergency room at once. NOTE: This medicine is only for you. Do not share this medicine with others. What if I miss a dose? Keep appointments for follow-up doses. It is important not to miss your dose. Call your care team if you are unable to keep an appointment. What may interact with this medication? 6-mercaptopurine Paclitaxel Phenytoin St. John's wort Trastuzumab Verapamil This list may not describe all possible interactions. Give your health care provider a list of all the medicines, herbs, non-prescription drugs, or dietary supplements you use. Also tell them if you smoke, drink alcohol, or use illegal drugs. Some items may interact with your medicine. What should I watch for while using this medication? Your condition will be monitored carefully while you are receiving this medication. You may need blood work while taking this medication. This medication may make you feel generally unwell. This is not uncommon as chemotherapy can affect healthy cells as well as cancer cells. Report any side effects. Continue your course of treatment even though you feel ill unless your care team tells you to stop. There is a maximum amount of this medication you should  receive throughout your life. The amount depends on the medical condition being treated and your overall health. Your care team will watch how much of this medication you receive. Tell your care team if you have taken this medication before. Your urine may turn red for a few days after your dose. This is not blood. If  your urine is dark or brown, call your care team. In some cases, you may be given additional medications to help with side effects. Follow all directions for their use. This medication may increase your risk of getting an infection. Call your care team for advice if you get a fever, chills, sore throat, or other symptoms of a cold or flu. Do not treat yourself. Try to avoid being around people who are sick. This medication may increase your risk to bruise or bleed. Call your care team if you notice any unusual bleeding. Talk to your care team about your risk of cancer. You may be more at risk for certain types of cancers if you take this medication. You should make sure that you get enough Coenzyme Q10 while you are taking this medication. Discuss the foods you eat and the vitamins you take with your care team. Talk to your care team if you or your partner may be pregnant. Serious birth defects can occur if you take this medication during pregnancy and for 6 months after the last dose. Contraception is recommended while taking this medication and for 6 months after the last dose. Your care team can help you find the option that works for you. If your partner can get pregnant, use a condom while taking this medication and for 6 months after the last dose. Do not breastfeed while taking this medication. This medication may cause infertility. Talk to your care team if you are concerned about your fertility. What side effects may I notice from receiving this medication? Side effects that you should report to your care team as soon as possible: Allergic reactions--skin rash, itching, hives, swelling of the face, lips, tongue, or throat Heart failure--shortness of breath, swelling of the ankles, feet, or hands, sudden weight gain, unusual weakness or fatigue Heart rhythm changes--fast or irregular heartbeat, dizziness, feeling faint or lightheaded, chest pain, trouble breathing Infection--fever, chills,  cough, sore throat, wounds that don't heal, pain or trouble when passing urine, general feeling of discomfort or being unwell Low red blood cell level--unusual weakness or fatigue, dizziness, headache, trouble breathing Painful swelling, warmth, or redness of the skin, blisters or sores at the infusion site Unusual bruising or bleeding Side effects that usually do not require medical attention (report to your care team if they continue or are bothersome): Diarrhea Hair loss Nausea Pain, redness, or swelling with sores inside the mouth or throat Red urine This list may not describe all possible side effects. Call your doctor for medical advice about side effects. You may report side effects to FDA at 1-800-FDA-1088. Where should I keep my medication? This medication is given in a hospital or clinic. It will not be stored at home. NOTE: This sheet is a summary. It may not cover all possible information. If you have questions about this medicine, talk to your doctor, pharmacist, or health care provider.  2023 Elsevier/Gold Standard (2022-02-24 00:00:00)  Cyclophosphamide Injection What is this medication? CYCLOPHOSPHAMIDE (sye kloe FOSS fa mide) treats some types of cancer. It works by slowing down the growth of cancer cells. This medicine may be used for other purposes; ask your health care  provider or pharmacist if you have questions. COMMON BRAND NAME(S): Cyclophosphamide, Cytoxan, Neosar What should I tell my care team before I take this medication? They need to know if you have any of these conditions: Heart disease Irregular heartbeat or rhythm Infection Kidney problems Liver disease Low blood cell levels (white cells, platelets, or red blood cells) Lung disease Previous radiation Trouble passing urine An unusual or allergic reaction to cyclophosphamide, other medications, foods, dyes, or preservatives Pregnant or trying to get pregnant Breast-feeding How should I use this  medication? This medication is injected into a vein. It is given by your care team in a hospital or clinic setting. Talk to your care team about the use of this medication in children. Special care may be needed. Overdosage: If you think you have taken too much of this medicine contact a poison control center or emergency room at once. NOTE: This medicine is only for you. Do not share this medicine with others. What if I miss a dose? Keep appointments for follow-up doses. It is important not to miss your dose. Call your care team if you are unable to keep an appointment. What may interact with this medication? Amphotericin B Amiodarone Azathioprine Certain antivirals for HIV or hepatitis Certain medications for blood pressure, such as enalapril, lisinopril, quinapril Cyclosporine Diuretics Etanercept Indomethacin Medications that relax muscles Metronidazole Natalizumab Tamoxifen Warfarin This list may not describe all possible interactions. Give your health care provider a list of all the medicines, herbs, non-prescription drugs, or dietary supplements you use. Also tell them if you smoke, drink alcohol, or use illegal drugs. Some items may interact with your medicine. What should I watch for while using this medication? This medication may make you feel generally unwell. This is not uncommon as chemotherapy can affect healthy cells as well as cancer cells. Report any side effects. Continue your course of treatment even though you feel ill unless your care team tells you to stop. You may need blood work while you are taking this medication. This medication may increase your risk of getting an infection. Call your care team for advice if you get a fever, chills, sore throat, or other symptoms of a cold or flu. Do not treat yourself. Try to avoid being around people who are sick. Avoid taking medications that contain aspirin, acetaminophen, ibuprofen, naproxen, or ketoprofen unless  instructed by your care team. These medications may hide a fever. Be careful brushing or flossing your teeth or using a toothpick because you may get an infection or bleed more easily. If you have any dental work done, tell your dentist you are receiving this medication. Drink water or other fluids as directed. Urinate often, even at night. Some products may contain alcohol. Ask your care team if this medication contains alcohol. Be sure to tell all care teams you are taking this medicine. Certain medicines, like metronidazole and disulfiram, can cause an unpleasant reaction when taken with alcohol. The reaction includes flushing, headache, nausea, vomiting, sweating, and increased thirst. The reaction can last from 30 minutes to several hours. Talk to your care team if you wish to become pregnant or think you might be pregnant. This medication can cause serious birth defects if taken during pregnancy and for 1 year after the last dose. A negative pregnancy test is required before starting this medication. A reliable form of contraception is recommended while taking this medication and for 1 year after the last dose. Talk to your care team about reliable forms of contraception.  Do not father a child while taking this medication and for 4 months after the last dose. Use a condom during this time period. Do not breast-feed while taking this medication or for 1 week after the last dose. This medication may cause infertility. Talk to your care team if you are concerned about your fertility. Talk to your care team about your risk of cancer. You may be more at risk for certain types of cancer if you take this medication. What side effects may I notice from receiving this medication? Side effects that you should report to your care team as soon as possible: Allergic reactions--skin rash, itching, hives, swelling of the face, lips, tongue, or throat Dry cough, shortness of breath or trouble breathing Heart  failure--shortness of breath, swelling of the ankles, feet, or hands, sudden weight gain, unusual weakness or fatigue Heart muscle inflammation--unusual weakness or fatigue, shortness of breath, chest pain, fast or irregular heartbeat, dizziness, swelling of the ankles, feet, or hands Heart rhythm changes--fast or irregular heartbeat, dizziness, feeling faint or lightheaded, chest pain, trouble breathing Infection--fever, chills, cough, sore throat, wounds that don't heal, pain or trouble when passing urine, general feeling of discomfort or being unwell Kidney injury--decrease in the amount of urine, swelling of the ankles, hands, or feet Liver injury--right upper belly pain, loss of appetite, nausea, light-colored stool, dark yellow or brown urine, yellowing skin or eyes, unusual weakness or fatigue Low red blood cell level--unusual weakness or fatigue, dizziness, headache, trouble breathing Low sodium level--muscle weakness, fatigue, dizziness, headache, confusion Red or dark brown urine Unusual bruising or bleeding Side effects that usually do not require medical attention (report to your care team if they continue or are bothersome): Hair loss Irregular menstrual cycles or spotting Loss of appetite Nausea Pain, redness, or swelling with sores inside the mouth or throat Vomiting This list may not describe all possible side effects. Call your doctor for medical advice about side effects. You may report side effects to FDA at 1-800-FDA-1088. Where should I keep my medication? This medication is given in a hospital or clinic. It will not be stored at home. NOTE: This sheet is a summary. It may not cover all possible information. If you have questions about this medicine, talk to your doctor, pharmacist, or health care provider.  2023 Elsevier/Gold Standard (2021-12-08 00:00:00)    BELOW ARE SYMPTOMS THAT SHOULD BE REPORTED IMMEDIATELY: *FEVER GREATER THAN 100.4 F (38 C) OR HIGHER *CHILLS  OR SWEATING *NAUSEA AND VOMITING THAT IS NOT CONTROLLED WITH YOUR NAUSEA MEDICATION *UNUSUAL SHORTNESS OF BREATH *UNUSUAL BRUISING OR BLEEDING *URINARY PROBLEMS (pain or burning when urinating, or frequent urination) *BOWEL PROBLEMS (unusual diarrhea, constipation, pain near the anus) TENDERNESS IN MOUTH AND THROAT WITH OR WITHOUT PRESENCE OF ULCERS (sore throat, sores in mouth, or a toothache) UNUSUAL RASH, SWELLING OR PAIN  UNUSUAL VAGINAL DISCHARGE OR ITCHING   Items with * indicate a potential emergency and should be followed up as soon as possible or go to the Emergency Department if any problems should occur.  Please show the CHEMOTHERAPY ALERT CARD or IMMUNOTHERAPY ALERT CARD at check-in to the Emergency Department and triage nurse.  Should you have questions after your visit or need to cancel or reschedule your appointment, please contact [facility] [phone]  and follow the prompts.  Office hours are 8:00 a.m. to 4:30 p.m. Monday - Friday. Please note that voicemails left after 4:00 p.m. may not be returned until the following business day.  We are closed weekends and major holidays. You have access to a nurse at all times for urgent questions. Please call the main number to the clinic [phone] and follow the prompts.  For any non-urgent questions, you may also contact your provider using MyChart. We now offer e-Visits for anyone 60 and older to request care online for non-urgent symptoms. For details visit [email or web address].   Also download the MyChart app! Go to the app store, search "MyChart", open the app, select [place], and log in with your MyChart username and password.

## 2023-02-22 NOTE — Progress Notes (Signed)
Patient has been examined by Dr. Katragadda. Vital signs and labs have been reviewed by MD - ANC, Creatinine, LFTs, hemoglobin, and platelets are within treatment parameters per M.D. - pt may proceed with treatment.  Primary RN and pharmacy notified.  

## 2023-02-22 NOTE — Patient Instructions (Addendum)
[facility] at [facility] Discharge Instructions   You were seen and examined today by Dr. Ellin Saba.  He reviewed the results of your lab work which are normal/stable.   He reviewed the results of your echocardiogram which was normal.   We will proceed with your treatment today.   Return as scheduled.    Thank you for choosing [facility] at [facility] to provide your oncology and hematology care.  To afford each patient quality time with our provider, please arrive at least 15 minutes before your scheduled appointment time.   If you have a lab appointment with the Cancer Center please come in thru the Main Entrance and check in at the main information desk.  You need to re-schedule your appointment should you arrive 10 or more minutes late.  We strive to give you quality time with our providers, and arriving late affects you and other patients whose appointments are after yours.  Also, if you no show three or more times for appointments you may be dismissed from the clinic at the providers discretion.     Again, thank you for choosing [facility].  Our hope is that these requests will decrease the amount of time that you wait before being seen by our physicians.       _____________________________________________________________  Should you have questions after your visit to [facility], please contact our office at [phone] and follow the prompts.  Our office hours are 8:00 a.m. and 4:30 p.m. Monday - Friday.  Please note that voicemails left after 4:00 p.m. may not be returned until the following business day.  We are closed weekends and major holidays.  You do have access to a nurse 24-7, just call the main number to the clinic [phone] and do not press any options, hold on the line and a nurse will answer the phone.    For prescription refill requests, have your pharmacy contact our office and allow 72  hours.    Due to Covid, you will need to wear a mask upon entering the hospital. If you do not have a mask, a mask will be given to you at the Main Entrance upon arrival. For doctor visits, patients may have 1 support person age 26 or older with them. For treatment visits, patients can not have anyone with them due to social distancing guidelines and our immunocompromised population.

## 2023-02-22 NOTE — Progress Notes (Signed)
Patient presents today for D1C1 Doxorubicin and Cytoxan infusion. Patient is in satisfactory condition with no new complaints voiced.  Vital signs are stable.  Labs reviewed by Dr. Ellin Saba during the office visit and all labs are within treatment parameters.  We will proceed with treatment per MD orders.   D1C1 Doxorubicin and Cytoxan given today per MD orders. Tolerated infusion without adverse affects. Vital signs stable. No complaints at this time. Discharged from clinic ambulatory in stable condition. Alert and oriented x 3. F/U with [facility] as scheduled.

## 2023-02-24 ENCOUNTER — Inpatient Hospital Stay: Payer: BC Managed Care – PPO

## 2023-02-24 VITALS — BP 137/72 | HR 71 | Temp 98.5°F | Resp 16

## 2023-02-24 DIAGNOSIS — C50412 Malignant neoplasm of upper-outer quadrant of left female breast: Secondary | ICD-10-CM

## 2023-02-24 DIAGNOSIS — Z170 Estrogen receptor positive status [ER+]: Secondary | ICD-10-CM

## 2023-02-24 DIAGNOSIS — Z5111 Encounter for antineoplastic chemotherapy: Secondary | ICD-10-CM | POA: Diagnosis not present

## 2023-02-24 DIAGNOSIS — Z95828 Presence of other vascular implants and grafts: Secondary | ICD-10-CM

## 2023-02-24 MED ORDER — PEGFILGRASTIM-JMDB 6 MG/0.6ML ~~LOC~~ SOSY
6.0000 mg | PREFILLED_SYRINGE | Freq: Once | SUBCUTANEOUS | Status: AC
  Administered 2023-02-24: 6 mg via SUBCUTANEOUS
  Filled 2023-02-24: qty 0.6

## 2023-02-24 NOTE — Progress Notes (Signed)
24 HOUR CHEMOTHERAPY CALL BACK: PATIENT COMPLAINED OF NAUSEA AND WEAKNESS,  BUT HAS IMPROVED TODAY. PATIENT HAS BEEN TAKING ANTINAUSEA MEDICATION AS SCHEDULED. PATIENT ADVISED TO CONTACT OFFICE WITH ANY QUESTIONS OR CONCERNS.

## 2023-02-24 NOTE — Patient Instructions (Signed)
[facility]  Discharge Instructions: Thank you for choosing [facility] to provide your oncology and hematology care.  If you have a lab appointment with the Cancer Center - please note that after April 8th, 2024, all labs will be drawn in the cancer center.  You do not have to check in or register with the main entrance as you have in the past but will complete your check-in in the cancer center.  Wear comfortable clothing and clothing appropriate for easy access to any Portacath or PICC line.   We strive to give you quality time with your provider. You may need to reschedule your appointment if you arrive late (15 or more minutes).  Arriving late affects you and other patients whose appointments are after yours.  Also, if you miss three or more appointments without notifying the office, you may be dismissed from the clinic at the provider's discretion.      For prescription refill requests, have your pharmacy contact our office and allow 72 hours for refills to be completed.    Today you received the following fulphila, return as scheduled.   To help prevent nausea and vomiting after your treatment, we encourage you to take your nausea medication as directed.  BELOW ARE SYMPTOMS THAT SHOULD BE REPORTED IMMEDIATELY: *FEVER GREATER THAN 100.4 F (38 C) OR HIGHER *CHILLS OR SWEATING *NAUSEA AND VOMITING THAT IS NOT CONTROLLED WITH YOUR NAUSEA MEDICATION *UNUSUAL SHORTNESS OF BREATH *UNUSUAL BRUISING OR BLEEDING *URINARY PROBLEMS (pain or burning when urinating, or frequent urination) *BOWEL PROBLEMS (unusual diarrhea, constipation, pain near the anus) TENDERNESS IN MOUTH AND THROAT WITH OR WITHOUT PRESENCE OF ULCERS (sore throat, sores in mouth, or a toothache) UNUSUAL RASH, SWELLING OR PAIN  UNUSUAL VAGINAL DISCHARGE OR ITCHING   Items with * indicate a potential emergency and should be followed up as soon as possible or go to the Emergency Department if  any problems should occur.  Please show the CHEMOTHERAPY ALERT CARD or IMMUNOTHERAPY ALERT CARD at check-in to the Emergency Department and triage nurse.  Should you have questions after your visit or need to cancel or reschedule your appointment, please contact [facility]ENTER AT [facility] [phone]  and follow the prompts.  Office hours are 8:00 a.m. to 4:30 p.m. Monday - Friday. Please note that voicemails left after 4:00 p.m. may not be returned until the following business day.  We are closed weekends and major holidays. You have access to a nurse at all times for urgent questions. Please call the main number to the clinic [phone] and follow the prompts.  For any non-urgent questions, you may also contact your provider using MyChart. We now offer e-Visits for anyone 29 and older to request care online for non-urgent symptoms. For details visit [email or web address].   Also download the MyChart app! Go to the app store, search "MyChart", open the app, select [place], and log in with your MyChart username and password.

## 2023-02-24 NOTE — Progress Notes (Signed)
Patient tolerated injection with no complaints voiced. Site clean and dry with no bruising or swelling noted at site. See MAR for details. Band aid applied.  Patient stable during and after injection. VSS with discharge and left in satisfactory condition with no s/s of distress noted.  

## 2023-02-26 ENCOUNTER — Other Ambulatory Visit: Payer: Self-pay

## 2023-03-05 ENCOUNTER — Other Ambulatory Visit: Payer: Self-pay

## 2023-03-07 ENCOUNTER — Telehealth: Payer: Self-pay | Admitting: Dietician

## 2023-03-07 ENCOUNTER — Inpatient Hospital Stay: Payer: BC Managed Care – PPO | Attending: Hematology | Admitting: Dietician

## 2023-03-07 DIAGNOSIS — Z803 Family history of malignant neoplasm of breast: Secondary | ICD-10-CM | POA: Insufficient documentation

## 2023-03-07 DIAGNOSIS — Z79899 Other long term (current) drug therapy: Secondary | ICD-10-CM | POA: Insufficient documentation

## 2023-03-07 DIAGNOSIS — Z7901 Long term (current) use of anticoagulants: Secondary | ICD-10-CM | POA: Insufficient documentation

## 2023-03-07 DIAGNOSIS — K219 Gastro-esophageal reflux disease without esophagitis: Secondary | ICD-10-CM | POA: Insufficient documentation

## 2023-03-07 DIAGNOSIS — C50412 Malignant neoplasm of upper-outer quadrant of left female breast: Secondary | ICD-10-CM | POA: Insufficient documentation

## 2023-03-07 DIAGNOSIS — Z86711 Personal history of pulmonary embolism: Secondary | ICD-10-CM | POA: Insufficient documentation

## 2023-03-07 DIAGNOSIS — Z801 Family history of malignant neoplasm of trachea, bronchus and lung: Secondary | ICD-10-CM | POA: Insufficient documentation

## 2023-03-07 DIAGNOSIS — G2581 Restless legs syndrome: Secondary | ICD-10-CM | POA: Insufficient documentation

## 2023-03-07 DIAGNOSIS — Z170 Estrogen receptor positive status [ER+]: Secondary | ICD-10-CM | POA: Insufficient documentation

## 2023-03-07 DIAGNOSIS — Z5111 Encounter for antineoplastic chemotherapy: Secondary | ICD-10-CM | POA: Insufficient documentation

## 2023-03-07 DIAGNOSIS — Z5189 Encounter for other specified aftercare: Secondary | ICD-10-CM | POA: Insufficient documentation

## 2023-03-07 NOTE — Telephone Encounter (Signed)
Nutrition Follow-up:  Patient with left breast cancer. She is currently receiving dose dense AC q14d (start 4/23).  Spoke with patient via telephone. She reports tolerating first chemotherapy well overall. Patient says the first week was "kind of blah" and reports decreased appetite, mild nausea, constipation. Patient reports antiemetics worked well. She has been feeling well this week.    Medications: reviewed   Labs: 4/23 - reviewed   Anthropometrics: Last weight 196 lb 12.8 oz on 4/23  4/15 - 195 lb    NUTRITION DIAGNOSIS: Food and nutrition related knowledge deficit ongoing     INTERVENTION:  Discussed strategies for nausea, foods best tolerated and foods to limit/avoid Take antiemetics as needed. Suggested taking 30 minutes prior to eating    MONITORING, EVALUATION, GOAL: weight trends, intake    NEXT VISIT: To be scheduled as needed

## 2023-03-08 ENCOUNTER — Inpatient Hospital Stay (HOSPITAL_BASED_OUTPATIENT_CLINIC_OR_DEPARTMENT_OTHER): Payer: BC Managed Care – PPO | Admitting: Hematology

## 2023-03-08 ENCOUNTER — Inpatient Hospital Stay: Payer: BC Managed Care – PPO

## 2023-03-08 VITALS — BP 126/79 | HR 72 | Resp 16

## 2023-03-08 DIAGNOSIS — K219 Gastro-esophageal reflux disease without esophagitis: Secondary | ICD-10-CM | POA: Diagnosis not present

## 2023-03-08 DIAGNOSIS — C50412 Malignant neoplasm of upper-outer quadrant of left female breast: Secondary | ICD-10-CM | POA: Diagnosis not present

## 2023-03-08 DIAGNOSIS — Z170 Estrogen receptor positive status [ER+]: Secondary | ICD-10-CM | POA: Diagnosis not present

## 2023-03-08 DIAGNOSIS — Z5189 Encounter for other specified aftercare: Secondary | ICD-10-CM | POA: Diagnosis not present

## 2023-03-08 DIAGNOSIS — Z86711 Personal history of pulmonary embolism: Secondary | ICD-10-CM | POA: Diagnosis not present

## 2023-03-08 DIAGNOSIS — Z95828 Presence of other vascular implants and grafts: Secondary | ICD-10-CM

## 2023-03-08 DIAGNOSIS — Z79899 Other long term (current) drug therapy: Secondary | ICD-10-CM | POA: Diagnosis not present

## 2023-03-08 DIAGNOSIS — Z5111 Encounter for antineoplastic chemotherapy: Secondary | ICD-10-CM | POA: Diagnosis present

## 2023-03-08 DIAGNOSIS — Z803 Family history of malignant neoplasm of breast: Secondary | ICD-10-CM | POA: Diagnosis not present

## 2023-03-08 DIAGNOSIS — Z801 Family history of malignant neoplasm of trachea, bronchus and lung: Secondary | ICD-10-CM | POA: Diagnosis not present

## 2023-03-08 DIAGNOSIS — Z7901 Long term (current) use of anticoagulants: Secondary | ICD-10-CM | POA: Diagnosis not present

## 2023-03-08 DIAGNOSIS — G2581 Restless legs syndrome: Secondary | ICD-10-CM | POA: Diagnosis not present

## 2023-03-08 LAB — CBC WITH DIFFERENTIAL/PLATELET
Abs Immature Granulocytes: 0.3 10*3/uL — ABNORMAL HIGH (ref 0.00–0.07)
Band Neutrophils: 1 %
Basophils Absolute: 0 10*3/uL (ref 0.0–0.1)
Basophils Relative: 0 %
Eosinophils Absolute: 0 10*3/uL (ref 0.0–0.5)
Eosinophils Relative: 0 %
HCT: 40.5 % (ref 36.0–46.0)
Hemoglobin: 13.3 g/dL (ref 12.0–15.0)
Lymphocytes Relative: 22 %
Lymphs Abs: 2.3 10*3/uL (ref 0.7–4.0)
MCH: 30.8 pg (ref 26.0–34.0)
MCHC: 32.8 g/dL (ref 30.0–36.0)
MCV: 93.8 fL (ref 80.0–100.0)
Metamyelocytes Relative: 3 %
Monocytes Absolute: 1 10*3/uL (ref 0.1–1.0)
Monocytes Relative: 10 %
Neutro Abs: 6.7 10*3/uL (ref 1.7–7.7)
Neutrophils Relative %: 64 %
Platelets: 192 10*3/uL (ref 150–400)
RBC: 4.32 MIL/uL (ref 3.87–5.11)
RDW: 12.6 % (ref 11.5–15.5)
WBC: 10.3 10*3/uL (ref 4.0–10.5)
nRBC: 0 % (ref 0.0–0.2)

## 2023-03-08 LAB — COMPREHENSIVE METABOLIC PANEL
ALT: 18 U/L (ref 0–44)
AST: 26 U/L (ref 15–41)
Albumin: 3.6 g/dL (ref 3.5–5.0)
Alkaline Phosphatase: 110 U/L (ref 38–126)
Anion gap: 11 (ref 5–15)
BUN: 14 mg/dL (ref 6–20)
CO2: 24 mmol/L (ref 22–32)
Calcium: 8.8 mg/dL — ABNORMAL LOW (ref 8.9–10.3)
Chloride: 103 mmol/L (ref 98–111)
Creatinine, Ser: 0.66 mg/dL (ref 0.44–1.00)
GFR, Estimated: >60 mL/min (ref >60)
Glucose, Bld: 138 mg/dL — ABNORMAL HIGH (ref 70–99)
Potassium: 3.6 mmol/L (ref 3.5–5.1)
Sodium: 138 mmol/L (ref 135–145)
Total Bilirubin: 0.5 mg/dL (ref 0.3–1.2)
Total Protein: 6.9 g/dL (ref 6.5–8.1)

## 2023-03-08 LAB — MAGNESIUM: Magnesium: 1.8 mg/dL (ref 1.7–2.4)

## 2023-03-08 MED ORDER — SODIUM CHLORIDE 0.9 % IV SOLN
150.0000 mg | Freq: Once | INTRAVENOUS | Status: AC
  Administered 2023-03-08: 150 mg via INTRAVENOUS
  Filled 2023-03-08: qty 5

## 2023-03-08 MED ORDER — PALONOSETRON HCL INJECTION 0.25 MG/5ML
0.2500 mg | Freq: Once | INTRAVENOUS | Status: AC
  Administered 2023-03-08: 0.25 mg via INTRAVENOUS
  Filled 2023-03-08: qty 5

## 2023-03-08 MED ORDER — HEPARIN SOD (PORK) LOCK FLUSH 100 UNIT/ML IV SOLN
500.0000 [IU] | Freq: Once | INTRAVENOUS | Status: AC | PRN
  Administered 2023-03-08: 500 [IU]

## 2023-03-08 MED ORDER — DOXORUBICIN HCL CHEMO IV INJECTION 2 MG/ML
60.0000 mg/m2 | Freq: Once | INTRAVENOUS | Status: AC
  Administered 2023-03-08: 120 mg via INTRAVENOUS
  Filled 2023-03-08: qty 60

## 2023-03-08 MED ORDER — SODIUM CHLORIDE 0.9 % IV SOLN
600.0000 mg/m2 | Freq: Once | INTRAVENOUS | Status: AC
  Administered 2023-03-08: 1200 mg via INTRAVENOUS
  Filled 2023-03-08: qty 60

## 2023-03-08 MED ORDER — SODIUM CHLORIDE 0.9% FLUSH
10.0000 mL | Freq: Once | INTRAVENOUS | Status: AC
  Administered 2023-03-08: 10 mL via INTRAVENOUS

## 2023-03-08 MED ORDER — SODIUM CHLORIDE 0.9% FLUSH
10.0000 mL | INTRAVENOUS | Status: DC | PRN
  Administered 2023-03-08: 10 mL

## 2023-03-08 MED ORDER — SODIUM CHLORIDE 0.9 % IV SOLN: Freq: Once | INTRAVENOUS | Status: AC

## 2023-03-08 MED ORDER — SODIUM CHLORIDE 0.9 % IV SOLN
10.0000 mg | Freq: Once | INTRAVENOUS | Status: AC
  Administered 2023-03-08: 10 mg via INTRAVENOUS
  Filled 2023-03-08: qty 1

## 2023-03-08 NOTE — Patient Instructions (Signed)
[facility]  Discharge Instructions: Thank you for choosing [facility] to provide your oncology and hematology care.  If you have a lab appointment with the Cancer Center - please note that after April 8th, 2024, all labs will be drawn in the cancer center.  You do not have to check in or register with the main entrance as you have in the past but will complete your check-in in the cancer center.  Wear comfortable clothing and clothing appropriate for easy access to any Portacath or PICC line.   We strive to give you quality time with your provider. You may need to reschedule your appointment if you arrive late (15 or more minutes).  Arriving late affects you and other patients whose appointments are after yours.  Also, if you miss three or more appointments without notifying the office, you may be dismissed from the clinic at the provider's discretion.      For prescription refill requests, have your pharmacy contact our office and allow 72 hours for refills to be completed.    Today you received the following chemotherapy and/or immunotherapy agents doxorubicin cytoxan      To help prevent nausea and vomiting after your treatment, we encourage you to take your nausea medication as directed.  BELOW ARE SYMPTOMS THAT SHOULD BE REPORTED IMMEDIATELY: *FEVER GREATER THAN 100.4 F (38 C) OR HIGHER *CHILLS OR SWEATING *NAUSEA AND VOMITING THAT IS NOT CONTROLLED WITH YOUR NAUSEA MEDICATION *UNUSUAL SHORTNESS OF BREATH *UNUSUAL BRUISING OR BLEEDING *URINARY PROBLEMS (pain or burning when urinating, or frequent urination) *BOWEL PROBLEMS (unusual diarrhea, constipation, pain near the anus) TENDERNESS IN MOUTH AND THROAT WITH OR WITHOUT PRESENCE OF ULCERS (sore throat, sores in mouth, or a toothache) UNUSUAL RASH, SWELLING OR PAIN  UNUSUAL VAGINAL DISCHARGE OR ITCHING   Items with * indicate a potential emergency and should be followed up as soon as possible or  go to the Emergency Department if any problems should occur.  Please show the CHEMOTHERAPY ALERT CARD or IMMUNOTHERAPY ALERT CARD at check-in to the Emergency Department and triage nurse.  Should you have questions after your visit or need to cancel or reschedule your appointment, please contact [facility]ENTER AT [facility] [phone]  and follow the prompts.  Office hours are 8:00 a.m. to 4:30 p.m. Monday - Friday. Please note that voicemails left after 4:00 p.m. may not be returned until the following business day.  We are closed weekends and major holidays. You have access to a nurse at all times for urgent questions. Please call the main number to the clinic [phone] and follow the prompts.  For any non-urgent questions, you may also contact your provider using MyChart. We now offer e-Visits for anyone 53 and older to request care online for non-urgent symptoms. For details visit [email or web address].   Also download the MyChart app! Go to the app store, search "MyChart", open the app, select [place], and log in with your MyChart username and password.

## 2023-03-08 NOTE — Progress Notes (Signed)
[facility] [address] [place] [address]    Clinic Day:  03/08/2023  Referring physician: [name], PA-C  Patient Care Team: [name], PA-C as PCP - General (Physician Assistant) [name], MD as Medical Oncologist (Medical Oncology)   ASSESSMENT & PLAN:   Assessment: 1.  Stage I (T1c N1 M0, G2, ER/PR+, HER2-) left breast UOQ IDC: - Screening mammogram (12/01/2022): Possible distortion in the left breast.  Right breast has no findings suspicious for malignancy. - Left breast diagnostic mammogram/US (12/14/2022): Irregular hypoechoic mass at the 1 o'clock position of the left breast, 4 cm from nipple, measuring 1 x 0.7 x 0.9 cm.  No pathologic lymphadenopathy in the left axilla. - Left breast biopsy (12/16/2022): Invasive ductal carcinoma, grade 2, ER/PR 100%, Ki-67 1%, HER2 0 by IHC. - Left breast lumpectomy and SLNB on 01/10/2023. - Pathology: 1.4 x 1.1 x 1.1 cm moderately differentiated IDC, grade 2, margins negative.  1 sentinel lymph node with metastatic carcinoma (1.1 mm).  1 lymph node with micrometastasis (1 mm).  3 other sentinel lymph nodes negative for invasive carcinoma.  PT1 cpN1a. - Oncotype DX recurrence score 14. - CT CAP (02/08/2023): Left Staebler 1.4 cm sclerotic lesion indeterminate.  No evidence of metastatic disease.  Right nephrolithiasis. - PET scan (02/10/2023): Postop changes in the left breast and axilla.  No uptake in the left established lesion. - 2D echo (02/16/2023): LVEF 55 to 60%. - Cycle 1 AC on 02/22/2023   2.  Social/family history: - She lives at home with her husband.  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  No prior history of smoking. - Paternal grandmother had breast cancer.  Brother had kidney cancer.  Father had prostate cancer.   3.  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  Probable infarct in  the left lung base. - Heterozygosity for factor V Leiden mutation.  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  PT mutation negative.  Protein C, protein S, AT III normal. - She has been on Xarelto since then.   4.  Bone health: - DEXA scan (01/04/2023): T-score -0.3, normal.    Plan: 1.  Stage IB (T1c N1 M0, G2, ER/PR+, HER2-) left breast UOQ IDC: - She has tolerated cycle 1 of AC reasonably well.  She had nausea for couple of days but denied any vomiting.  She felt tired during the first week.  It started to come out yesterday. - Reviewed labs today: CBC was normal.  LFTs and creatinine was normal. - She will proceed with cycle 2 today.  RTC 2 weeks for follow-up.   2.  Nausea prophylaxis: - Continue Phenergan as needed.  Use Zofran if Phenergan does not work.  3.  Restless leg syndrome: - She takes 2 mg of Requip at bedtime.  She noticed RLS symptoms gotten worse after the first cycle of chemo. - Recommend increasing Requip to 4 mg at bedtime.  If it helps, will give a new prescription.  4.  Acid reflux: - This is a new symptom since first cycle.  She was told to start Pepcid 20 mg twice daily.    No orders of the defined types were placed in this encounter.     I,Katie Daubenspeck,acting as a Neurosurgeon for [name], MD.,have documented all relevant documentation on the behalf of [name], MD,as directed by  [name], MD while in the presence of [name],  MD.   Margaret Pyle MD, have reviewed the above documentation for accuracy and completeness, and I agree with the above. Was already in the  [place], [place]   5/7/20249:56 AM  CHIEF COMPLAINT:   Diagnosis: left breast cancer    Cancer Staging  Breast cancer of upper-outer quadrant of left female breast [facility]) Staging form: Breast, AJCC 8th Edition - Clinical stage from 12/23/2022: Stage IB (cT1c, cN1(sn), cM0, G2, ER+, PR+, HER2-) - Unsigned    Prior Therapy: Lumpectomy and SLNB    Current Therapy:  Dose dense AC followed by paclitaxel    HISTORY OF PRESENT ILLNESS:   Oncology History  Breast cancer of upper-outer quadrant of left female breast (HCC)  12/23/2022 Initial Diagnosis   Breast cancer of upper-outer quadrant of left female breast   02/22/2023 -  Chemotherapy   Patient is on Treatment Plan : BREAST ADJUVANT DOSE DENSE AC q14d / PACLitaxel q7d        INTERVAL HISTORY:   Kassiah is a 52 y.o. female presenting to clinic today for follow up of left breast cancer. She was last seen by me on 02/22/23.  Today, she states that she is doing well overall. Her appetite level is at 75%. Her energy level is at 75%.  PAST MEDICAL HISTORY:   Past Medical History: Past Medical History:  Diagnosis Date   Anxiety    Chronic headaches    Depression    Factor V Leiden mutation (HCC)    Fatigue 02/18/2015   History of kidney stones    Hypothyroidism    MRSA (methicillin resistant Staphylococcus aureus)    Pain of tooth socket 03/14/2015   Pancreatitis    PONV (postoperative nausea and vomiting)    Port-A-Cath in place 02/14/2023   Pulmonary embolus (HCC)    Restless leg syndrome    Shingles rash 03/14/2015   Shoulder injury    Stress    family   Weight gain 02/18/2015    Surgical History: Past Surgical History:  Procedure Laterality Date   BREAST BIOPSY Left 12/16/2022   [place] LT BREAST BX W LOC DEV 1ST LESION IMG BX SPEC US GUIDE 12/16/2022 [facility]   BREAST BIOPSY Left 01/04/2023   [place] LT RADIO FREQUENCY TAG LOC US GUIDE 01/04/2023 Edwin Cap, MD [facility]   COLONOSCOPY  03/25/2004   ZOX:WRUE canal hemorrhoids, otherwise normal rectum,   DILITATION & CURRETTAGE/HYSTROSCOPY WITH NOVASURE ABLATION N/A 11/08/2017   Procedure: DILATATION & CURETTAGE/HYSTEROSCOPY WITH NOVASURE ENDOMETRIAL ABLATION;  Surgeon: Tilda Burrow, MD;  Location: [facility];  Service: Gynecology;  Laterality: N/A;   ESOPHAGOGASTRODUODENOSCOPY  03/25/2004   AVW:[id number]  esophagus, small hiatal hernia, otherwise normal stomach   kidney stones  05/2014,11/2014   LAPAROSCOPIC BILATERAL SALPINGECTOMY Bilateral 11/08/2017   Procedure: LAPAROSCOPIC BILATERAL SALPINGECTOMY;  Surgeon: Tilda Burrow, MD;  Location: [facility];  Service: Gynecology;  Laterality: Bilateral;   LITHOTRIPSY  2008   PARTIAL MASTECTOMY WITH AXILLARY SENTINEL LYMPH NODE BIOPSY Left 01/10/2023   Procedure: PARTIAL MASTECTOMY WITH AXILLARY SENTINEL LYMPH NODE BIOPSY AND RADIOFREQUENCY TAG PLACEMENT;  Surgeon: Lucretia Roers, MD;  Location: [facility];  Service: General;  Laterality: Left;   PORTACATH PLACEMENT Right 02/11/2023   Procedure: INSERTION PORT-A-CATH;  Surgeon: Lucretia Roers, MD;  Location: [facility];  Service: General;  Laterality: Right;    Social History: Social History   Socioeconomic History   Marital status: Married    Spouse name: Not on file   Number of children: Not on  file   Years of education: Not on file   Highest education level: Not on file  Occupational History   Not on file  Tobacco Use   Smoking status: Never   Smokeless tobacco: Never  Vaping Use   Vaping Use: Never used  Substance and Sexual Activity   Alcohol use: Yes    Comment: occ   Drug use: No   Sexual activity: Not Currently    Birth control/protection: Surgical  Other Topics Concern   Not on file  Social History Narrative   Not on file   Social Determinants of Health   Financial Resource Strain: Not on file  Food Insecurity: No Food Insecurity (12/23/2022)   Hunger Vital Sign    Worried About Running Out of Food in the Last Year: Never true    Ran Out of Food in the Last Year: Never true  Transportation Needs: No Transportation Needs (12/23/2022)   PRAPARE - Transportation    Lack of Transportation (Medical): No    Lack of Transportation (Non-Medical): No  Physical Activity: Not on file  Stress: Not on file  Social Connections: Not on file  Intimate Partner Violence: Not At Risk  (12/23/2022)   Humiliation, Afraid, Rape, and Kick questionnaire    Fear of Current or Ex-Partner: No    Emotionally Abused: No    Physically Abused: No    Sexually Abused: No    Family History: Family History  Problem Relation Age of Onset   Stroke Mother    CAD Mother    Prostate cancer Father 37   Kidney cancer Brother        dx 22s   Factor V Leiden deficiency Brother    Fibromyalgia Brother    Breast cancer Paternal Grandmother        dx >50   Lung cancer Paternal Grandfather     Current Medications:  Current Outpatient Medications:    acetaminophen (TYLENOL) 500 MG tablet, Take 1,000 mg by mouth every 6 (six) hours as needed for moderate pain or headache., Disp: , Rfl:    CYCLOPHOSPHAMIDE IV, Inject into the vein every 14 (fourteen) days., Disp: , Rfl:    DOXOrubicin HCl (ADRIAMYCIN IV), Inject into the vein every 14 (fourteen) days., Disp: , Rfl:    ibuprofen (ADVIL,MOTRIN) 200 MG tablet, Take 400 mg by mouth every 6 (six) hours as needed for headache or moderate pain., Disp: , Rfl:    levothyroxine (SYNTHROID, LEVOTHROID) 88 MCG tablet, TAKE 88 MCG BY MOUTH IN THE MORNING, Disp: 90 tablet, Rfl: 3   lidocaine-prilocaine (EMLA) cream, Apply a quarter sized amount to port a cath site and cover with plastic wrap 1 hour prior to infusion appointments, Disp: 30 g, Rfl: 3   loratadine (CLARITIN) 10 MG tablet, Take 10 mg by mouth daily., Disp: , Rfl:    ondansetron (ZOFRAN) 4 MG tablet, Take 1 tablet (4 mg total) by mouth every 8 (eight) hours as needed., Disp: 30 tablet, Rfl: 1   oxyCODONE (ROXICODONE) 5 MG immediate release tablet, Take 1 tablet (5 mg total) by mouth every 4 (four) hours as needed for severe pain or breakthrough pain., Disp: 5 tablet, Rfl: 0   Pegfilgrastim-cbqv (UDENYCA [place]), Inject into the skin every 14 (fourteen) days., Disp: , Rfl:    Polyethylene Glycol 400 (VISINE DRY EYE RELIEF OP), Place 1 drop into both eyes daily as needed (dry eye)., Disp: , Rfl:     promethazine (PHENERGAN) 25 MG tablet, Take 1 tablet (25 mg  total) by mouth every 6 (six) hours as needed for nausea or vomiting., Disp: 30 tablet, Rfl: 3   rOPINIRole (REQUIP) 2 MG tablet, TAKE ONE TABLET BY MOUTH AT BEDTIME, Disp: 30 tablet, Rfl: 9   TRINTELLIX 20 MG TABS tablet, Take 20 mg by mouth daily., Disp: , Rfl:    trolamine salicylate (ASPERCREME) 10 % cream, Apply 1 application topically as needed for muscle pain., Disp: , Rfl:    XARELTO 20 MG TABS tablet, , Disp: , Rfl: 2   enoxaparin (LOVENOX) 40 MG/0.4ML injection, Inject 0.4 mLs (40 mg total) into the skin daily for 2 days., Disp: 0.8 mL, Rfl: 0 No current facility-administered medications for this visit.  Facility-Administered Medications Ordered in Other Visits:    cyclophosphamide (CYTOXAN) 1,200 mg in sodium chloride 0.9 % 250 mL chemo infusion, 600 mg/m2 (Treatment Plan Recorded), Intravenous, Once, [name], MD   dexamethasone (DECADRON) 10 mg in sodium chloride 0.9 % 50 mL IVPB, 10 mg, Intravenous, Once, [name], MD   DOXOrubicin (ADRIAMYCIN) chemo injection 120 mg, 60 mg/m2 (Treatment Plan Recorded), Intravenous, Once, [name], MD   fosaprepitant (EMEND) 150 mg in sodium chloride 0.9 % 145 mL IVPB, 150 mg, Intravenous, Once, [name], MD   heparin lock flush 100 unit/mL, 500 Units, Intracatheter, Once PRN, [name], MD   palonosetron (ALOXI) injection 0.25 mg, 0.25 mg, Intravenous, Once, [name], MD   sodium chloride flush (NS) 0.9 % injection 10 mL, 10 mL, Intracatheter, PRN, [name], MD   Allergies: No Known Allergies  REVIEW OF SYSTEMS:   Review of Systems  Constitutional:  Negative for chills, fatigue and fever.  HENT:   Negative for lump/mass, mouth sores, nosebleeds, sore throat and trouble swallowing.   Eyes:  Negative for eye problems.  Respiratory:  Negative for cough and shortness of breath.   Cardiovascular:  Negative  for chest pain, leg swelling and palpitations.  Gastrointestinal:  Positive for nausea. Negative for abdominal pain, constipation, diarrhea and vomiting.  Genitourinary:  Negative for bladder incontinence, difficulty urinating, dysuria, frequency, hematuria and nocturia.   Musculoskeletal:  Negative for arthralgias, back pain, flank pain, myalgias and neck pain.  Skin:  Negative for itching and rash.  Neurological:  Positive for dizziness and headaches. Negative for numbness.  Hematological:  Does not bruise/bleed easily.  Psychiatric/Behavioral:  Positive for sleep disturbance. Negative for depression and suicidal ideas. The patient is not nervous/anxious.   All other systems reviewed and are negative.    VITALS:   Blood pressure 123/78.  Wt Readings from Last 3 Encounters:  03/08/23 196 lb 6.4 oz (89.1 kg)  02/22/23 196 lb 12.8 oz (89.3 kg)  02/09/23 195 lb (88.5 kg)    There is no height or weight on file to calculate BMI.  Performance status (ECOG): 1 - Symptomatic but completely ambulatory  PHYSICAL EXAM:   Physical Exam Vitals and nursing note reviewed. Exam conducted with a chaperone present.  Constitutional:      Appearance: Normal appearance.  Cardiovascular:     Rate and Rhythm: Normal rate and regular rhythm.     Pulses: Normal pulses.     Heart sounds: Normal heart sounds.  Pulmonary:     Effort: Pulmonary effort is normal.     Breath sounds: Normal breath sounds.  Abdominal:     Palpations: Abdomen is soft. There is no hepatomegaly, splenomegaly or mass.     Tenderness: There is no abdominal tenderness.  Musculoskeletal:     Right lower  leg: No edema.     Left lower leg: No edema.  Lymphadenopathy:     Cervical: No cervical adenopathy.     Right cervical: No superficial, deep or posterior cervical adenopathy.    Left cervical: No superficial, deep or posterior cervical adenopathy.     Upper Body:     Right upper body: No supraclavicular or axillary  adenopathy.     Left upper body: No supraclavicular or axillary adenopathy.  Neurological:     General: No focal deficit present.     Mental Status: She is alert and oriented to person, place, and time.  Psychiatric:        Mood and Affect: Mood normal.        Behavior: Behavior normal.     LABS:      Latest Ref Rng & Units 03/08/2023    8:32 AM 02/22/2023    8:07 AM 02/03/2023    8:37 AM  CBC  WBC 4.0 - 10.5 K/uL 10.3  9.5  9.3   Hemoglobin 12.0 - 15.0 g/dL 16.1  09.6  04.5   Hematocrit 36.0 - 46.0 % 40.5  39.8  43.4   Platelets 150 - 400 K/uL 192  240  266       Latest Ref Rng & Units 03/08/2023    8:32 AM 02/22/2023    8:07 AM 02/03/2023    8:37 AM  CMP  Glucose 70 - 99 mg/dL 409  811  98   BUN 6 - 20 mg/dL 14  14  13    Creatinine 0.44 - 1.00 mg/dL 9.14  7.82  9.56   Sodium 135 - 145 mmol/L 138  137  137   Potassium 3.5 - 5.1 mmol/L 3.6  3.5  4.2   Chloride 98 - 111 mmol/L 103  103  102   CO2 22 - 32 mmol/L 24  24  27    Calcium 8.9 - 10.3 mg/dL 8.8  8.8  8.7   Total Protein 6.5 - 8.1 g/dL 6.9  6.9  7.2   Total Bilirubin 0.3 - 1.2 mg/dL 0.5  0.6  0.4   Alkaline Phos 38 - 126 U/L 110  108  110   AST 15 - 41 U/L 26  22  19    ALT 0 - 44 U/L 18  17  19       No results found for: "CEA1", "CEA" / No results found for: "CEA1", "CEA" No results found for: "PSA1" No results found for: "OZH086" No results found for: "CAN125"  No results found for: "TOTALPROTELP", "ALBUMINELP", "A1GS", "A2GS", "BETS", "BETA2SER", "GAMS", "MSPIKE", "SPEI" Lab Results  Component Value Date   FERRITIN 110 11/13/2013   FERRITIN 81 08/07/2013   No results found for: "LDH"   STUDIES:   ECHOCARDIOGRAM COMPLETE  Result Date: 02/16/2023    ECHOCARDIOGRAM REPORT   Patient Name:   Judy Patton  Date of Exam: 02/16/2023 Medical Rec #:  [id number]     Height:       65.0 in Accession #:    [id number]    Weight:       195.0 lb Date of Birth:  [birth year]      BSA:          1.957 m Patient Age:    51 years       BP:           121/61 mmHg Patient Gender: F             HR:  68 bpm. Exam Location:  [address] Procedure: 2D Echo, Cardiac Doppler, Color Doppler, 3D Echo and Strain Analysis Indications:    Chemo Z09;  History:        Patient has no prior history of Echocardiogram examinations.                 Breast Cancer.  Sonographer:    Thurman Coyer RDCS Referring Phys: [id number] [facility] IMPRESSIONS  1. Left ventricular ejection fraction, by estimation, is 55 to 60%. The left ventricle has normal function. The left ventricle has no regional wall motion abnormalities. Left ventricular diastolic parameters are consistent with Grade II diastolic dysfunction (pseudonormalization). The average left ventricular global longitudinal strain is -21.3 %. The global longitudinal strain is normal.  2. Right ventricular systolic function is normal. The right ventricular size is normal. There is normal pulmonary artery systolic pressure. The estimated right ventricular systolic pressure is 23.5 mmHg.  3. The mitral valve is normal in structure. No evidence of mitral valve regurgitation. No evidence of mitral stenosis.  4. The aortic valve is tricuspid. Aortic valve regurgitation is not visualized. No aortic stenosis is present.  5. The inferior vena cava is normal in size with <50% respiratory variability, suggesting right atrial pressure of 8 mmHg. FINDINGS  Left Ventricle: Left ventricular ejection fraction, by estimation, is 55 to 60%. The left ventricle has normal function. The left ventricle has no regional wall motion abnormalities. The average left ventricular global longitudinal strain is -21.3 %. The global longitudinal strain is normal. The left ventricular internal cavity size was normal in size. There is no left ventricular hypertrophy. Left ventricular diastolic parameters are consistent with Grade II diastolic dysfunction (pseudonormalization). Right Ventricle: The right ventricular size is normal.  No increase in right ventricular wall thickness. Right ventricular systolic function is normal. There is normal pulmonary artery systolic pressure. The tricuspid regurgitant velocity is 1.97 m/s, and  with an assumed right atrial pressure of 8 mmHg, the estimated right ventricular systolic pressure is 23.5 mmHg. Left Atrium: Left atrial size was normal in size. Right Atrium: Right atrial size was normal in size. Pericardium: There is no evidence of pericardial effusion. Mitral Valve: The mitral valve is normal in structure. No evidence of mitral valve regurgitation. No evidence of mitral valve stenosis. Tricuspid Valve: The tricuspid valve is normal in structure. Tricuspid valve regurgitation is trivial. Aortic Valve: The aortic valve is tricuspid. Aortic valve regurgitation is not visualized. No aortic stenosis is present. Pulmonic Valve: The pulmonic valve was normal in structure. Pulmonic valve regurgitation is not visualized. Aorta: The aortic root is normal in size and structure. Venous: The inferior vena cava is normal in size with less than 50% respiratory variability, suggesting right atrial pressure of 8 mmHg. IAS/Shunts: No atrial level shunt detected by color flow Doppler.  LEFT VENTRICLE PLAX 2D LVIDd:         5.10 cm   Diastology LVIDs:         3.30 cm   LV e' medial:    5.87 cm/s LV PW:         0.90 cm   LV E/e' medial:  15.6 LV IVS:        0.80 cm   LV e' lateral:   9.25 cm/s LVOT diam:     2.20 cm   LV E/e' lateral: 9.9 LV SV:         65 LV SV Index:   33  2D Longitudinal Strain LVOT Area:     3.80 cm  2D Strain GLS Avg:     -21.3 %                           3D Volume EF:                          3D EF:        56 %                          LV EDV:       120 ml                          LV ESV:       53 ml                          LV SV:        67 ml RIGHT VENTRICLE RV Basal diam:  3.70 cm RV Mid diam:    3.50 cm RV S prime:     11.10 cm/s TAPSE (M-mode): 2.4 cm LEFT ATRIUM             Index         RIGHT ATRIUM           Index LA diam:        3.10 cm 1.58 cm/m   RA Area:     16.10 cm LA Vol (A2C):   24.7 ml 12.62 ml/m  RA Volume:   44.10 ml  22.53 ml/m LA Vol (A4C):   34.5 ml 17.63 ml/m LA Biplane Vol: 29.7 ml 15.18 ml/m  AORTIC VALVE LVOT Vmax:   86.50 cm/s LVOT Vmean:  57.400 cm/s LVOT VTI:    0.172 m  AORTA Ao Root diam: 3.10 cm Ao Asc diam:  3.40 cm MITRAL VALVE               TRICUSPID VALVE MV Area (PHT): 4.21 cm    TR Peak grad:   15.5 mmHg MV Decel Time: 180 msec    TR Vmax:        197.00 cm/s MV E velocity: 91.30 cm/s MV A velocity: 78.40 cm/s  SHUNTS MV E/A ratio:  1.16        Systemic VTI:  0.17 m                            Systemic Diam: 2.20 cm Dalton McleanMD Electronically signed by Wilfred Lacy Signature Date/Time: 02/16/2023/4:38:02 PM    Final    NM PET Image Initial (PI) Skull Base To Thigh  Result Date: 02/14/2023 CLINICAL DATA:  Initial treatment strategy for breast cancer. EXAM: NUCLEAR MEDICINE PET SKULL BASE TO THIGH TECHNIQUE: 10.9 mCi F-18 FDG was injected intravenously. Full-ring PET imaging was performed from the skull base to thigh after the radiotracer. CT data was obtained and used for attenuation correction and anatomic localization. Fasting blood glucose: 76 mg/dl COMPARISON:  CT scan 16/08/9603 FINDINGS: Mediastinal blood pool activity: SUV max 2.8 Liver activity: SUV max NA NECK: Symmetric accentuated activity along the palatine tonsils, maximum SUV 8.0 on the left and 7.4 on the right, likely physiologic. Incidental CT findings: None. CHEST: Postoperative site in the left breast with  maximum SUV in the subcutaneous tissues of 4.2. Mild stranding along the site of prior axillary dissection, in the vicinity of the axillary clips the maximum SUV is 1.4. No specific indicators of active malignancy in the chest. Incidental CT findings: Mild cardiomegaly. Mild scarring or atelectasis in the posterior basal segment left lower lobe. ABDOMEN/PELVIS: No significant  abnormal hypermetabolic activity in this region. Incidental CT findings: Nonobstructive right nephrolithiasis. Dependent density in the gallbladder could be from sludge or gallstones. Mild abdominal aortic atherosclerotic vascular calcification. SKELETON: The rim sclerotic lesion of concern in the left acetabulum demonstrates no accentuated metabolic activity and is likely an enchondroma or similar benign osseous lesion. No hypermetabolic bony lesions suspicious for osseous metastatic disease identified. Incidental CT findings: None. IMPRESSION: 1. Postoperative findings in the left breast and left axilla, with maximum SUV in the subcutaneous tissues of the left breast at the site of prior surgery, maximum SUV is 4.2. No specific indicators of active malignancy. 2. 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 3. Symmetric accentuated activity along the palatine tonsils, likely physiologic. 4. Mild cardiomegaly. 5. Nonobstructive right nephrolithiasis. 6. Dependent density in the gallbladder could be from sludge or gallstones. 7. Aortic atherosclerosis. Aortic Atherosclerosis (ICD10-I70.0). Electronically Signed   By: Gaylyn Rong M.D.   On: 02/14/2023 08:27   DG Chest Port 1 View  Result Date: 02/11/2023 CLINICAL DATA:  Port-A-Cath. EXAM: PORTABLE CHEST 1 VIEW COMPARISON:  CT, 02/08/2023. FINDINGS: Right anterior chest wall power Port-A-Cath has its tip projecting in the lower superior vena cava. No pneumothorax.  Clear lungs.  No pleural effusion. Cardiac silhouette normal in size. Normal mediastinal and hilar contours. Skeletal structures are grossly intact. IMPRESSION: 1. Well-positioned right anterior chest wall Port-A-Cath, tip in the lower superior vena cava. 2. No pneumothorax.  No acute cardiopulmonary disease. Electronically Signed   By: Amie Portland M.D.   On: 02/11/2023  11:31   DG C-Arm 1-60 Min-No Report  Result Date: 02/11/2023 Fluoroscopy was utilized by the requesting physician.  No radiographic interpretation.   CT CHEST ABDOMEN PELVIS W CONTRAST  Result Date: 02/08/2023 CLINICAL DATA:  Left lumpectomy and node biopsy in 2024 for breast cancer. * Tracking Code: BO * EXAM: CT CHEST, ABDOMEN, AND PELVIS WITH CONTRAST TECHNIQUE: Multidetector CT imaging of the chest, abdomen and pelvis was performed following the standard protocol during bolus administration of intravenous contrast. RADIATION DOSE REDUCTION: This exam was performed according to the departmental dose-optimization program which includes automated exposure control, adjustment of the mA and/or kV according to patient size and/or use of iterative reconstruction technique. CONTRAST:  OMNIPAQUE IOHEXOL 300 MG/ML  SOLN COMPARISON:  None Available. FINDINGS: CT CHEST FINDINGS Cardiovascular: Normal caliber of the aorta and branch vessels. Normal heart size, without pericardial effusion. No central pulmonary embolism, on this non-dedicated study. Mediastinum/Nodes: No supraclavicular adenopathy. Left axillary node dissection. No axillary or subpectoral adenopathy. No mediastinal or hilar adenopathy. Lungs/Pleura: No pleural fluid. Minimal motion degradation in the lower chest. No suspicious pulmonary nodule or mass. Musculoskeletal: Left-sided lumpectomy with mild overlying skin thickening. Remote anterior right second rib fracture. CT ABDOMEN PELVIS FINDINGS Hepatobiliary: Normal liver. Normal gallbladder, without biliary ductal dilatation. Pancreas: Normal, without mass or ductal dilatation. Spleen: Normal in size, without focal abnormality. Adrenals/Urinary Tract: Normal adrenal glands. Right renal collecting system calculi of up to 3 mm. Normal left kidney. No hydronephrosis. Normal urinary bladder. Stomach/Bowel:  Normal stomach, without wall thickening. Colonic stool burden suggests constipation. Normal  terminal ileum. Appendix not visualized. Normal small bowel. Vascular/Lymphatic: Aortic atherosclerosis. No abdominopelvic adenopathy. Reproductive: Normal uterus and adnexa. Other: No significant free fluid. No free intraperitoneal air. No evidence of omental or peritoneal disease. Musculoskeletal: Left acetabular sclerotic lesion of 1.4 cm on 103/2. Degenerative disc disease at the lumbosacral junction. IMPRESSION: 1. Left acetabular 1.4 cm sclerotic lesion is indeterminate. If no prior imaging for comparison, consider further evaluation with PET. 2. Otherwise, no evidence of metastatic disease, status post left-sided lumpectomy and axillary node dissection. 3. Right nephrolithiasis 4.  Aortic Atherosclerosis (ICD10-I70.0). Electronically Signed   By: Jeronimo Greaves M.D.   On: 02/08/2023 09:08

## 2023-03-08 NOTE — Patient Instructions (Signed)
[facility] at [facility] Discharge Instructions   You were seen and examined today by Dr. Katragadda.  He reviewed the results of your lab work which are normal/stable.   We will proceed with your treatment today.  Return as scheduled.    Thank you for choosing [facility] at [facility] to provide your oncology and hematology care.  To afford each patient quality time with our provider, please arrive at least 15 minutes before your scheduled appointment time.   If you have a lab appointment with the Cancer Center please come in thru the Main Entrance and check in at the main information desk.  You need to re-schedule your appointment should you arrive 10 or more minutes late.  We strive to give you quality time with our providers, and arriving late affects you and other patients whose appointments are after yours.  Also, if you no show three or more times for appointments you may be dismissed from the clinic at the providers discretion.     Again, thank you for choosing [facility].  Our hope is that these requests will decrease the amount of time that you wait before being seen by our physicians.       _____________________________________________________________  Should you have questions after your visit to [facility], please contact our office at [phone] and follow the prompts.  Our office hours are 8:00 a.m. and 4:30 p.m. Monday - Friday.  Please note that voicemails left after 4:00 p.m. may not be returned until the following business day.  We are closed weekends and major holidays.  You do have access to a nurse 24-7, just call the main number to the clinic [phone] and do not press any options, hold on the line and a nurse will answer the phone.    For prescription refill requests, have your pharmacy contact our office and allow 72 hours.    Due to Covid, you will need to wear a mask upon entering  the hospital. If you do not have a mask, a mask will be given to you at the Main Entrance upon arrival. For doctor visits, patients may have 1 support person age 18 or older with them. For treatment visits, patients can not have anyone with them due to social distancing guidelines and our immunocompromised population.      

## 2023-03-08 NOTE — Progress Notes (Signed)
Patient presents today for Doxorubicin/Cytoxan infusion per providers order.  Vital signs and labs reviewed by MD.  Message received from Chapman Moss RN/Dr. Ellin Saba patient okay for treatment.  Treatment given today per MD orders.  Stable during infusion without adverse affects.  Vital signs stable.  No complaints at this time.  Discharge from clinic ambulatory in stable condition.  Alert and oriented X 3.  Follow up with [facility] as scheduled.

## 2023-03-08 NOTE — Progress Notes (Signed)
Patient has been examined by Dr. Katragadda. Vital signs and labs have been reviewed by MD - ANC, Creatinine, LFTs, hemoglobin, and platelets are within treatment parameters per M.D. - pt may proceed with treatment.  Primary RN and pharmacy notified.  

## 2023-03-10 ENCOUNTER — Inpatient Hospital Stay: Payer: BC Managed Care – PPO

## 2023-03-10 ENCOUNTER — Telehealth: Payer: BC Managed Care – PPO | Admitting: Licensed Clinical Social Worker

## 2023-03-10 VITALS — BP 122/71 | HR 75 | Temp 98.7°F | Resp 18

## 2023-03-10 DIAGNOSIS — Z170 Estrogen receptor positive status [ER+]: Secondary | ICD-10-CM

## 2023-03-10 DIAGNOSIS — Z5111 Encounter for antineoplastic chemotherapy: Secondary | ICD-10-CM | POA: Diagnosis not present

## 2023-03-10 DIAGNOSIS — Z95828 Presence of other vascular implants and grafts: Secondary | ICD-10-CM

## 2023-03-10 MED ORDER — PEGFILGRASTIM-JMDB 6 MG/0.6ML ~~LOC~~ SOSY
6.0000 mg | PREFILLED_SYRINGE | Freq: Once | SUBCUTANEOUS | Status: AC
  Administered 2023-03-10: 6 mg via SUBCUTANEOUS
  Filled 2023-03-10: qty 0.6

## 2023-03-10 NOTE — Progress Notes (Signed)
Patient tolerated injection with no complaints voiced. Site clean and dry with no bruising or swelling noted at site. See MAR for details. Band aid applied.  Patient stable during and after injection. VSS with discharge and left in satisfactory condition with no s/s of distress noted.  

## 2023-03-10 NOTE — Patient Instructions (Signed)
[facility]  Discharge Instructions: Thank you for choosing [facility] to provide your oncology and hematology care.  If you have a lab appointment with the Cancer Center - please note that after April 8th, 2024, all labs will be drawn in the cancer center.  You do not have to check in or register with the main entrance as you have in the past but will complete your check-in in the cancer center.  Wear comfortable clothing and clothing appropriate for easy access to any Portacath or PICC line.   We strive to give you quality time with your provider. You may need to reschedule your appointment if you arrive late (15 or more minutes).  Arriving late affects you and other patients whose appointments are after yours.  Also, if you miss three or more appointments without notifying the office, you may be dismissed from the clinic at the provider's discretion.      For prescription refill requests, have your pharmacy contact our office and allow 72 hours for refills to be completed.    Today you received the following pegfilgrtastim-jmdb injection return as scheduled.   To help prevent nausea and vomiting after your treatment, we encourage you to take your nausea medication as directed.  BELOW ARE SYMPTOMS THAT SHOULD BE REPORTED IMMEDIATELY: *FEVER GREATER THAN 100.4 F (38 C) OR HIGHER *CHILLS OR SWEATING *NAUSEA AND VOMITING THAT IS NOT CONTROLLED WITH YOUR NAUSEA MEDICATION *UNUSUAL SHORTNESS OF BREATH *UNUSUAL BRUISING OR BLEEDING *URINARY PROBLEMS (pain or burning when urinating, or frequent urination) *BOWEL PROBLEMS (unusual diarrhea, constipation, pain near the anus) TENDERNESS IN MOUTH AND THROAT WITH OR WITHOUT PRESENCE OF ULCERS (sore throat, sores in mouth, or a toothache) UNUSUAL RASH, SWELLING OR PAIN  UNUSUAL VAGINAL DISCHARGE OR ITCHING   Items with * indicate a potential emergency and should be followed up as soon as possible or go to the  Emergency Department if any problems should occur.  Please show the CHEMOTHERAPY ALERT CARD or IMMUNOTHERAPY ALERT CARD at check-in to the Emergency Department and triage nurse.  Should you have questions after your visit or need to cancel or reschedule your appointment, please contact [facility] AT [facility] [phone]  and follow the prompts.  Office hours are 8:00 a.m. to 4:30 p.m. Monday - Friday. Please note that voicemails left after 4:00 p.m. may not be returned until the following business day.  We are closed weekends and major holidays. You have access to a nurse at all times for urgent questions. Please call the main number to the clinic [phone] and follow the prompts.  For any non-urgent questions, you may also contact your provider using MyChart. We now offer e-Visits for anyone 31 and older to request care online for non-urgent symptoms. For details visit [email or web address].   Also download the MyChart app! Go to the app store, search "MyChart", open the app, select [place], and log in with your MyChart username and password.

## 2023-03-15 ENCOUNTER — Other Ambulatory Visit: Payer: Self-pay

## 2023-03-16 ENCOUNTER — Other Ambulatory Visit: Payer: Self-pay | Admitting: *Deleted

## 2023-03-16 ENCOUNTER — Telehealth: Payer: Self-pay | Admitting: *Deleted

## 2023-03-16 MED ORDER — ALUMINUM & MAGNESIUM HYDROXIDE 200-200 MG/5ML PO SUSP: 15.0000 mL | Freq: Four times a day (QID) | ORAL | 0 refills | Status: DC | PRN

## 2023-03-16 MED ORDER — LIDOCAINE VISCOUS HCL 2 % MT SOLN: 15.0000 mL | Freq: Four times a day (QID) | OROMUCOSAL | 0 refills | Status: DC | PRN

## 2023-03-16 NOTE — Telephone Encounter (Signed)
Patient called to advise that she has developed soreness in moth and throat with 2 sores on inside of mouth.  Per protocol, lidocaine 2% viscous and maalox 1:1 mix for swish and swallow sent to pharmacy.  Patient aware.

## 2023-03-18 ENCOUNTER — Other Ambulatory Visit: Payer: Self-pay | Admitting: *Deleted

## 2023-03-18 MED ORDER — SUCRALFATE 1 GM/10ML PO SUSP: 1.0000 g | Freq: Three times a day (TID) | ORAL | 0 refills | Status: DC

## 2023-03-18 NOTE — Telephone Encounter (Signed)
Per patient, maalox/lidocaine mixture is making it more difficult for her to eat.  Per Dr. Elbert Ewings will send in Carafate suspension.

## 2023-03-19 ENCOUNTER — Other Ambulatory Visit: Payer: Self-pay

## 2023-03-22 ENCOUNTER — Inpatient Hospital Stay: Payer: BC Managed Care – PPO

## 2023-03-22 ENCOUNTER — Inpatient Hospital Stay (HOSPITAL_BASED_OUTPATIENT_CLINIC_OR_DEPARTMENT_OTHER): Payer: BC Managed Care – PPO | Admitting: Hematology

## 2023-03-22 VITALS — BP 118/81 | HR 73 | Temp 96.9°F | Resp 18

## 2023-03-22 DIAGNOSIS — Z95828 Presence of other vascular implants and grafts: Secondary | ICD-10-CM

## 2023-03-22 DIAGNOSIS — Z170 Estrogen receptor positive status [ER+]: Secondary | ICD-10-CM

## 2023-03-22 DIAGNOSIS — C50412 Malignant neoplasm of upper-outer quadrant of left female breast: Secondary | ICD-10-CM | POA: Diagnosis not present

## 2023-03-22 DIAGNOSIS — Z5111 Encounter for antineoplastic chemotherapy: Secondary | ICD-10-CM | POA: Diagnosis not present

## 2023-03-22 LAB — CBC WITH DIFFERENTIAL/PLATELET
Abs Immature Granulocytes: 0.5 10*3/uL — ABNORMAL HIGH (ref 0.00–0.07)
Band Neutrophils: 7 %
Basophils Absolute: 0 10*3/uL (ref 0.0–0.1)
Basophils Relative: 0 %
Eosinophils Absolute: 0 10*3/uL (ref 0.0–0.5)
Eosinophils Relative: 0 %
HCT: 34.7 % — ABNORMAL LOW (ref 36.0–46.0)
Hemoglobin: 11.6 g/dL — ABNORMAL LOW (ref 12.0–15.0)
Lymphocytes Relative: 22 %
Lymphs Abs: 2.1 10*3/uL (ref 0.7–4.0)
MCH: 30.8 pg (ref 26.0–34.0)
MCHC: 33.4 g/dL (ref 30.0–36.0)
MCV: 92 fL (ref 80.0–100.0)
Metamyelocytes Relative: 5 %
Monocytes Absolute: 0.9 10*3/uL (ref 0.1–1.0)
Monocytes Relative: 9 %
Neutro Abs: 6.2 10*3/uL (ref 1.7–7.7)
Neutrophils Relative %: 57 %
Platelets: 150 10*3/uL (ref 150–400)
RBC: 3.77 MIL/uL — ABNORMAL LOW (ref 3.87–5.11)
RDW: 12.9 % (ref 11.5–15.5)
WBC: 9.7 10*3/uL (ref 4.0–10.5)
nRBC: 0 % (ref 0.0–0.2)

## 2023-03-22 LAB — COMPREHENSIVE METABOLIC PANEL WITH GFR
ALT: 17 U/L (ref 0–44)
AST: 20 U/L (ref 15–41)
Albumin: 3.6 g/dL (ref 3.5–5.0)
Alkaline Phosphatase: 113 U/L (ref 38–126)
Anion gap: 5 (ref 5–15)
BUN: 11 mg/dL (ref 6–20)
CO2: 26 mmol/L (ref 22–32)
Calcium: 8.4 mg/dL — ABNORMAL LOW (ref 8.9–10.3)
Chloride: 106 mmol/L (ref 98–111)
Creatinine, Ser: 0.6 mg/dL (ref 0.44–1.00)
GFR, Estimated: >60 mL/min
Glucose, Bld: 127 mg/dL — ABNORMAL HIGH (ref 70–99)
Potassium: 3.4 mmol/L — ABNORMAL LOW (ref 3.5–5.1)
Sodium: 137 mmol/L (ref 135–145)
Total Bilirubin: 0.3 mg/dL (ref 0.3–1.2)
Total Protein: 6.6 g/dL (ref 6.5–8.1)

## 2023-03-22 LAB — MAGNESIUM: Magnesium: 2 mg/dL (ref 1.7–2.4)

## 2023-03-22 MED ORDER — SODIUM CHLORIDE 0.9 % IV SOLN: Freq: Once | INTRAVENOUS | Status: AC

## 2023-03-22 MED ORDER — SODIUM CHLORIDE 0.9 % IV SOLN
150.0000 mg | Freq: Once | INTRAVENOUS | Status: AC
  Administered 2023-03-22: 150 mg via INTRAVENOUS
  Filled 2023-03-22: qty 150

## 2023-03-22 MED ORDER — POTASSIUM CHLORIDE CRYS ER 20 MEQ PO TBCR
40.0000 meq | EXTENDED_RELEASE_TABLET | Freq: Once | ORAL | Status: AC
  Administered 2023-03-22: 40 meq via ORAL
  Filled 2023-03-22: qty 2

## 2023-03-22 MED ORDER — SODIUM CHLORIDE 0.9% FLUSH
10.0000 mL | INTRAVENOUS | Status: DC | PRN
  Administered 2023-03-22: 10 mL

## 2023-03-22 MED ORDER — SODIUM CHLORIDE 0.9 % IV SOLN
10.0000 mg | Freq: Once | INTRAVENOUS | Status: AC
  Administered 2023-03-22: 10 mg via INTRAVENOUS
  Filled 2023-03-22: qty 10

## 2023-03-22 MED ORDER — SODIUM CHLORIDE 0.9 % IV SOLN
600.0000 mg/m2 | Freq: Once | INTRAVENOUS | Status: AC
  Administered 2023-03-22: 1200 mg via INTRAVENOUS
  Filled 2023-03-22: qty 60

## 2023-03-22 MED ORDER — PALONOSETRON HCL INJECTION 0.25 MG/5ML
0.2500 mg | Freq: Once | INTRAVENOUS | Status: AC
  Administered 2023-03-22: 0.25 mg via INTRAVENOUS
  Filled 2023-03-22: qty 5

## 2023-03-22 MED ORDER — HEPARIN SOD (PORK) LOCK FLUSH 100 UNIT/ML IV SOLN
500.0000 [IU] | Freq: Once | INTRAVENOUS | Status: AC | PRN
  Administered 2023-03-22: 500 [IU]

## 2023-03-22 MED ORDER — DOXORUBICIN HCL CHEMO IV INJECTION 2 MG/ML
60.0000 mg/m2 | Freq: Once | INTRAVENOUS | Status: AC
  Administered 2023-03-22: 120 mg via INTRAVENOUS
  Filled 2023-03-22: qty 60

## 2023-03-22 MED ORDER — SODIUM CHLORIDE 0.9% FLUSH
10.0000 mL | Freq: Once | INTRAVENOUS | Status: AC
  Administered 2023-03-22: 10 mL via INTRAVENOUS

## 2023-03-22 NOTE — Progress Notes (Signed)
[facility] [address] [place] [address]    Clinic Day:  03/22/2023  Referring physician: [name], PA-C  Patient Care Team: [name], PA-C as PCP - General (Physician Assistant) [name], MD as Medical Oncologist (Medical Oncology)   ASSESSMENT & PLAN:   Assessment: 1.  Stage I (T1c N1 M0, G2, ER/PR+, HER2-) left breast UOQ IDC: - Screening mammogram (12/01/2022): Possible distortion in the left breast.  Right breast has no findings suspicious for malignancy. - Left breast diagnostic mammogram/US (12/14/2022): Irregular hypoechoic mass at the 1 o'clock position of the left breast, 4 cm from nipple, measuring 1 x 0.7 x 0.9 cm.  No pathologic lymphadenopathy in the left axilla. - Left breast biopsy (12/16/2022): Invasive ductal carcinoma, grade 2, ER/PR 100%, Ki-67 1%, HER2 0 by IHC. - Left breast lumpectomy and SLNB on 01/10/2023. - Pathology: 1.4 x 1.1 x 1.1 cm moderately differentiated IDC, grade 2, margins negative.  1 sentinel lymph node with metastatic carcinoma (1.1 mm).  1 lymph node with micrometastasis (1 mm).  3 other sentinel lymph nodes negative for invasive carcinoma.  PT1 cpN1a. - Oncotype DX recurrence score 14. - CT CAP (02/08/2023): Left Staebler 1.4 cm sclerotic lesion indeterminate.  No evidence of metastatic disease.  Right nephrolithiasis. - PET scan (02/10/2023): Postop changes in the left breast and axilla.  No uptake in the left established lesion. - 2D echo (02/16/2023): LVEF 55 to 60%. - Cycle 1 AC on 02/22/2023   2.  Social/family history: - She lives at home with her husband.  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  No prior history of smoking. - Paternal grandmother had breast cancer.  Brother had kidney cancer.  Father had prostate cancer.   3.  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  Probable infarct in  the left lung base. - Heterozygosity for factor V Leiden mutation.  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  PT mutation negative.  Protein C, protein S, AT III normal. - She has been on Xarelto since then.   4.  Bone health: - DEXA scan (01/04/2023): T-score -0.3, normal.    Plan: 1.  Stage IB (T1c N1 M0, G2, ER/PR+, HER2-) left breast UOQ IDC: - She felt tired longer after cycle 2. She had tiredness approximately 10 days. - Reviewed labs today: Normal LFTs and electrolytes.  CBC grossly normal. - Proceed with cycle 3 today without any dose modifications.   2.  Nausea prophylaxis: - Continue Phenergan as needed.  Use Zofran if Phenergan does not work.   3.  Restless leg syndrome: - Continue Requip 4 mg at bedtime.   4.  Acid reflux: - Continue Pepcid 20 mg twice daily.  5.  Mucositis: - Could not tolerate Magic mouthwash with lidocaine. - Continue Carafate 4 times daily.    No orders of the defined types were placed in this encounter.     I,Katie Daubenspeck,acting as a Neurosurgeon for [name], MD.,have documented all relevant documentation on the behalf of [name], MD,as directed by  [name], MD while in the presence of [name], MD.   I, [name] MD, have reviewed the above documentation for accuracy and completeness, and I agree with the above.   [name], MD   5/21/20246:25 PM  CHIEF COMPLAINT:   Diagnosis: left breast cancer    Cancer Staging  Breast cancer of upper-outer quadrant of left female  breast [facility]) Staging form: Breast, AJCC 8th Edition - Clinical stage from 12/23/2022: Stage IB (cT1c, cN1(sn), cM0, G2, ER+, PR+, HER2-) - Unsigned    Prior Therapy: Lumpectomy and SLNB, 01/10/23  Current Therapy:  Dose dense AC followed by paclitaxel    HISTORY OF PRESENT ILLNESS:   Oncology History  Breast cancer of upper-outer quadrant of left female breast (HCC)  12/23/2022 Initial Diagnosis   Breast cancer of  upper-outer quadrant of left female breast   02/22/2023 -  Chemotherapy   Patient is on Treatment Plan : BREAST ADJUVANT DOSE DENSE AC q14d / PACLitaxel q7d        INTERVAL HISTORY:   Judy Patton is a 52 y.o. female presenting to clinic today for follow up of left breast cancer. She was last seen by me on 03/08/23.  Today, she states that she is doing well overall. Her appetite level is at 50%. Her energy level is at 50%.  PAST MEDICAL HISTORY:   Past Medical History: Past Medical History:  Diagnosis Date   Anxiety    Chronic headaches    Depression    Factor V Leiden mutation (HCC)    Fatigue 02/18/2015   History of kidney stones    Hypothyroidism    MRSA (methicillin resistant Staphylococcus aureus)    Pain of tooth socket 03/14/2015   Pancreatitis    PONV (postoperative nausea and vomiting)    Port-A-Cath in place 02/14/2023   Pulmonary embolus (HCC)    Restless leg syndrome    Shingles rash 03/14/2015   Shoulder injury    Stress    family   Weight gain 02/18/2015    Surgical History: Past Surgical History:  Procedure Laterality Date   BREAST BIOPSY Left 12/16/2022   [place] LT BREAST BX W LOC DEV 1ST LESION IMG BX SPEC US GUIDE 12/16/2022 [facility]   BREAST BIOPSY Left 01/04/2023   [place] LT RADIO FREQUENCY TAG LOC US GUIDE 01/04/2023 Edwin Cap, MD [facility]   COLONOSCOPY  03/25/2004   NWG:NFAO canal hemorrhoids, otherwise normal rectum,   DILITATION & CURRETTAGE/HYSTROSCOPY WITH NOVASURE ABLATION N/A 11/08/2017   Procedure: DILATATION & CURETTAGE/HYSTEROSCOPY WITH NOVASURE ENDOMETRIAL ABLATION;  Surgeon: Tilda Burrow, MD;  Location: [facility];  Service: Gynecology;  Laterality: N/A;   ESOPHAGOGASTRODUODENOSCOPY  03/25/2004   ZHY:QMVHQI esophagus, small hiatal hernia, otherwise normal stomach   kidney stones  05/2014,11/2014   LAPAROSCOPIC BILATERAL SALPINGECTOMY Bilateral 11/08/2017   Procedure: LAPAROSCOPIC BILATERAL SALPINGECTOMY;  Surgeon: Tilda Burrow, MD;   Location: [facility];  Service: Gynecology;  Laterality: Bilateral;   LITHOTRIPSY  2008   PARTIAL MASTECTOMY WITH AXILLARY SENTINEL LYMPH NODE BIOPSY Left 01/10/2023   Procedure: PARTIAL MASTECTOMY WITH AXILLARY SENTINEL LYMPH NODE BIOPSY AND RADIOFREQUENCY TAG PLACEMENT;  Surgeon: Lucretia Roers, MD;  Location: [facility];  Service: General;  Laterality: Left;   PORTACATH PLACEMENT Right 02/11/2023   Procedure: INSERTION PORT-A-CATH;  Surgeon: Lucretia Roers, MD;  Location: [facility];  Service: General;  Laterality: Right;    Social History: Social History   Socioeconomic History   Marital status: Married    Spouse name: Not on file   Number of children: Not on file   Years of education: Not on file   Highest education level: Not on file  Occupational History   Not on file  Tobacco Use   Smoking status: Never   Smokeless tobacco: Never  Vaping Use   Vaping Use: Never used  Substance and Sexual Activity   Alcohol use: Yes  Comment: occ   Drug use: No   Sexual activity: Not Currently    Birth control/protection: Surgical  Other Topics Concern   Not on file  Social History Narrative   Not on file   Social Determinants of Health   Financial Resource Strain: Not on file  Food Insecurity: No Food Insecurity (12/23/2022)   Hunger Vital Sign    Worried About Running Out of Food in the Last Year: Never true    Ran Out of Food in the Last Year: Never true  Transportation Needs: No Transportation Needs (12/23/2022)   PRAPARE - Transportation    Lack of Transportation (Medical): No    Lack of Transportation (Non-Medical): No  Physical Activity: Not on file  Stress: Not on file  Social Connections: Not on file  Intimate Partner Violence: Not At Risk (12/23/2022)   Humiliation, Afraid, Rape, and Kick questionnaire    Fear of Current or Ex-Partner: No    Emotionally Abused: No    Physically Abused: No    Sexually Abused: No    Family History: Family History  Problem Relation  Age of Onset   Stroke Mother    CAD Mother    Prostate cancer Father 68   Kidney cancer Brother        dx 46s   Factor V Leiden deficiency Brother    Fibromyalgia Brother    Breast cancer Paternal Grandmother        dx >50   Lung cancer Paternal Grandfather     Current Medications:  Current Outpatient Medications:    acetaminophen (TYLENOL) 500 MG tablet, Take 1,000 mg by mouth every 6 (six) hours as needed for moderate pain or headache., Disp: , Rfl:    aluminum-magnesium hydroxide 200-200 MG/5ML suspension, Take 15 mLs by mouth 4 (four) times daily as needed for indigestion. Mix 1:1 with lidocaine and swish and swallow, Disp: 355 mL, Rfl: 0   CYCLOPHOSPHAMIDE IV, Inject into the vein every 14 (fourteen) days., Disp: , Rfl:    DOXOrubicin HCl (ADRIAMYCIN IV), Inject into the vein every 14 (fourteen) days., Disp: , Rfl:    ibuprofen (ADVIL,MOTRIN) 200 MG tablet, Take 400 mg by mouth every 6 (six) hours as needed for headache or moderate pain., Disp: , Rfl:    levothyroxine (SYNTHROID, LEVOTHROID) 88 MCG tablet, TAKE 88 MCG BY MOUTH IN THE MORNING, Disp: 90 tablet, Rfl: 3   lidocaine (XYLOCAINE) 2 % solution, Use as directed 15 mLs in the mouth or throat 4 (four) times daily as needed for mouth pain. Mix 1:1 with maalox and swish and swallow, Disp: 100 mL, Rfl: 0   loratadine (CLARITIN) 10 MG tablet, Take 10 mg by mouth daily., Disp: , Rfl:    oxyCODONE (ROXICODONE) 5 MG immediate release tablet, Take 1 tablet (5 mg total) by mouth every 4 (four) hours as needed for severe pain or breakthrough pain., Disp: 5 tablet, Rfl: 0   Pegfilgrastim-cbqv (UDENYCA [place]), Inject into the skin every 14 (fourteen) days., Disp: , Rfl:    Polyethylene Glycol 400 (VISINE DRY EYE RELIEF OP), Place 1 drop into both eyes daily as needed (dry eye)., Disp: , Rfl:    rOPINIRole (REQUIP) 2 MG tablet, TAKE ONE TABLET BY MOUTH AT BEDTIME, Disp: 30 tablet, Rfl: 9   sucralfate (CARAFATE) 1 GM/10ML suspension, Take 10  mLs (1 g total) by mouth 4 (four) times daily -  with meals and at bedtime., Disp: 420 mL, Rfl: 0   TRINTELLIX 20 MG TABS tablet,  Take 20 mg by mouth daily., Disp: , Rfl:    trolamine salicylate (ASPERCREME) 10 % cream, Apply 1 application topically as needed for muscle pain., Disp: , Rfl:    XARELTO 20 MG TABS tablet, , Disp: , Rfl: 2   enoxaparin (LOVENOX) 40 MG/0.4ML injection, Inject 0.4 mLs (40 mg total) into the skin daily for 2 days., Disp: 0.8 mL, Rfl: 0   lidocaine-prilocaine (EMLA) cream, Apply a quarter sized amount to port a cath site and cover with plastic wrap 1 hour prior to infusion appointments (Patient not taking: Reported on 03/22/2023), Disp: 30 g, Rfl: 3   ondansetron (ZOFRAN) 4 MG tablet, Take 1 tablet (4 mg total) by mouth every 8 (eight) hours as needed. (Patient not taking: Reported on 03/22/2023), Disp: 30 tablet, Rfl: 1   promethazine (PHENERGAN) 25 MG tablet, Take 1 tablet (25 mg total) by mouth every 6 (six) hours as needed for nausea or vomiting. (Patient not taking: Reported on 03/22/2023), Disp: 30 tablet, Rfl: 3   Allergies: No Known Allergies  REVIEW OF SYSTEMS:   Review of Systems  Constitutional:  Positive for fatigue. Negative for chills and fever.  HENT:   Positive for mouth sores. Negative for lump/mass, nosebleeds, sore throat and trouble swallowing.   Eyes:  Negative for eye problems.  Respiratory:  Negative for cough and shortness of breath.   Cardiovascular:  Negative for chest pain, leg swelling and palpitations.  Gastrointestinal:  Positive for nausea. Negative for abdominal pain, constipation, diarrhea and vomiting.  Genitourinary:  Negative for bladder incontinence, difficulty urinating, dysuria, frequency, hematuria and nocturia.   Musculoskeletal:  Negative for arthralgias, back pain, flank pain, myalgias and neck pain.  Skin:  Negative for itching and rash.  Neurological:  Negative for dizziness, headaches and numbness.  Hematological:  Does not  bruise/bleed easily.  Psychiatric/Behavioral:  Negative for depression, sleep disturbance and suicidal ideas. The patient is not nervous/anxious.   All other systems reviewed and are negative.    VITALS:   There were no vitals taken for this visit.  Wt Readings from Last 3 Encounters:  03/22/23 193 lb 6.4 oz (87.7 kg)  03/08/23 196 lb 6.4 oz (89.1 kg)  02/22/23 196 lb 12.8 oz (89.3 kg)    There is no height or weight on file to calculate BMI.  Performance status (ECOG): 1 - Symptomatic but completely ambulatory  PHYSICAL EXAM:   Physical Exam Vitals and nursing note reviewed. Exam conducted with a chaperone present.  Constitutional:      Appearance: Normal appearance.  Cardiovascular:     Rate and Rhythm: Normal rate and regular rhythm.     Pulses: Normal pulses.     Heart sounds: Normal heart sounds.  Pulmonary:     Effort: Pulmonary effort is normal.     Breath sounds: Normal breath sounds.  Abdominal:     Palpations: Abdomen is soft. There is no hepatomegaly, splenomegaly or mass.     Tenderness: There is no abdominal tenderness.  Musculoskeletal:     Right lower leg: No edema.     Left lower leg: No edema.  Lymphadenopathy:     Cervical: No cervical adenopathy.     Right cervical: No superficial, deep or posterior cervical adenopathy.    Left cervical: No superficial, deep or posterior cervical adenopathy.     Upper Body:     Right upper body: No supraclavicular or axillary adenopathy.     Left upper body: No supraclavicular or axillary adenopathy.  Neurological:  General: No focal deficit present.     Mental Status: She is alert and oriented to person, place, and time.  Psychiatric:        Mood and Affect: Mood normal.        Behavior: Behavior normal.     LABS:      Latest Ref Rng & Units 03/22/2023    9:12 AM 03/08/2023    8:32 AM 02/22/2023    8:07 AM  CBC  WBC 4.0 - 10.5 K/uL 9.7  10.3  9.5   Hemoglobin 12.0 - 15.0 g/dL 16.1  09.6  04.5    Hematocrit 36.0 - 46.0 % 34.7  40.5  39.8   Platelets 150 - 400 K/uL 150  192  240       Latest Ref Rng & Units 03/22/2023    9:12 AM 03/08/2023    8:32 AM 02/22/2023    8:07 AM  CMP  Glucose 70 - 99 mg/dL 409  811  914   BUN 6 - 20 mg/dL 11  14  14    Creatinine 0.44 - 1.00 mg/dL 7.82  9.56  2.13   Sodium 135 - 145 mmol/L 137  138  137   Potassium 3.5 - 5.1 mmol/L 3.4  3.6  3.5   Chloride 98 - 111 mmol/L 106  103  103   CO2 22 - 32 mmol/L 26  24  24    Calcium 8.9 - 10.3 mg/dL 8.4  8.8  8.8   Total Protein 6.5 - 8.1 g/dL 6.6  6.9  6.9   Total Bilirubin 0.3 - 1.2 mg/dL 0.3  0.5  0.6   Alkaline Phos 38 - 126 U/L 113  110  108   AST 15 - 41 U/L 20  26  22    ALT 0 - 44 U/L 17  18  17       No results found for: "CEA1", "CEA" / No results found for: "CEA1", "CEA" No results found for: "PSA1" No results found for: "YQM578" No results found for: "CAN125"  No results found for: "TOTALPROTELP", "ALBUMINELP", "A1GS", "A2GS", "BETS", "BETA2SER", "GAMS", "MSPIKE", "SPEI" Lab Results  Component Value Date   FERRITIN 110 11/13/2013   FERRITIN 81 08/07/2013   No results found for: "LDH"   STUDIES:   No results found.

## 2023-03-22 NOTE — Progress Notes (Signed)
Patient has been examined by Dr. Katragadda. Vital signs and labs have been reviewed by MD - ANC, Creatinine, LFTs, hemoglobin, and platelets are within treatment parameters per M.D. - pt may proceed with treatment.  Primary RN and pharmacy notified.  

## 2023-03-22 NOTE — Patient Instructions (Signed)
[facility] Discharge Instructions   You were seen and examined today by Dr. Katragadda.  He reviewed the results of your lab work which are normal/stable.   We will proceed with your treatment today.  Return as scheduled.    Thank you for choosing [facility] at [facility] to provide your oncology and hematology care.  To afford each patient quality time with our provider, please arrive at least 15 minutes before your scheduled appointment time.   If you have a lab appointment with the Cancer Center please come in thru the Main Entrance and check in at the main information desk.  You need to re-schedule your appointment should you arrive 10 or more minutes late.  We strive to give you quality time with our providers, and arriving late affects you and other patients whose appointments are after yours.  Also, if you no show three or more times for appointments you may be dismissed from the clinic at the providers discretion.     Again, thank you for choosing [facility].  Our hope is that these requests will decrease the amount of time that you wait before being seen by our physicians.       _____________________________________________________________  Should you have questions after your visit to  Cancer Center, please contact our office at [phone] and follow the prompts.  Our office hours are 8:00 a.m. and 4:30 p.m. Monday - Friday.  Please note that voicemails left after 4:00 p.m. may not be returned until the following business day.  We are closed weekends and major holidays.  You do have access to a nurse 24-7, just call the main number to the clinic [phone] and do not press any options, hold on the line and a nurse will answer the phone.    For prescription refill requests, have your pharmacy contact our office and allow 72 hours.    Due to Covid, you will need to wear a mask upon entering  the hospital. If you do not have a mask, a mask will be given to you at the Main Entrance upon arrival. For doctor visits, patients may have 1 support person age 18 or older with them. For treatment visits, patients can not have anyone with them due to social distancing guidelines and our immunocompromised population.      

## 2023-03-22 NOTE — Progress Notes (Signed)
Patient presents today for Adriamycin/Cytoxan infusions per providers order.  Vital signs and labs reviewed by MD.  Message received from Chapman Moss RN/Dr. Ellin Saba patient okay for treatment.  Treatment given today per MD orders.  Stable during infusion without adverse affects.  Vital signs stable.  No complaints at this time.  Discharge from clinic ambulatory in stable condition.  Alert and oriented X 3.  Follow up with [facility] as scheduled.

## 2023-03-22 NOTE — Patient Instructions (Signed)
[facility]  Discharge Instructions: Thank you for choosing [facility] to provide your oncology and hematology care.  If you have a lab appointment with the Cancer Center - please note that after April 8th, 2024, all labs will be drawn in the cancer center.  You do not have to check in or register with the main entrance as you have in the past but will complete your check-in in the cancer center.  Wear comfortable clothing and clothing appropriate for easy access to any Portacath or PICC line.   We strive to give you quality time with your provider. You may need to reschedule your appointment if you arrive late (15 or more minutes).  Arriving late affects you and other patients whose appointments are after yours.  Also, if you miss three or more appointments without notifying the office, you may be dismissed from the clinic at the provider's discretion.      For prescription refill requests, have your pharmacy contact our office and allow 72 hours for refills to be completed.    Today you received the following chemotherapy and/or immunotherapy agents Doxorubicin/Cytoxan      To help prevent nausea and vomiting after your treatment, we encourage you to take your nausea medication as directed.  BELOW ARE SYMPTOMS THAT SHOULD BE REPORTED IMMEDIATELY: *FEVER GREATER THAN 100.4 F (38 C) OR HIGHER *CHILLS OR SWEATING *NAUSEA AND VOMITING THAT IS NOT CONTROLLED WITH YOUR NAUSEA MEDICATION *UNUSUAL SHORTNESS OF BREATH *UNUSUAL BRUISING OR BLEEDING *URINARY PROBLEMS (pain or burning when urinating, or frequent urination) *BOWEL PROBLEMS (unusual diarrhea, constipation, pain near the anus) TENDERNESS IN MOUTH AND THROAT WITH OR WITHOUT PRESENCE OF ULCERS (sore throat, sores in mouth, or a toothache) UNUSUAL RASH, SWELLING OR PAIN  UNUSUAL VAGINAL DISCHARGE OR ITCHING   Items with * indicate a potential emergency and should be followed up as soon as possible or  go to the Emergency Department if any problems should occur.  Please show the CHEMOTHERAPY ALERT CARD or IMMUNOTHERAPY ALERT CARD at check-in to the Emergency Department and triage nurse.  Should you have questions after your visit or need to cancel or reschedule your appointment, please contact [facility] [phone]  and follow the prompts.  Office hours are 8:00 a.m. to 4:30 p.m. Monday - Friday. Please note that voicemails left after 4:00 p.m. may not be returned until the following business day.  We are closed weekends and major holidays. You have access to a nurse at all times for urgent questions. Please call the main number to the clinic [phone] and follow the prompts.  For any non-urgent questions, you may also contact your provider using MyChart. We now offer e-Visits for anyone 18 and older to request care online for non-urgent symptoms. For details visit [email or web address].   Also download the MyChart app! Go to the app store, search "MyChart", open the app, select [place], and log in with your MyChart username and password.   

## 2023-03-22 NOTE — Progress Notes (Signed)
Patients port flushed without difficulty.  Good blood return noted with no bruising or swelling noted at site.  Stable during access and blood draw.  Patient to remain accessed for treatment. 

## 2023-03-24 ENCOUNTER — Inpatient Hospital Stay: Payer: BC Managed Care – PPO

## 2023-03-24 VITALS — BP 126/66 | HR 84 | Temp 98.6°F | Resp 18

## 2023-03-24 DIAGNOSIS — Z95828 Presence of other vascular implants and grafts: Secondary | ICD-10-CM

## 2023-03-24 DIAGNOSIS — Z5111 Encounter for antineoplastic chemotherapy: Secondary | ICD-10-CM | POA: Diagnosis not present

## 2023-03-24 DIAGNOSIS — Z170 Estrogen receptor positive status [ER+]: Secondary | ICD-10-CM

## 2023-03-24 MED ORDER — PEGFILGRASTIM-JMDB 6 MG/0.6ML ~~LOC~~ SOSY
6.0000 mg | PREFILLED_SYRINGE | Freq: Once | SUBCUTANEOUS | Status: AC
  Administered 2023-03-24: 6 mg via SUBCUTANEOUS
  Filled 2023-03-24: qty 0.6

## 2023-03-24 NOTE — Patient Instructions (Addendum)
[facility]  Discharge Instructions: Thank you for choosing [facility] to provide your oncology and hematology care.  If you have a lab appointment with the Cancer Center - please note that after April 8th, 2024, all labs will be drawn in the cancer center.  You do not have to check in or register with the main entrance as you have in the past but will complete your check-in in the cancer center.  Wear comfortable clothing and clothing appropriate for easy access to any Portacath or PICC line.   We strive to give you quality time with your provider. You may need to reschedule your appointment if you arrive late (15 or more minutes).  Arriving late affects you and other patients whose appointments are after yours.  Also, if you miss three or more appointments without notifying the office, you may be dismissed from the clinic at the provider's discretion.      For prescription refill requests, have your pharmacy contact our office and allow 72 hours for refills to be completed.    Today you received the following fulphila injection   To help prevent nausea and vomiting after your treatment, we encourage you to take your nausea medication as directed.  BELOW ARE SYMPTOMS THAT SHOULD BE REPORTED IMMEDIATELY: *FEVER GREATER THAN 100.4 F (38 C) OR HIGHER *CHILLS OR SWEATING *NAUSEA AND VOMITING THAT IS NOT CONTROLLED WITH YOUR NAUSEA MEDICATION *UNUSUAL SHORTNESS OF BREATH *UNUSUAL BRUISING OR BLEEDING *URINARY PROBLEMS (pain or burning when urinating, or frequent urination) *BOWEL PROBLEMS (unusual diarrhea, constipation, pain near the anus) TENDERNESS IN MOUTH AND THROAT WITH OR WITHOUT PRESENCE OF ULCERS (sore throat, sores in mouth, or a toothache) UNUSUAL RASH, SWELLING OR PAIN  UNUSUAL VAGINAL DISCHARGE OR ITCHING   Items with * indicate a potential emergency and should be followed up as soon as possible or go to the Emergency Department if any problems  should occur.  Please show the CHEMOTHERAPY ALERT CARD or IMMUNOTHERAPY ALERT CARD at check-in to the Emergency Department and triage nurse.  Should you have questions after your visit or need to cancel or reschedule your appointment, please contact [facility] [phone]  and follow the prompts.  Office hours are 8:00 a.m. to 4:30 p.m. Monday - Friday. Please note that voicemails left after 4:00 p.m. may not be returned until the following business day.  We are closed weekends and major holidays. You have access to a nurse at all times for urgent questions. Please call the main number to the clinic [phone] and follow the prompts.  For any non-urgent questions, you may also contact your provider using MyChart. We now offer e-Visits for anyone 35 and older to request care online for non-urgent symptoms. For details visit [email or web address].   Also download the MyChart app! Go to the app store, search "MyChart", open the app, select [place], and log in with your MyChart username and password.

## 2023-03-24 NOTE — Progress Notes (Signed)
Fulphila injection given per orders. Patient tolerated it well without problems. Vitals stable and discharged home from clinic ambulatory. Follow up as scheduled.  

## 2023-03-26 ENCOUNTER — Other Ambulatory Visit: Payer: Self-pay

## 2023-04-03 ENCOUNTER — Other Ambulatory Visit: Payer: Self-pay

## 2023-04-04 ENCOUNTER — Inpatient Hospital Stay: Payer: BC Managed Care – PPO | Attending: Hematology | Admitting: Dietician

## 2023-04-04 DIAGNOSIS — Z170 Estrogen receptor positive status [ER+]: Secondary | ICD-10-CM | POA: Insufficient documentation

## 2023-04-04 DIAGNOSIS — G2581 Restless legs syndrome: Secondary | ICD-10-CM | POA: Insufficient documentation

## 2023-04-04 DIAGNOSIS — Z803 Family history of malignant neoplasm of breast: Secondary | ICD-10-CM | POA: Insufficient documentation

## 2023-04-04 DIAGNOSIS — R110 Nausea: Secondary | ICD-10-CM | POA: Insufficient documentation

## 2023-04-04 DIAGNOSIS — Z79899 Other long term (current) drug therapy: Secondary | ICD-10-CM | POA: Insufficient documentation

## 2023-04-04 DIAGNOSIS — Z7901 Long term (current) use of anticoagulants: Secondary | ICD-10-CM | POA: Insufficient documentation

## 2023-04-04 DIAGNOSIS — Z86711 Personal history of pulmonary embolism: Secondary | ICD-10-CM | POA: Insufficient documentation

## 2023-04-04 DIAGNOSIS — Z5189 Encounter for other specified aftercare: Secondary | ICD-10-CM | POA: Insufficient documentation

## 2023-04-04 DIAGNOSIS — Z5111 Encounter for antineoplastic chemotherapy: Secondary | ICD-10-CM | POA: Insufficient documentation

## 2023-04-04 DIAGNOSIS — C50412 Malignant neoplasm of upper-outer quadrant of left female breast: Secondary | ICD-10-CM | POA: Insufficient documentation

## 2023-04-04 DIAGNOSIS — K1230 Oral mucositis (ulcerative), unspecified: Secondary | ICD-10-CM | POA: Insufficient documentation

## 2023-04-04 DIAGNOSIS — K219 Gastro-esophageal reflux disease without esophagitis: Secondary | ICD-10-CM | POA: Insufficient documentation

## 2023-04-04 DIAGNOSIS — R519 Headache, unspecified: Secondary | ICD-10-CM | POA: Insufficient documentation

## 2023-04-04 DIAGNOSIS — Z801 Family history of malignant neoplasm of trachea, bronchus and lung: Secondary | ICD-10-CM | POA: Insufficient documentation

## 2023-04-04 NOTE — Progress Notes (Signed)
[facility] [address], [place] [address]    Clinic Day:  04/05/2023  Referring physician: [name], PA-C  Patient Care Team: [name], PA-C as PCP - General (Physician Assistant) [name], MD as Medical Oncologist (Medical Oncology)   ASSESSMENT & PLAN:   Assessment: 1.  Stage I (T1c N1 M0, G2, ER/PR+, HER2-) left breast UOQ IDC: - Screening mammogram (12/01/2022): Possible distortion in the left breast.  Right breast has no findings suspicious for malignancy. - Left breast diagnostic mammogram/US (12/14/2022): Irregular hypoechoic mass at the 1 o'clock position of the left breast, 4 cm from nipple, measuring 1 x 0.7 x 0.9 cm.  No pathologic lymphadenopathy in the left axilla. - Left breast biopsy (12/16/2022): Invasive ductal carcinoma, grade 2, ER/PR 100%, Ki-67 1%, HER2 0 by IHC. - Left breast lumpectomy and SLNB on 01/10/2023. - Pathology: 1.4 x 1.1 x 1.1 cm moderately differentiated IDC, grade 2, margins negative.  1 sentinel lymph node with metastatic carcinoma (1.1 mm).  1 lymph node with micrometastasis (1 mm).  3 other sentinel lymph nodes negative for invasive carcinoma.  PT1 cpN1a. - Oncotype DX recurrence score 14. - CT CAP (02/08/2023): Left Staebler 1.4 cm sclerotic lesion indeterminate.  No evidence of metastatic disease.  Right nephrolithiasis. - PET scan (02/10/2023): Postop changes in the left breast and axilla.  No uptake in the left established lesion. - 2D echo (02/16/2023): LVEF 55 to 60%. - Cycle 1 AC on 02/22/2023   2.  Social/family history: - She lives at home with her husband.  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  No prior history of smoking. - Paternal grandmother had breast cancer.  Brother had kidney cancer.  Father had prostate cancer.   3.  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  Probable infarct in  the left lung base. - Heterozygosity for factor V Leiden mutation.  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  PT mutation negative.  Protein C, protein S, AT III normal. - She has been on Xarelto since then.   4.  Bone health: - DEXA scan (01/04/2023): T-score -0.3, normal.    Plan: 1.  Stage IB (T1c N1 M0, G2, ER/PR+, HER2-) left breast UOQ IDC: - Cycle 3 was on 03/22/2023. - She reported burning sensation in the fingertips and feet on and off.  Feet has completely improved.  She also had pains in her feet which improved. - She complains of dry eyes.  Use artificial tears. - Reviewed labs today: Normal LFTs and CBC. - Proceed with cycle 4 today.  Will consider gabapentin if burning sensation gets worse.  RTC 2 weeks to start paclitaxel.   2.  Nausea prophylaxis: - Continue Phenergan as needed.  Use Zofran if Phenergan does not work.   3.  Restless leg syndrome: - Continue Requip 2 mg half tablet in the morning and whole tablet at nighttime.   4.  Acid reflux: - Continue Pepcid 20 mg twice daily.  Symptoms well-controlled.   5.  Mucositis: - Carafate is causing burning sensation.  She felt her tongue is raw.  She has trouble swallowing certain foods like breads and thick protein shakes. - She was told to sip on water frequently during daytime for dry mouth.    Orders Placed This Encounter  Procedures   Magnesium    Standing Status:   Future    Standing Expiration Date:   06/20/2024  CBC with Differential    Standing Status:   Future    Standing Expiration Date:   06/20/2024   Comprehensive metabolic panel    Standing Status:   Future    Standing Expiration Date:   06/20/2024   Magnesium    Standing Status:   Future    Standing Expiration Date:   06/27/2024   CBC with Differential    Standing Status:   Future    Standing Expiration Date:   06/27/2024   Comprehensive metabolic panel    Standing Status:   Future    Standing Expiration Date:   06/27/2024   Magnesium    Standing Status:   Future     Standing Expiration Date:   07/04/2024   CBC with Differential    Standing Status:   Future    Standing Expiration Date:   07/04/2024   Comprehensive metabolic panel    Standing Status:   Future    Standing Expiration Date:   07/04/2024      I,Katie Daubenspeck,acting as a scribe for [name], MD.,have documented all relevant documentation on the behalf of [name], MD,as directed by  [name], MD while in the presence of [name], MD.   I, [name] MD, have reviewed the above documentation for accuracy and completeness, and I agree with the above.   [name], MD   6/4/202411:10 AM  CHIEF COMPLAINT:   Diagnosis: left breast cancer    Cancer Staging  Breast cancer of upper-outer quadrant of left female breast [facility]) Staging form: Breast, AJCC 8th Edition - Clinical stage from 12/23/2022: Stage IB (cT1c, cN1(sn), cM0, G2, ER+, PR+, HER2-) - Unsigned    Prior Therapy: Lumpectomy and SLNB, 01/10/23   Current Therapy:  Dose dense AC followed by paclitaxel    HISTORY OF PRESENT ILLNESS:   Oncology History  Breast cancer of upper-outer quadrant of left female breast (HCC)  12/23/2022 Initial Diagnosis   Breast cancer of upper-outer quadrant of left female breast   02/22/2023 -  Chemotherapy   Patient is on Treatment Plan : BREAST ADJUVANT DOSE DENSE AC q14d / PACLitaxel q7d        INTERVAL HISTORY:   Judy Patton is a 52 y.o. female presenting to clinic today for follow up of left breast cancer. She was last seen by me on 03/22/23.  Today, she states that she is doing well overall. Her appetite level is at 50%. Her energy level is at 50%.  PAST MEDICAL HISTORY:   Past Medical History: Past Medical History:  Diagnosis Date   Anxiety    Chronic headaches    Depression    Factor V Leiden mutation (HCC)    Fatigue 02/18/2015   History of kidney stones    Hypothyroidism    MRSA (methicillin resistant  Staphylococcus aureus)    Pain of tooth socket 03/14/2015   Pancreatitis    PONV (postoperative nausea and vomiting)    Port-A-Cath in place 02/14/2023   Pulmonary embolus (HCC)    Restless leg syndrome    Shingles rash 03/14/2015   Shoulder injury    Stress    family   Weight gain 02/18/2015    Surgical History: Past Surgical History:  Procedure Laterality Date   BREAST BIOPSY Left 12/16/2022   [place] LT BREAST BX W LOC DEV 1ST LESION IMG BX SPEC US GUIDE 12/16/2022 [facility]   BREAST BIOPSY Left 01/04/2023   [place] LT RADIO FREQUENCY TAG LOC US GUIDE 01/04/2023 Edwin Cap, MD  [facility]   COLONOSCOPY  03/25/2004   WUJ:WJXB canal hemorrhoids, otherwise normal rectum,   DILITATION & CURRETTAGE/HYSTROSCOPY WITH NOVASURE ABLATION N/A 11/08/2017   Procedure: DILATATION & CURETTAGE/HYSTEROSCOPY WITH NOVASURE ENDOMETRIAL ABLATION;  Surgeon: Tilda Burrow, MD;  Location: [facility];  Service: Gynecology;  Laterality: N/A;   ESOPHAGOGASTRODUODENOSCOPY  03/25/2004   JYN:WGNFAO esophagus, small hiatal hernia, otherwise normal stomach   kidney stones  05/2014,11/2014   LAPAROSCOPIC BILATERAL SALPINGECTOMY Bilateral 11/08/2017   Procedure: LAPAROSCOPIC BILATERAL SALPINGECTOMY;  Surgeon: Tilda Burrow, MD;  Location: [facility];  Service: Gynecology;  Laterality: Bilateral;   LITHOTRIPSY  2008   PARTIAL MASTECTOMY WITH AXILLARY SENTINEL LYMPH NODE BIOPSY Left 01/10/2023   Procedure: PARTIAL MASTECTOMY WITH AXILLARY SENTINEL LYMPH NODE BIOPSY AND RADIOFREQUENCY TAG PLACEMENT;  Surgeon: Lucretia Roers, MD;  Location: [facility];  Service: General;  Laterality: Left;   PORTACATH PLACEMENT Right 02/11/2023   Procedure: INSERTION PORT-A-CATH;  Surgeon: Lucretia Roers, MD;  Location: [facility];  Service: General;  Laterality: Right;    Social History: Social History   Socioeconomic History   Marital status: Married    Spouse name: Not on file   Number of children: Not on file   Years of education:  Not on file   Highest education level: Not on file  Occupational History   Not on file  Tobacco Use   Smoking status: Never   Smokeless tobacco: Never  Vaping Use   Vaping Use: Never used  Substance and Sexual Activity   Alcohol use: Yes    Comment: occ   Drug use: No   Sexual activity: Not Currently    Birth control/protection: Surgical  Other Topics Concern   Not on file  Social History Narrative   Not on file   Social Determinants of Health   Financial Resource Strain: Not on file  Food Insecurity: No Food Insecurity (12/23/2022)   Hunger Vital Sign    Worried About Running Out of Food in the Last Year: Never true    Ran Out of Food in the Last Year: Never true  Transportation Needs: No Transportation Needs (12/23/2022)   PRAPARE - Transportation    Lack of Transportation (Medical): No    Lack of Transportation (Non-Medical): No  Physical Activity: Not on file  Stress: Not on file  Social Connections: Not on file  Intimate Partner Violence: Not At Risk (12/23/2022)   Humiliation, Afraid, Rape, and Kick questionnaire    Fear of Current or Ex-Partner: No    Emotionally Abused: No    Physically Abused: No    Sexually Abused: No    Family History: Family History  Problem Relation Age of Onset   Stroke Mother    CAD Mother    Prostate cancer Father 75   Kidney cancer Brother        dx 78s   Factor V Leiden deficiency Brother    Fibromyalgia Brother    Breast cancer Paternal Grandmother        dx >50   Lung cancer Paternal Grandfather     Current Medications:  Current Outpatient Medications:    acetaminophen (TYLENOL) 500 MG tablet, Take 1,000 mg by mouth every 6 (six) hours as needed for moderate pain or headache., Disp: , Rfl:    aluminum-magnesium hydroxide 200-200 MG/5ML suspension, Take 15 mLs by mouth 4 (four) times daily as needed for indigestion. Mix 1:1 with lidocaine and swish and swallow, Disp: 355 mL, Rfl: 0   CYCLOPHOSPHAMIDE IV, Inject into  the  vein every 14 (fourteen) days., Disp: , Rfl:    DOXOrubicin HCl (ADRIAMYCIN IV), Inject into the vein every 14 (fourteen) days., Disp: , Rfl:    ibuprofen (ADVIL,MOTRIN) 200 MG tablet, Take 400 mg by mouth every 6 (six) hours as needed for headache or moderate pain., Disp: , Rfl:    levothyroxine (SYNTHROID, LEVOTHROID) 88 MCG tablet, TAKE 88 MCG BY MOUTH IN THE MORNING, Disp: 90 tablet, Rfl: 3   lidocaine (XYLOCAINE) 2 % solution, Use as directed 15 mLs in the mouth or throat 4 (four) times daily as needed for mouth pain. Mix 1:1 with maalox and swish and swallow, Disp: 100 mL, Rfl: 0   lidocaine-prilocaine (EMLA) cream, Apply a quarter sized amount to port a cath site and cover with plastic wrap 1 hour prior to infusion appointments, Disp: 30 g, Rfl: 3   loratadine (CLARITIN) 10 MG tablet, Take 10 mg by mouth daily., Disp: , Rfl:    ondansetron (ZOFRAN) 4 MG tablet, Take 1 tablet (4 mg total) by mouth every 8 (eight) hours as needed., Disp: 30 tablet, Rfl: 1   oxyCODONE (ROXICODONE) 5 MG immediate release tablet, Take 1 tablet (5 mg total) by mouth every 4 (four) hours as needed for severe pain or breakthrough pain., Disp: 5 tablet, Rfl: 0   Pegfilgrastim-cbqv (UDENYCA Sadler), Inject into the skin every 14 (fourteen) days., Disp: , Rfl:    Polyethylene Glycol 400 (VISINE DRY EYE RELIEF OP), Place 1 drop into both eyes daily as needed (dry eye)., Disp: , Rfl:    promethazine (PHENERGAN) 25 MG tablet, Take 1 tablet (25 mg total) by mouth every 6 (six) hours as needed for nausea or vomiting., Disp: 30 tablet, Rfl: 3   rOPINIRole (REQUIP) 2 MG tablet, TAKE ONE TABLET BY MOUTH AT BEDTIME, Disp: 30 tablet, Rfl: 9   sucralfate (CARAFATE) 1 GM/10ML suspension, Take 10 mLs (1 g total) by mouth 4 (four) times daily -  with meals and at bedtime., Disp: 420 mL, Rfl: 0   TRINTELLIX 20 MG TABS tablet, Take 20 mg by mouth daily., Disp: , Rfl:    trolamine salicylate (ASPERCREME) 10 % cream, Apply 1 application  topically as needed for muscle pain., Disp: , Rfl:    XARELTO 20 MG TABS tablet, , Disp: , Rfl: 2   enoxaparin (LOVENOX) 40 MG/0.4ML injection, Inject 0.4 mLs (40 mg total) into the skin daily for 2 days., Disp: 0.8 mL, Rfl: 0 No current facility-administered medications for this visit.  Facility-Administered Medications Ordered in Other Visits:    cyclophosphamide (CYTOXAN) 1,200 mg in sodium chloride 0.9 % 250 mL chemo infusion, 600 mg/m2 (Treatment Plan Recorded), Intravenous, Once, [name], MD   DOXOrubicin (ADRIAMYCIN) chemo injection 120 mg, 60 mg/m2 (Treatment Plan Recorded), Intravenous, Once, [name], MD   heparin lock flush 100 unit/mL, 500 Units, Intracatheter, Once PRN, [name], MD   heparin lock flush 100 unit/mL, 250 Units, Intracatheter, Once PRN, [name], MD   Allergies: No Known Allergies  REVIEW OF SYSTEMS:   Review of Systems  Constitutional:  Negative for chills, fatigue and fever.  HENT:   Positive for trouble swallowing. Negative for lump/mass, mouth sores, nosebleeds and sore throat.   Eyes:  Negative for eye problems.  Respiratory:  Negative for cough and shortness of breath.   Cardiovascular:  Negative for chest pain, leg swelling and palpitations.  Gastrointestinal:  Positive for nausea. Negative for abdominal pain, constipation, diarrhea and vomiting.  Genitourinary:  Negative for  bladder incontinence, difficulty urinating, dysuria, frequency, hematuria and nocturia.   Musculoskeletal:  Negative for arthralgias, back pain, flank pain, myalgias and neck pain.  Skin:  Negative for itching and rash.  Neurological:  Positive for headaches. Negative for dizziness and numbness.  Hematological:  Does not bruise/bleed easily.  Psychiatric/Behavioral:  Negative for depression, sleep disturbance and suicidal ideas. The patient is not nervous/anxious.   All other systems reviewed and are negative.    VITALS:   Blood  pressure (!) 144/88, pulse 94, temperature (!) 97.3 F (36.3 C), temperature source Tympanic, resp. rate 20, weight 190 lb 12.8 oz (86.5 kg), SpO2 100 %.  Wt Readings from Last 3 Encounters:  04/05/23 190 lb 12.8 oz (86.5 kg)  03/22/23 193 lb 6.4 oz (87.7 kg)  03/08/23 196 lb 6.4 oz (89.1 kg)    Body mass index is 31.75 kg/m.  Performance status (ECOG): 1 - Symptomatic but completely ambulatory  PHYSICAL EXAM:   Physical Exam Vitals and nursing note reviewed. Exam conducted with a chaperone present.  Constitutional:      Appearance: Normal appearance.  Cardiovascular:     Rate and Rhythm: Normal rate and regular rhythm.     Pulses: Normal pulses.     Heart sounds: Normal heart sounds.  Pulmonary:     Effort: Pulmonary effort is normal.     Breath sounds: Normal breath sounds.  Abdominal:     Palpations: Abdomen is soft. There is no hepatomegaly, splenomegaly or mass.     Tenderness: There is no abdominal tenderness.  Musculoskeletal:     Right lower leg: No edema.     Left lower leg: No edema.  Lymphadenopathy:     Cervical: No cervical adenopathy.     Right cervical: No superficial, deep or posterior cervical adenopathy.    Left cervical: No superficial, deep or posterior cervical adenopathy.     Upper Body:     Right upper body: No supraclavicular or axillary adenopathy.     Left upper body: No supraclavicular or axillary adenopathy.  Neurological:     General: No focal deficit present.     Mental Status: She is alert and oriented to person, place, and time.  Psychiatric:        Mood and Affect: Mood normal.        Behavior: Behavior normal.     LABS:      Latest Ref Rng & Units 04/05/2023    8:22 AM 03/22/2023    9:12 AM 03/08/2023    8:32 AM  CBC  WBC 4.0 - 10.5 K/uL 10.6  9.7  10.3   Hemoglobin 12.0 - 15.0 g/dL 16.1  09.6  04.5   Hematocrit 36.0 - 46.0 % 32.6  34.7  40.5   Platelets 150 - 400 K/uL 183  150  192       Latest Ref Rng & Units 04/05/2023     8:22 AM 03/22/2023    9:12 AM 03/08/2023    8:32 AM  CMP  Glucose 70 - 99 mg/dL 409  811  914   BUN 6 - 20 mg/dL 13  11  14    Creatinine 0.44 - 1.00 mg/dL 7.82  9.56  2.13   Sodium 135 - 145 mmol/L 139  137  138   Potassium 3.5 - 5.1 mmol/L 3.4  3.4  3.6   Chloride 98 - 111 mmol/L 105  106  103   CO2 22 - 32 mmol/L 25  26  24  Calcium 8.9 - 10.3 mg/dL 8.9  8.4  8.8   Total Protein 6.5 - 8.1 g/dL 6.7  6.6  6.9   Total Bilirubin 0.3 - 1.2 mg/dL 0.5  0.3  0.5   Alkaline Phos 38 - 126 U/L 103  113  110   AST 15 - 41 U/L 26  20  26    ALT 0 - 44 U/L 26  17  18       No results found for: "CEA1", "CEA" / No results found for: "CEA1", "CEA" No results found for: "PSA1" No results found for: "ZOX096" No results found for: "CAN125"  No results found for: "TOTALPROTELP", "ALBUMINELP", "A1GS", "A2GS", "BETS", "BETA2SER", "GAMS", "MSPIKE", "SPEI" Lab Results  Component Value Date   FERRITIN 110 11/13/2013   FERRITIN 81 08/07/2013   No results found for: "LDH"   STUDIES:   No results found.

## 2023-04-04 NOTE — Progress Notes (Signed)
Nutrition Follow-up:  Patient with left breast cancer. She is receiving dose dense AC q14d (first 4/23).  Spoke with patient via telephone. She reports ongoing mucositis. States her gums are less sore. She reports tongue is bright red and feels raw. Patient having difficulty swallowing. This has been ongoing the last 3 weeks. Patient is using carafate which has helped some. Patient tolerating soft foods including yogurt, sweet potato, pudding, watergate salad, milk, water. She is trying to drink Ensure/Boost, however it is too thick at this time. Patient is using straw with liquids. Patient asking if the next chemotherapy planned will cause mouth sores.    Medications: reviewed   Labs: 5/21 - K 3.4, glucose 127  Anthropometrics: Last wt  193 lb 6.4 oz on 5/21 decreased   5/7 - 196 lb 6.4 oz  4/23 - 196 lb 12.8 oz    NUTRITION DIAGNOSIS: Food and nutrition related knowledge deficit continues    INTERVENTION:  Continue soft moist foods for ease of intake. Offered suggestions on high calorie high protein foods to try - handouts with ideas to be provided Suggested CIB powder mixed with milk as alternate ONS, recommend drinking 2-3/day - will provide samples  Educated on strategies for sore mouth, suggested baking soda salt water rinses several times daily - recipe provided ? Thrush - pt has appointment with MD 6/4 Handouts + samples left with nursing staff to give pt at 6/4 appt (pt aware)  MONITORING, EVALUATION, GOAL: weight trends, intake    NEXT VISIT: Monday July 1 via telephone

## 2023-04-05 ENCOUNTER — Inpatient Hospital Stay: Payer: BC Managed Care – PPO

## 2023-04-05 ENCOUNTER — Inpatient Hospital Stay: Payer: BC Managed Care – PPO | Admitting: Hematology

## 2023-04-05 ENCOUNTER — Inpatient Hospital Stay (HOSPITAL_BASED_OUTPATIENT_CLINIC_OR_DEPARTMENT_OTHER): Payer: BC Managed Care – PPO | Admitting: Hematology

## 2023-04-05 VITALS — BP 122/75 | HR 73 | Temp 97.2°F | Resp 16

## 2023-04-05 VITALS — BP 144/88 | HR 94 | Temp 97.3°F | Resp 20 | Wt 190.8 lb

## 2023-04-05 DIAGNOSIS — Z170 Estrogen receptor positive status [ER+]: Secondary | ICD-10-CM | POA: Diagnosis present

## 2023-04-05 DIAGNOSIS — Z7901 Long term (current) use of anticoagulants: Secondary | ICD-10-CM | POA: Diagnosis not present

## 2023-04-05 DIAGNOSIS — K1230 Oral mucositis (ulcerative), unspecified: Secondary | ICD-10-CM | POA: Diagnosis not present

## 2023-04-05 DIAGNOSIS — Z79899 Other long term (current) drug therapy: Secondary | ICD-10-CM | POA: Diagnosis not present

## 2023-04-05 DIAGNOSIS — G2581 Restless legs syndrome: Secondary | ICD-10-CM | POA: Diagnosis not present

## 2023-04-05 DIAGNOSIS — E876 Hypokalemia: Secondary | ICD-10-CM

## 2023-04-05 DIAGNOSIS — Z95828 Presence of other vascular implants and grafts: Secondary | ICD-10-CM

## 2023-04-05 DIAGNOSIS — Z5111 Encounter for antineoplastic chemotherapy: Secondary | ICD-10-CM | POA: Diagnosis present

## 2023-04-05 DIAGNOSIS — C50412 Malignant neoplasm of upper-outer quadrant of left female breast: Secondary | ICD-10-CM

## 2023-04-05 DIAGNOSIS — Z86711 Personal history of pulmonary embolism: Secondary | ICD-10-CM | POA: Diagnosis not present

## 2023-04-05 DIAGNOSIS — Z801 Family history of malignant neoplasm of trachea, bronchus and lung: Secondary | ICD-10-CM | POA: Diagnosis not present

## 2023-04-05 DIAGNOSIS — R519 Headache, unspecified: Secondary | ICD-10-CM | POA: Diagnosis not present

## 2023-04-05 DIAGNOSIS — R110 Nausea: Secondary | ICD-10-CM | POA: Diagnosis not present

## 2023-04-05 DIAGNOSIS — K219 Gastro-esophageal reflux disease without esophagitis: Secondary | ICD-10-CM | POA: Diagnosis not present

## 2023-04-05 DIAGNOSIS — Z5189 Encounter for other specified aftercare: Secondary | ICD-10-CM | POA: Diagnosis not present

## 2023-04-05 DIAGNOSIS — Z803 Family history of malignant neoplasm of breast: Secondary | ICD-10-CM | POA: Diagnosis not present

## 2023-04-05 LAB — CBC WITH DIFFERENTIAL/PLATELET
Abs Immature Granulocytes: 0.1 10*3/uL — ABNORMAL HIGH (ref 0.00–0.07)
Band Neutrophils: 7 %
Basophils Absolute: 0 10*3/uL (ref 0.0–0.1)
Basophils Relative: 0 %
Eosinophils Absolute: 0 10*3/uL (ref 0.0–0.5)
Eosinophils Relative: 0 %
HCT: 32.6 % — ABNORMAL LOW (ref 36.0–46.0)
Hemoglobin: 10.7 g/dL — ABNORMAL LOW (ref 12.0–15.0)
Lymphocytes Relative: 23 %
Lymphs Abs: 2.4 10*3/uL (ref 0.7–4.0)
MCH: 30.5 pg (ref 26.0–34.0)
MCHC: 32.8 g/dL (ref 30.0–36.0)
MCV: 92.9 fL (ref 80.0–100.0)
Metamyelocytes Relative: 1 %
Monocytes Absolute: 1 10*3/uL (ref 0.1–1.0)
Monocytes Relative: 9 %
Neutro Abs: 7.1 10*3/uL (ref 1.7–7.7)
Neutrophils Relative %: 60 %
Platelets: 183 10*3/uL (ref 150–400)
RBC: 3.51 MIL/uL — ABNORMAL LOW (ref 3.87–5.11)
RDW: 13.2 % (ref 11.5–15.5)
WBC: 10.6 10*3/uL — ABNORMAL HIGH (ref 4.0–10.5)
nRBC: 0 % (ref 0.0–0.2)

## 2023-04-05 LAB — COMPREHENSIVE METABOLIC PANEL
ALT: 26 U/L (ref 0–44)
AST: 26 U/L (ref 15–41)
Albumin: 3.6 g/dL (ref 3.5–5.0)
Alkaline Phosphatase: 103 U/L (ref 38–126)
Anion gap: 9 (ref 5–15)
BUN: 13 mg/dL (ref 6–20)
CO2: 25 mmol/L (ref 22–32)
Calcium: 8.9 mg/dL (ref 8.9–10.3)
Chloride: 105 mmol/L (ref 98–111)
Creatinine, Ser: 0.61 mg/dL (ref 0.44–1.00)
GFR, Estimated: >60 mL/min (ref >60)
Glucose, Bld: 137 mg/dL — ABNORMAL HIGH (ref 70–99)
Potassium: 3.4 mmol/L — ABNORMAL LOW (ref 3.5–5.1)
Sodium: 139 mmol/L (ref 135–145)
Total Bilirubin: 0.5 mg/dL (ref 0.3–1.2)
Total Protein: 6.7 g/dL (ref 6.5–8.1)

## 2023-04-05 LAB — MAGNESIUM: Magnesium: 1.9 mg/dL (ref 1.7–2.4)

## 2023-04-05 MED ORDER — SODIUM CHLORIDE 0.9% FLUSH
10.0000 mL | Freq: Once | INTRAVENOUS | Status: AC
  Administered 2023-04-05: 10 mL via INTRAVENOUS

## 2023-04-05 MED ORDER — HEPARIN SOD (PORK) LOCK FLUSH 100 UNIT/ML IV SOLN: 250.0000 [IU] | Freq: Once | INTRAVENOUS | Status: DC | PRN

## 2023-04-05 MED ORDER — SODIUM CHLORIDE 0.9 % IV SOLN
10.0000 mg | Freq: Once | INTRAVENOUS | Status: AC
  Administered 2023-04-05: 10 mg via INTRAVENOUS
  Filled 2023-04-05: qty 1

## 2023-04-05 MED ORDER — SODIUM CHLORIDE 0.9% FLUSH
10.0000 mL | INTRAVENOUS | Status: DC | PRN
  Administered 2023-04-05: 10 mL

## 2023-04-05 MED ORDER — DOXORUBICIN HCL CHEMO IV INJECTION 2 MG/ML
60.0000 mg/m2 | Freq: Once | INTRAVENOUS | Status: AC
  Administered 2023-04-05: 120 mg via INTRAVENOUS
  Filled 2023-04-05: qty 60

## 2023-04-05 MED ORDER — POTASSIUM CHLORIDE CRYS ER 20 MEQ PO TBCR
40.0000 meq | EXTENDED_RELEASE_TABLET | Freq: Once | ORAL | Status: AC
  Administered 2023-04-05: 40 meq via ORAL
  Filled 2023-04-05: qty 2

## 2023-04-05 MED ORDER — SODIUM CHLORIDE 0.9 % IV SOLN: Freq: Once | INTRAVENOUS | Status: AC

## 2023-04-05 MED ORDER — SODIUM CHLORIDE 0.9 % IV SOLN
150.0000 mg | Freq: Once | INTRAVENOUS | Status: AC
  Administered 2023-04-05: 150 mg via INTRAVENOUS
  Filled 2023-04-05: qty 150

## 2023-04-05 MED ORDER — PALONOSETRON HCL INJECTION 0.25 MG/5ML
0.2500 mg | Freq: Once | INTRAVENOUS | Status: AC
  Administered 2023-04-05: 0.25 mg via INTRAVENOUS
  Filled 2023-04-05: qty 5

## 2023-04-05 MED ORDER — HEPARIN SOD (PORK) LOCK FLUSH 100 UNIT/ML IV SOLN
500.0000 [IU] | Freq: Once | INTRAVENOUS | Status: AC | PRN
  Administered 2023-04-05: 500 [IU]

## 2023-04-05 MED ORDER — SODIUM CHLORIDE 0.9 % IV SOLN
600.0000 mg/m2 | Freq: Once | INTRAVENOUS | Status: AC
  Administered 2023-04-05: 1200 mg via INTRAVENOUS
  Filled 2023-04-05: qty 60

## 2023-04-05 NOTE — Patient Instructions (Signed)
[facility]  Discharge Instructions: Thank you for choosing [facility] to provide your oncology and hematology care.  If you have a lab appointment with the Cancer Center - please note that after April 8th, 2024, all labs will be drawn in the cancer center.  You do not have to check in or register with the main entrance as you have in the past but will complete your check-in in the cancer center.  Wear comfortable clothing and clothing appropriate for easy access to any Portacath or PICC line.   We strive to give you quality time with your provider. You may need to reschedule your appointment if you arrive late (15 or more minutes).  Arriving late affects you and other patients whose appointments are after yours.  Also, if you miss three or more appointments without notifying the office, you may be dismissed from the clinic at the provider's discretion.      For prescription refill requests, have your pharmacy contact our office and allow 72 hours for refills to be completed.    Today you received the following chemotherapy and/or immunotherapy agents Doxorubicin and Cyclophosamide.  Cyclophosphamide Injection What is this medication? CYCLOPHOSPHAMIDE (sye kloe FOSS fa mide) treats some types of cancer. It works by slowing down the growth of cancer cells. This medicine may be used for other purposes; ask your health care provider or pharmacist if you have questions. COMMON BRAND NAME(S): Cyclophosphamide, Cytoxan, Neosar What should I tell my care team before I take this medication? They need to know if you have any of these conditions: Heart disease Irregular heartbeat or rhythm Infection Kidney problems Liver disease Low blood cell levels (white cells, platelets, or red blood cells) Lung disease Previous radiation Trouble passing urine An unusual or allergic reaction to cyclophosphamide, other medications, foods, dyes, or preservatives Pregnant or  trying to get pregnant Breast-feeding How should I use this medication? This medication is injected into a vein. It is given by your care team in a hospital or clinic setting. Talk to your care team about the use of this medication in children. Special care may be needed. Overdosage: If you think you have taken too much of this medicine contact a poison control center or emergency room at once. NOTE: This medicine is only for you. Do not share this medicine with others. What if I miss a dose? Keep appointments for follow-up doses. It is important not to miss your dose. Call your care team if you are unable to keep an appointment. What may interact with this medication? Amphotericin B Amiodarone Azathioprine Certain antivirals for HIV or hepatitis Certain medications for blood pressure, such as enalapril, lisinopril, quinapril Cyclosporine Diuretics Etanercept Indomethacin Medications that relax muscles Metronidazole Natalizumab Tamoxifen Warfarin This list may not describe all possible interactions. Give your health care provider a list of all the medicines, herbs, non-prescription drugs, or dietary supplements you use. Also tell them if you smoke, drink alcohol, or use illegal drugs. Some items may interact with your medicine. What should I watch for while using this medication? This medication may make you feel generally unwell. This is not uncommon as chemotherapy can affect healthy cells as well as cancer cells. Report any side effects. Continue your course of treatment even though you feel ill unless your care team tells you to stop. You may need blood work while you are taking this medication. This medication may increase your risk of getting an infection. Call your care team for  advice if you get a fever, chills, sore throat, or other symptoms of a cold or flu. Do not treat yourself. Try to avoid being around people who are sick. Avoid taking medications that contain aspirin,  acetaminophen, ibuprofen, naproxen, or ketoprofen unless instructed by your care team. These medications may hide a fever. Be careful brushing or flossing your teeth or using a toothpick because you may get an infection or bleed more easily. If you have any dental work done, tell your dentist you are receiving this medication. Drink water or other fluids as directed. Urinate often, even at night. Some products may contain alcohol. Ask your care team if this medication contains alcohol. Be sure to tell all care teams you are taking this medicine. Certain medicines, like metronidazole and disulfiram, can cause an unpleasant reaction when taken with alcohol. The reaction includes flushing, headache, nausea, vomiting, sweating, and increased thirst. The reaction can last from 30 minutes to several hours. Talk to your care team if you wish to become pregnant or think you might be pregnant. This medication can cause serious birth defects if taken during pregnancy and for 1 year after the last dose. A negative pregnancy test is required before starting this medication. A reliable form of contraception is recommended while taking this medication and for 1 year after the last dose. Talk to your care team about reliable forms of contraception. Do not father a child while taking this medication and for 4 months after the last dose. Use a condom during this time period. Do not breast-feed while taking this medication or for 1 week after the last dose. This medication may cause infertility. Talk to your care team if you are concerned about your fertility. Talk to your care team about your risk of cancer. You may be more at risk for certain types of cancer if you take this medication. What side effects may I notice from receiving this medication? Side effects that you should report to your care team as soon as possible: Allergic reactions--skin rash, itching, hives, swelling of the face, lips, tongue, or throat Dry  cough, shortness of breath or trouble breathing Heart failure--shortness of breath, swelling of the ankles, feet, or hands, sudden weight gain, unusual weakness or fatigue Heart muscle inflammation--unusual weakness or fatigue, shortness of breath, chest pain, fast or irregular heartbeat, dizziness, swelling of the ankles, feet, or hands Heart rhythm changes--fast or irregular heartbeat, dizziness, feeling faint or lightheaded, chest pain, trouble breathing Infection--fever, chills, cough, sore throat, wounds that don't heal, pain or trouble when passing urine, general feeling of discomfort or being unwell Kidney injury--decrease in the amount of urine, swelling of the ankles, hands, or feet Liver injury--right upper belly pain, loss of appetite, nausea, light-colored stool, dark yellow or brown urine, yellowing skin or eyes, unusual weakness or fatigue Low red blood cell level--unusual weakness or fatigue, dizziness, headache, trouble breathing Low sodium level--muscle weakness, fatigue, dizziness, headache, confusion Red or dark brown urine Unusual bruising or bleeding Side effects that usually do not require medical attention (report to your care team if they continue or are bothersome): Hair loss Irregular menstrual cycles or spotting Loss of appetite Nausea Pain, redness, or swelling with sores inside the mouth or throat Vomiting This list may not describe all possible side effects. Call your doctor for medical advice about side effects. You may report side effects to FDA at 1-800-FDA-1088. Where should I keep my medication? This medication is given in a hospital or clinic. It will not  be stored at home. NOTE: This sheet is a summary. It may not cover all possible information. If you have questions about this medicine, talk to your doctor, pharmacist, or health care provider.  2024 Elsevier/Gold Standard (2022-03-05 00:00:00)    2024 Elsevier/Gold Standard (2022-09-08  00:00:00) oxorubicin and cyclophosomide.  Doxorubicin Injection What is this medication? DOXORUBICIN (dox oh ROO bi sin) treats some types of cancer. It works by slowing down the growth of cancer cells. This medicine may be used for other purposes; ask your health care provider or pharmacist if you have questions. COMMON BRAND NAME(S): Adriamycin, Adriamycin PFS, Adriamycin RDF, Rubex What should I tell my care team before I take this medication? They need to know if you have any of these conditions: Heart disease History of low blood cell levels caused by a medication Liver disease Recent or ongoing radiation An unusual or allergic reaction to doxorubicin, other medications, foods, dyes, or preservatives If you or your partner are pregnant or trying to get pregnant Breast-feeding How should I use this medication? This medication is injected into a vein. It is given by your care team in a hospital or clinic setting. Talk to your care team about the use of this medication in children. Special care may be needed. Overdosage: If you think you have taken too much of this medicine contact a poison control center or emergency room at once. NOTE: This medicine is only for you. Do not share this medicine with others. What if I miss a dose? Keep appointments for follow-up doses. It is important not to miss your dose. Call your care team if you are unable to keep an appointment. What may interact with this medication? 6-mercaptopurine Paclitaxel Phenytoin St. John's wort Trastuzumab Verapamil This list may not describe all possible interactions. Give your health care provider a list of all the medicines, herbs, non-prescription drugs, or dietary supplements you use. Also tell them if you smoke, drink alcohol, or use illegal drugs. Some items may interact with your medicine. What should I watch for while using this medication? Your condition will be monitored carefully while you are receiving  this medication. You may need blood work while taking this medication. This medication may make you feel generally unwell. This is not uncommon as chemotherapy can affect healthy cells as well as cancer cells. Report any side effects. Continue your course of treatment even though you feel ill unless your care team tells you to stop. There is a maximum amount of this medication you should receive throughout your life. The amount depends on the medical condition being treated and your overall health. Your care team will watch how much of this medication you receive. Tell your care team if you have taken this medication before. Your urine may turn red for a few days after your dose. This is not blood. If your urine is dark or brown, call your care team. In some cases, you may be given additional medications to help with side effects. Follow all directions for their use. This medication may increase your risk of getting an infection. Call your care team for advice if you get a fever, chills, sore throat, or other symptoms of a cold or flu. Do not treat yourself. Try to avoid being around people who are sick. This medication may increase your risk to bruise or bleed. Call your care team if you notice any unusual bleeding. Talk to your care team about your risk of cancer. You may be more at risk for  certain types of cancers if you take this medication. You should make sure that you get enough Coenzyme Q10 while you are taking this medication. Discuss the foods you eat and the vitamins you take with your care team. Talk to your care team if you or your partner may be pregnant. Serious birth defects can occur if you take this medication during pregnancy and for 6 months after the last dose. Contraception is recommended while taking this medication and for 6 months after the last dose. Your care team can help you find the option that works for you. If your partner can get pregnant, use a condom while taking this  medication and for 6 months after the last dose. Do not breastfeed while taking this medication. This medication may cause infertility. Talk to your care team if you are concerned about your fertility. What side effects may I notice from receiving this medication? Side effects that you should report to your care team as soon as possible: Allergic reactions--skin rash, itching, hives, swelling of the face, lips, tongue, or throat Heart failure--shortness of breath, swelling of the ankles, feet, or hands, sudden weight gain, unusual weakness or fatigue Heart rhythm changes--fast or irregular heartbeat, dizziness, feeling faint or lightheaded, chest pain, trouble breathing Infection--fever, chills, cough, sore throat, wounds that don't heal, pain or trouble when passing urine, general feeling of discomfort or being unwell Low red blood cell level--unusual weakness or fatigue, dizziness, headache, trouble breathing Painful swelling, warmth, or redness of the skin, blisters or sores at the infusion site Unusual bruising or bleeding Side effects that usually do not require medical attention (report to your care team if they continue or are bothersome): Diarrhea Hair loss Nausea Pain, redness, or swelling with sores inside the mouth or throat Red urine This list may not describe all possible side effects. Call your doctor for medical advice about side effects. You may report side effects to FDA at 1-800-FDA-1088. Where should I keep my medication? This medication is given in a hospital or clinic. It will not be stored at home. NOTE: This sheet is a summary. It may not cover all possible information. If you have questions about this medicine, talk to your doctor, pharmacist, or health care provider.  2024 Elsevier/Gold Standard (2022-02-25 00:00:00)   To help prevent nausea and vomiting after your treatment, we encourage you to take your nausea medication as directed.  BELOW ARE SYMPTOMS THAT  SHOULD BE REPORTED IMMEDIATELY: *FEVER GREATER THAN 100.4 F (38 C) OR HIGHER *CHILLS OR SWEATING *NAUSEA AND VOMITING THAT IS NOT CONTROLLED WITH YOUR NAUSEA MEDICATION *UNUSUAL SHORTNESS OF BREATH *UNUSUAL BRUISING OR BLEEDING *URINARY PROBLEMS (pain or burning when urinating, or frequent urination) *BOWEL PROBLEMS (unusual diarrhea, constipation, pain near the anus) TENDERNESS IN MOUTH AND THROAT WITH OR WITHOUT PRESENCE OF ULCERS (sore throat, sores in mouth, or a toothache) UNUSUAL RASH, SWELLING OR PAIN  UNUSUAL VAGINAL DISCHARGE OR ITCHING   Items with * indicate a potential emergency and should be followed up as soon as possible or go to the Emergency Department if any problems should occur.  Please show the CHEMOTHERAPY ALERT CARD or IMMUNOTHERAPY ALERT CARD at check-in to the Emergency Department and triage nurse.  Should you have questions after your visit or need to cancel or reschedule your appointment, please contact [facility] [phone]  and follow the prompts.  Office hours are 8:00 a.m. to 4:30 p.m. Monday - Friday. Please note that voicemails left after 4:00 p.m. may  not be returned until the following business day.  We are closed weekends and major holidays. You have access to a nurse at all times for urgent questions. Please call the main number to the clinic [phone] and follow the prompts.  For any non-urgent questions, you may also contact your provider using MyChart. We now offer e-Visits for anyone 67 and older to request care online for non-urgent symptoms. For details visit [email or web address].   Also download the MyChart app! Go to the app store, search "MyChart", open the app, select [place], and log in with your MyChart username and password.

## 2023-04-05 NOTE — Progress Notes (Signed)
Patient has been examined by Dr. Katragadda. Vital signs and labs have been reviewed by MD - ANC, Creatinine, LFTs, hemoglobin, and platelets are within treatment parameters per M.D. - pt may proceed with treatment.  Primary RN and pharmacy notified.  

## 2023-04-05 NOTE — Progress Notes (Signed)
Patient presents today for chemotherapy infusion. Patient is in satisfactory condition with no new complaints voiced.  Vital signs are stable.  Labs reviewed by Dr. Ellin Saba during the office visit and all labs are within treatment parameters. Potassium 3.4, of potassium given PO per Dr Marice Potter orders. We will proceed with treatment per MD orders.   Patient tolerated treatment well with no complaints voiced.  Patient left ambulatory in stable condition.  Vital signs stable at discharge.  Follow up as scheduled.

## 2023-04-05 NOTE — Patient Instructions (Signed)
[facility] at [facility] Discharge Instructions   You were seen and examined today by Dr. Katragadda.  He reviewed the results of your lab work which are normal/stable.   We will proceed with your treatment today.  Return as scheduled.    Thank you for choosing [facility] at [facility] to provide your oncology and hematology care.  To afford each patient quality time with our provider, please arrive at least 15 minutes before your scheduled appointment time.   If you have a lab appointment with the Cancer Center please come in thru the Main Entrance and check in at the main information desk.  You need to re-schedule your appointment should you arrive 10 or more minutes late.  We strive to give you quality time with our providers, and arriving late affects you and other patients whose appointments are after yours.  Also, if you no show three or more times for appointments you may be dismissed from the clinic at the providers discretion.     Again, thank you for choosing [facility].  Our hope is that these requests will decrease the amount of time that you wait before being seen by our physicians.       _____________________________________________________________  Should you have questions after your visit to [facility], please contact our office at [phone] and follow the prompts.  Our office hours are 8:00 a.m. and 4:30 p.m. Monday - Friday.  Please note that voicemails left after 4:00 p.m. may not be returned until the following business day.  We are closed weekends and major holidays.  You do have access to a nurse 24-7, just call the main number to the clinic [phone] and do not press any options, hold on the line and a nurse will answer the phone.    For prescription refill requests, have your pharmacy contact our office and allow 72 hours.    Due to Covid, you will need to wear a mask upon entering  the hospital. If you do not have a mask, a mask will be given to you at the Main Entrance upon arrival. For doctor visits, patients may have 1 support person age 18 or older with them. For treatment visits, patients can not have anyone with them due to social distancing guidelines and our immunocompromised population.      

## 2023-04-07 ENCOUNTER — Inpatient Hospital Stay: Payer: BC Managed Care – PPO

## 2023-04-07 VITALS — BP 133/72 | HR 78 | Temp 98.6°F | Resp 18

## 2023-04-07 DIAGNOSIS — Z5111 Encounter for antineoplastic chemotherapy: Secondary | ICD-10-CM | POA: Diagnosis not present

## 2023-04-07 DIAGNOSIS — Z170 Estrogen receptor positive status [ER+]: Secondary | ICD-10-CM

## 2023-04-07 DIAGNOSIS — Z95828 Presence of other vascular implants and grafts: Secondary | ICD-10-CM

## 2023-04-07 MED ORDER — PEGFILGRASTIM-JMDB 6 MG/0.6ML ~~LOC~~ SOSY
6.0000 mg | PREFILLED_SYRINGE | Freq: Once | SUBCUTANEOUS | Status: AC
  Administered 2023-04-07: 6 mg via SUBCUTANEOUS
  Filled 2023-04-07: qty 0.6

## 2023-04-07 NOTE — Patient Instructions (Signed)
[facility]  Discharge Instructions: Thank you for choosing [facility] to provide your oncology and hematology care.  If you have a lab appointment with the Cancer Center - please note that after April 8th, 2024, all labs will be drawn in the cancer center.  You do not have to check in or register with the main entrance as you have in the past but will complete your check-in in the cancer center.  Wear comfortable clothing and clothing appropriate for easy access to any Portacath or PICC line.   We strive to give you quality time with your provider. You may need to reschedule your appointment if you arrive late (15 or more minutes).  Arriving late affects you and other patients whose appointments are after yours.  Also, if you miss three or more appointments without notifying the office, you may be dismissed from the clinic at the provider's discretion.      For prescription refill requests, have your pharmacy contact our office and allow 72 hours for refills to be completed.    Today you received the following [id number], return as scheduled.   To help prevent nausea and vomiting after your treatment, we encourage you to take your nausea medication as directed.  BELOW ARE SYMPTOMS THAT SHOULD BE REPORTED IMMEDIATELY: *FEVER GREATER THAN 100.4 F (38 C) OR HIGHER *CHILLS OR SWEATING *NAUSEA AND VOMITING THAT IS NOT CONTROLLED WITH YOUR NAUSEA MEDICATION *UNUSUAL SHORTNESS OF BREATH *UNUSUAL BRUISING OR BLEEDING *URINARY PROBLEMS (pain or burning when urinating, or frequent urination) *BOWEL PROBLEMS (unusual diarrhea, constipation, pain near the anus) TENDERNESS IN MOUTH AND THROAT WITH OR WITHOUT PRESENCE OF ULCERS (sore throat, sores in mouth, or a toothache) UNUSUAL RASH, SWELLING OR PAIN  UNUSUAL VAGINAL DISCHARGE OR ITCHING   Items with * indicate a potential emergency and should be followed up as soon as possible or go to the Emergency  Department if any problems should occur.  Please show the CHEMOTHERAPY ALERT CARD or IMMUNOTHERAPY ALERT CARD at check-in to the Emergency Department and triage nurse.  Should you have questions after your visit or need to cancel or reschedule your appointment, please contact [facility] AT [facility] [phone]  and follow the prompts.  Office hours are 8:00 a.m. to 4:30 p.m. Monday - Friday. Please note that voicemails left after 4:00 p.m. may not be returned until the following business day.  We are closed weekends and major holidays. You have access to a nurse at all times for urgent questions. Please call the main number to the clinic [phone] and follow the prompts.  For any non-urgent questions, you may also contact your provider using MyChart. We now offer e-Visits for anyone 45 and older to request care online for non-urgent symptoms. For details visit [email or web address].   Also download the MyChart app! Go to the app store, search "MyChart", open the app, select [place], and log in with your MyChart username and password.

## 2023-04-07 NOTE — Progress Notes (Signed)
Patient tolerated injection with no complaints voiced. Site clean and dry with no bruising or swelling noted at site. See MAR for details. Band aid applied.  Patient stable during and after injection. VSS with discharge and left in satisfactory condition with no s/s of distress noted.  

## 2023-04-14 ENCOUNTER — Telehealth: Payer: BC Managed Care – PPO | Admitting: Licensed Clinical Social Worker

## 2023-04-18 NOTE — Progress Notes (Signed)
[facility] [address] [place] [address]    Clinic Day:  04/19/2023  Referring physician: [name], PA-C  Patient Care Team: [name], PA-C as PCP - General (Physician Assistant) [name], MD as Medical Oncologist (Medical Oncology)   ASSESSMENT & PLAN:   Assessment: 1.  Stage I (T1c N1 M0, G2, ER/PR+, HER2-) left breast UOQ IDC: - Screening mammogram (12/01/2022): Possible distortion in the left breast.  Right breast has no findings suspicious for malignancy. - Left breast diagnostic mammogram/US (12/14/2022): Irregular hypoechoic mass at the 1 o'clock position of the left breast, 4 cm from nipple, measuring 1 x 0.7 x 0.9 cm.  No pathologic lymphadenopathy in the left axilla. - Left breast biopsy (12/16/2022): Invasive ductal carcinoma, grade 2, ER/PR 100%, Ki-67 1%, HER2 0 by IHC. - Left breast lumpectomy and SLNB on 01/10/2023. - Pathology: 1.4 x 1.1 x 1.1 cm moderately differentiated IDC, grade 2, margins negative.  1 sentinel lymph node with metastatic carcinoma (1.1 mm).  1 lymph node with micrometastasis (1 mm).  3 other sentinel lymph nodes negative for invasive carcinoma.  PT1 cpN1a. - Oncotype DX recurrence score 14. - CT CAP (02/08/2023): Left Staebler 1.4 cm sclerotic lesion indeterminate.  No evidence of metastatic disease.  Right nephrolithiasis. - PET scan (02/10/2023): Postop changes in the left breast and axilla.  No uptake in the left established lesion. - 2D echo (02/16/2023): LVEF 55 to 60%. - Cycle 1 AC on 02/22/2023   2.  Social/family history: - She lives at home with her husband.  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  No prior history of smoking. - Paternal grandmother had breast cancer.  Brother had kidney cancer.  Father had prostate cancer.   3.  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  Probable infarct in  the left lung base. - Heterozygosity for factor V Leiden mutation.  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  PT mutation negative.  Protein C, protein S, AT III normal. - She has been on Xarelto since then.   4.  Bone health: - DEXA scan (01/04/2023): T-score -0.3, normal.    Plan: 1.  Stage IB (T1c N1 M0, G2, ER/PR+, HER2-) left breast UOQ IDC: - She has completed 4 cycles of AC. - She has some sensitivity of the tongue. - She had dysphagia to thick liquids and bread.  No thrush in the mouth.  Will closely monitor. - Labs today: Normal LFTs and creatinine.  Mild hypokalemia and hypomagnesemia.  CBC grossly normal with mild anemia. - She will start her weekly Taxol today.  We discussed side effects in detail. - We will reevaluate her in 2 weeks.   2.  Nausea prophylaxis: - Continue Phenergan as needed.  Use Zofran if Phenergan does not work.   3.  Restless leg syndrome: - Continue Requip 2 mg half tablet in the morning and whole tablet at nighttime.   4.  Acid reflux: - Continue Pepcid 20 mg twice daily.  Symptoms well-controlled.   5.  Mucositis: - She complains of burning sensation, predominantly of the tongue.-She has used Carafate and Magic mouthwash in the past which did not help. - She is very sensitive to toothpaste.  I have recommended using [employer] [place] toothpaste.    No orders of the defined types were placed in this encounter.     I,Katie Daubenspeck,acting as a Neurosurgeon for [name], MD.,have  documented all relevant documentation on the behalf of [name], MD,as directed by  [name], MD while in the presence of [name], MD.   I, [name] MD, have reviewed the above documentation for accuracy and completeness, and I agree with the above.   [name], MD   6/18/20241:05 PM  CHIEF COMPLAINT:   Diagnosis: left breast cancer    Cancer Staging  Breast cancer of upper-outer quadrant of left female breast  [facility]) Staging form: Breast, AJCC 8th Edition - Clinical stage from 12/23/2022: Stage IB (cT1c, cN1(sn), cM0, G2, ER+, PR+, HER2-) - Unsigned    Prior Therapy: Lumpectomy and SLNB, 01/10/23   Current Therapy:  Dose dense AC followed by paclitaxel    HISTORY OF PRESENT ILLNESS:   Oncology History  Breast cancer of upper-outer quadrant of left female breast (HCC)  12/23/2022 Initial Diagnosis   Breast cancer of upper-outer quadrant of left female breast   02/22/2023 -  Chemotherapy   Patient is on Treatment Plan : BREAST ADJUVANT DOSE DENSE AC q14d / PACLitaxel q7d        INTERVAL HISTORY:   Judy Patton is a 52 y.o. female presenting to clinic today for follow up of left breast cancer. She was last seen by me on 04/05/23.  Today, she states that she is doing well overall. Her appetite level is at 25%. Her energy level is at 50%.  PAST MEDICAL HISTORY:   Past Medical History: Past Medical History:  Diagnosis Date   Anxiety    Chronic headaches    Depression    Factor V Leiden mutation (HCC)    Fatigue 02/18/2015   History of kidney stones    Hypothyroidism    MRSA (methicillin resistant Staphylococcus aureus)    Pain of tooth socket 03/14/2015   Pancreatitis    PONV (postoperative nausea and vomiting)    Port-A-Cath in place 02/14/2023   Pulmonary embolus (HCC)    Restless leg syndrome    Shingles rash 03/14/2015   Shoulder injury    Stress    family   Weight gain 02/18/2015    Surgical History: Past Surgical History:  Procedure Laterality Date   BREAST BIOPSY Left 12/16/2022   [place] LT BREAST BX W LOC DEV 1ST LESION IMG BX SPEC US GUIDE 12/16/2022 [facility]   BREAST BIOPSY Left 01/04/2023   [place] LT RADIO FREQUENCY TAG LOC US GUIDE 01/04/2023 Edwin Cap, MD [facility]   COLONOSCOPY  03/25/2004   ZOX:WRUE canal hemorrhoids, otherwise normal rectum,   DILITATION & CURRETTAGE/HYSTROSCOPY WITH NOVASURE ABLATION N/A 11/08/2017   Procedure: DILATATION &  CURETTAGE/HYSTEROSCOPY WITH NOVASURE ENDOMETRIAL ABLATION;  Surgeon: Tilda Burrow, MD;  Location: [facility];  Service: Gynecology;  Laterality: N/A;   ESOPHAGOGASTRODUODENOSCOPY  03/25/2004   AVW:UJWJXB esophagus, small hiatal hernia, otherwise normal stomach   kidney stones  05/2014,11/2014   LAPAROSCOPIC BILATERAL SALPINGECTOMY Bilateral 11/08/2017   Procedure: LAPAROSCOPIC BILATERAL SALPINGECTOMY;  Surgeon: Tilda Burrow, MD;  Location: [facility];  Service: Gynecology;  Laterality: Bilateral;   LITHOTRIPSY  2008   PARTIAL MASTECTOMY WITH AXILLARY SENTINEL LYMPH NODE BIOPSY Left 01/10/2023   Procedure: PARTIAL MASTECTOMY WITH AXILLARY SENTINEL LYMPH NODE BIOPSY AND RADIOFREQUENCY TAG PLACEMENT;  Surgeon: Lucretia Roers, MD;  Location: [facility];  Service: General;  Laterality: Left;   PORTACATH PLACEMENT Right 02/11/2023   Procedure: INSERTION PORT-A-CATH;  Surgeon: Lucretia Roers, MD;  Location: [facility];  Service: General;  Laterality: Right;    Social History: Social History  Socioeconomic History   Marital status: Married    Spouse name: Not on file   Number of children: Not on file   Years of education: Not on file   Highest education level: Not on file  Occupational History   Not on file  Tobacco Use   Smoking status: Never   Smokeless tobacco: Never  Vaping Use   Vaping Use: Never used  Substance and Sexual Activity   Alcohol use: Yes    Comment: occ   Drug use: No   Sexual activity: Not Currently    Birth control/protection: Surgical  Other Topics Concern   Not on file  Social History Narrative   Not on file   Social Determinants of Health   Financial Resource Strain: Not on file  Food Insecurity: No Food Insecurity (12/23/2022)   Hunger Vital Sign    Worried About Running Out of Food in the Last Year: Never true    Ran Out of Food in the Last Year: Never true  Transportation Needs: No Transportation Needs (12/23/2022)   PRAPARE - Transportation    Lack of  Transportation (Medical): No    Lack of Transportation (Non-Medical): No  Physical Activity: Not on file  Stress: Not on file  Social Connections: Not on file  Intimate Partner Violence: Not At Risk (12/23/2022)   Humiliation, Afraid, Rape, and Kick questionnaire    Fear of Current or Ex-Partner: No    Emotionally Abused: No    Physically Abused: No    Sexually Abused: No    Family History: Family History  Problem Relation Age of Onset   Stroke Mother    CAD Mother    Prostate cancer Father 30   Kidney cancer Brother        dx 19s   Factor V Leiden deficiency Brother    Fibromyalgia Brother    Breast cancer Paternal Grandmother        dx >50   Lung cancer Paternal Grandfather     Current Medications:  Current Outpatient Medications:    acetaminophen (TYLENOL) 500 MG tablet, Take 1,000 mg by mouth every 6 (six) hours as needed for moderate pain or headache., Disp: , Rfl:    aluminum-magnesium hydroxide 200-200 MG/5ML suspension, Take 15 mLs by mouth 4 (four) times daily as needed for indigestion. Mix 1:1 with lidocaine and swish and swallow, Disp: 355 mL, Rfl: 0   CYCLOPHOSPHAMIDE IV, Inject into the vein every 14 (fourteen) days., Disp: , Rfl:    DOXOrubicin HCl (ADRIAMYCIN IV), Inject into the vein every 14 (fourteen) days., Disp: , Rfl:    ibuprofen (ADVIL,MOTRIN) 200 MG tablet, Take 400 mg by mouth every 6 (six) hours as needed for headache or moderate pain., Disp: , Rfl:    levothyroxine (SYNTHROID, LEVOTHROID) 88 MCG tablet, TAKE 88 MCG BY MOUTH IN THE MORNING, Disp: 90 tablet, Rfl: 3   lidocaine (XYLOCAINE) 2 % solution, Use as directed 15 mLs in the mouth or throat 4 (four) times daily as needed for mouth pain. Mix 1:1 with maalox and swish and swallow, Disp: 100 mL, Rfl: 0   lidocaine-prilocaine (EMLA) cream, Apply a quarter sized amount to port a cath site and cover with plastic wrap 1 hour prior to infusion appointments, Disp: 30 g, Rfl: 3   loratadine (CLARITIN) 10 MG  tablet, Take 10 mg by mouth daily., Disp: , Rfl:    ondansetron (ZOFRAN) 4 MG tablet, Take 1 tablet (4 mg total) by mouth every 8 (eight) hours as  needed., Disp: 30 tablet, Rfl: 1   oxyCODONE (ROXICODONE) 5 MG immediate release tablet, Take 1 tablet (5 mg total) by mouth every 4 (four) hours as needed for severe pain or breakthrough pain., Disp: 5 tablet, Rfl: 0   Pegfilgrastim-cbqv (UDENYCA [place]), Inject into the skin every 14 (fourteen) days., Disp: , Rfl:    Polyethylene Glycol 400 (VISINE DRY EYE RELIEF OP), Place 1 drop into both eyes daily as needed (dry eye)., Disp: , Rfl:    promethazine (PHENERGAN) 25 MG tablet, Take 1 tablet (25 mg total) by mouth every 6 (six) hours as needed for nausea or vomiting., Disp: 30 tablet, Rfl: 3   rOPINIRole (REQUIP) 2 MG tablet, TAKE ONE TABLET BY MOUTH AT BEDTIME, Disp: 30 tablet, Rfl: 9   sucralfate (CARAFATE) 1 GM/10ML suspension, Take 10 mLs (1 g total) by mouth 4 (four) times daily -  with meals and at bedtime., Disp: 420 mL, Rfl: 0   TRINTELLIX 20 MG TABS tablet, Take 20 mg by mouth daily., Disp: , Rfl:    trolamine salicylate (ASPERCREME) 10 % cream, Apply 1 application topically as needed for muscle pain., Disp: , Rfl:    XARELTO 20 MG TABS tablet, , Disp: , Rfl: 2   enoxaparin (LOVENOX) 40 MG/0.4ML injection, Inject 0.4 mLs (40 mg total) into the skin daily for 2 days., Disp: 0.8 mL, Rfl: 0 No current facility-administered medications for this visit.  Facility-Administered Medications Ordered in Other Visits:    heparin lock flush 100 unit/mL, 500 Units, Intracatheter, Once PRN, [name], MD   PACLitaxel (TAXOL) 162 mg in sodium chloride 0.9 % 250 mL chemo infusion (</= 80mg /m2), 80 mg/m2 (Treatment Plan Recorded), Intravenous, Once, [name], MD, Last Rate: 277 mL/hr at 04/19/23 1212, 162 mg at 04/19/23 1212   sodium chloride flush (NS) 0.9 % injection 10 mL, 10 mL, Intracatheter, PRN, [name], MD    Allergies: No Known Allergies  REVIEW OF SYSTEMS:   Review of Systems  Constitutional:  Negative for chills, fatigue and fever.  HENT:   Positive for mouth sores and trouble swallowing. Negative for lump/mass, nosebleeds and sore throat.   Eyes:  Negative for eye problems.  Respiratory:  Positive for shortness of breath. Negative for cough.   Cardiovascular:  Negative for chest pain, leg swelling and palpitations.  Gastrointestinal:  Negative for abdominal pain, constipation, diarrhea, nausea and vomiting.  Genitourinary:  Negative for bladder incontinence, difficulty urinating, dysuria, frequency, hematuria and nocturia.   Musculoskeletal:  Negative for arthralgias, back pain, flank pain, myalgias and neck pain.  Skin:  Negative for itching and rash.  Neurological:  Negative for dizziness, headaches and numbness.  Hematological:  Does not bruise/bleed easily.  Psychiatric/Behavioral:  Positive for depression. Negative for sleep disturbance and suicidal ideas. The patient is nervous/anxious.   All other systems reviewed and are negative.    VITALS:   Blood pressure (!) 116/54, pulse 87, temperature 98.8 F (37.1 C), temperature source Tympanic, resp. rate 18, weight 187 lb 9.6 oz (85.1 kg), SpO2 100 %.  Wt Readings from Last 3 Encounters:  04/19/23 187 lb 9.6 oz (85.1 kg)  04/19/23 187 lb 6.4 oz (85 kg)  04/05/23 190 lb 12.8 oz (86.5 kg)    Body mass index is 31.22 kg/m.  Performance status (ECOG): 1 - Symptomatic but completely ambulatory  PHYSICAL EXAM:   Physical Exam Vitals and nursing note reviewed. Exam conducted with a chaperone present.  Constitutional:      Appearance: Normal appearance.  Cardiovascular:     Rate and Rhythm: Normal rate and regular rhythm.     Pulses: Normal pulses.     Heart sounds: Normal heart sounds.  Pulmonary:     Effort: Pulmonary effort is normal.     Breath sounds: Normal breath sounds.  Abdominal:     Palpations: Abdomen is  soft. There is no hepatomegaly, splenomegaly or mass.     Tenderness: There is no abdominal tenderness.  Musculoskeletal:     Right lower leg: No edema.     Left lower leg: No edema.  Lymphadenopathy:     Cervical: No cervical adenopathy.     Right cervical: No superficial, deep or posterior cervical adenopathy.    Left cervical: No superficial, deep or posterior cervical adenopathy.     Upper Body:     Right upper body: No supraclavicular or axillary adenopathy.     Left upper body: No supraclavicular or axillary adenopathy.  Neurological:     General: No focal deficit present.     Mental Status: She is alert and oriented to person, place, and time.  Psychiatric:        Mood and Affect: Mood normal.        Behavior: Behavior normal.     LABS:      Latest Ref Rng & Units 04/19/2023    9:27 AM 04/05/2023    8:22 AM 03/22/2023    9:12 AM  CBC  WBC 4.0 - 10.5 K/uL 7.9  10.6  9.7   Hemoglobin 12.0 - 15.0 g/dL 9.2  16.1  09.6   Hematocrit 36.0 - 46.0 % 27.7  32.6  34.7   Platelets 150 - 400 K/uL 167  183  150       Latest Ref Rng & Units 04/19/2023    9:27 AM 04/05/2023    8:22 AM 03/22/2023    9:12 AM  CMP  Glucose 70 - 99 mg/dL 045  409  811   BUN 6 - 20 mg/dL 10  13  11    Creatinine 0.44 - 1.00 mg/dL 9.14  7.82  9.56   Sodium 135 - 145 mmol/L 137  139  137   Potassium 3.5 - 5.1 mmol/L 3.2  3.4  3.4   Chloride 98 - 111 mmol/L 104  105  106   CO2 22 - 32 mmol/L 25  25  26    Calcium 8.9 - 10.3 mg/dL 8.4  8.9  8.4   Total Protein 6.5 - 8.1 g/dL 6.6  6.7  6.6   Total Bilirubin 0.3 - 1.2 mg/dL 0.6  0.5  0.3   Alkaline Phos 38 - 126 U/L 99  103  113   AST 15 - 41 U/L 25  26  20    ALT 0 - 44 U/L 22  26  17       No results found for: "CEA1", "CEA" / No results found for: "CEA1", "CEA" No results found for: "PSA1" No results found for: "OZH086" No results found for: "CAN125"  No results found for: "TOTALPROTELP", "ALBUMINELP", "A1GS", "A2GS", "BETS", "BETA2SER", "GAMS",  "MSPIKE", "SPEI" Lab Results  Component Value Date   FERRITIN 110 11/13/2013   FERRITIN 81 08/07/2013   No results found for: "LDH"   STUDIES:   No results found.

## 2023-04-19 ENCOUNTER — Inpatient Hospital Stay (HOSPITAL_BASED_OUTPATIENT_CLINIC_OR_DEPARTMENT_OTHER): Payer: BC Managed Care – PPO | Admitting: Hematology

## 2023-04-19 ENCOUNTER — Inpatient Hospital Stay: Payer: BC Managed Care – PPO

## 2023-04-19 VITALS — BP 120/72 | HR 71 | Temp 98.2°F | Resp 16

## 2023-04-19 DIAGNOSIS — Z170 Estrogen receptor positive status [ER+]: Secondary | ICD-10-CM

## 2023-04-19 DIAGNOSIS — Z95828 Presence of other vascular implants and grafts: Secondary | ICD-10-CM

## 2023-04-19 DIAGNOSIS — Z5111 Encounter for antineoplastic chemotherapy: Secondary | ICD-10-CM | POA: Diagnosis not present

## 2023-04-19 DIAGNOSIS — C50412 Malignant neoplasm of upper-outer quadrant of left female breast: Secondary | ICD-10-CM

## 2023-04-19 LAB — CBC WITH DIFFERENTIAL/PLATELET
Abs Immature Granulocytes: 0.39 10*3/uL — ABNORMAL HIGH (ref 0.00–0.07)
Basophils Absolute: 0.1 10*3/uL (ref 0.0–0.1)
Basophils Relative: 1 %
Eosinophils Absolute: 0 10*3/uL (ref 0.0–0.5)
Eosinophils Relative: 0 %
HCT: 27.7 % — ABNORMAL LOW (ref 36.0–46.0)
Hemoglobin: 9.2 g/dL — ABNORMAL LOW (ref 12.0–15.0)
Immature Granulocytes: 5 %
Lymphocytes Relative: 14 %
Lymphs Abs: 1.1 10*3/uL (ref 0.7–4.0)
MCH: 31 pg (ref 26.0–34.0)
MCHC: 33.2 g/dL (ref 30.0–36.0)
MCV: 93.3 fL (ref 80.0–100.0)
Monocytes Absolute: 1.1 10*3/uL — ABNORMAL HIGH (ref 0.1–1.0)
Monocytes Relative: 14 %
Neutro Abs: 5.2 10*3/uL (ref 1.7–7.7)
Neutrophils Relative %: 66 %
Platelets: 167 10*3/uL (ref 150–400)
RBC: 2.97 MIL/uL — ABNORMAL LOW (ref 3.87–5.11)
RDW: 15.9 % — ABNORMAL HIGH (ref 11.5–15.5)
WBC: 7.9 10*3/uL (ref 4.0–10.5)
nRBC: 0 % (ref 0.0–0.2)

## 2023-04-19 LAB — COMPREHENSIVE METABOLIC PANEL
ALT: 22 U/L (ref 0–44)
AST: 25 U/L (ref 15–41)
Albumin: 3.5 g/dL (ref 3.5–5.0)
Alkaline Phosphatase: 99 U/L (ref 38–126)
Anion gap: 8 (ref 5–15)
BUN: 10 mg/dL (ref 6–20)
CO2: 25 mmol/L (ref 22–32)
Calcium: 8.4 mg/dL — ABNORMAL LOW (ref 8.9–10.3)
Chloride: 104 mmol/L (ref 98–111)
Creatinine, Ser: 0.56 mg/dL (ref 0.44–1.00)
GFR, Estimated: >60 mL/min (ref >60)
Glucose, Bld: 119 mg/dL — ABNORMAL HIGH (ref 70–99)
Potassium: 3.2 mmol/L — ABNORMAL LOW (ref 3.5–5.1)
Sodium: 137 mmol/L (ref 135–145)
Total Bilirubin: 0.6 mg/dL (ref 0.3–1.2)
Total Protein: 6.6 g/dL (ref 6.5–8.1)

## 2023-04-19 LAB — MAGNESIUM: Magnesium: 1.6 mg/dL — ABNORMAL LOW (ref 1.7–2.4)

## 2023-04-19 MED ORDER — POTASSIUM CHLORIDE CRYS ER 20 MEQ PO TBCR
20.0000 meq | EXTENDED_RELEASE_TABLET | Freq: Once | ORAL | Status: AC
  Administered 2023-04-19: 20 meq via ORAL
  Filled 2023-04-19: qty 1

## 2023-04-19 MED ORDER — DIPHENHYDRAMINE HCL 50 MG/ML IJ SOLN: 50.0000 mg | Freq: Once | INTRAMUSCULAR | Status: DC

## 2023-04-19 MED ORDER — SODIUM CHLORIDE 0.9 % IV SOLN
10.0000 mg | Freq: Once | INTRAVENOUS | Status: AC
  Administered 2023-04-19: 10 mg via INTRAVENOUS
  Filled 2023-04-19: qty 1

## 2023-04-19 MED ORDER — CETIRIZINE HCL 10 MG/ML IV SOLN
10.0000 mg | Freq: Once | INTRAVENOUS | Status: AC
  Administered 2023-04-19: 10 mg via INTRAVENOUS
  Filled 2023-04-19: qty 1

## 2023-04-19 MED ORDER — SODIUM CHLORIDE 0.9% FLUSH
10.0000 mL | INTRAVENOUS | Status: DC | PRN
  Administered 2023-04-19: 10 mL via INTRAVENOUS

## 2023-04-19 MED ORDER — SODIUM CHLORIDE 0.9 % IV SOLN: Freq: Once | INTRAVENOUS | Status: AC

## 2023-04-19 MED ORDER — SODIUM CHLORIDE 0.9% FLUSH
10.0000 mL | INTRAVENOUS | Status: DC | PRN
  Administered 2023-04-19: 10 mL

## 2023-04-19 MED ORDER — MAGNESIUM SULFATE 2 GM/50ML IV SOLN
2.0000 g | Freq: Once | INTRAVENOUS | Status: AC
  Administered 2023-04-19: 2 g via INTRAVENOUS
  Filled 2023-04-19: qty 50

## 2023-04-19 MED ORDER — SODIUM CHLORIDE 0.9 % IV SOLN
80.0000 mg/m2 | Freq: Once | INTRAVENOUS | Status: AC
  Administered 2023-04-19: 162 mg via INTRAVENOUS
  Filled 2023-04-19: qty 27

## 2023-04-19 MED ORDER — HEPARIN SOD (PORK) LOCK FLUSH 100 UNIT/ML IV SOLN
500.0000 [IU] | Freq: Once | INTRAVENOUS | Status: AC | PRN
  Administered 2023-04-19: 500 [IU]

## 2023-04-19 MED ORDER — FAMOTIDINE IN NACL 20-0.9 MG/50ML-% IV SOLN
20.0000 mg | Freq: Once | INTRAVENOUS | Status: AC
  Administered 2023-04-19: 20 mg via INTRAVENOUS
  Filled 2023-04-19: qty 50

## 2023-04-19 NOTE — Progress Notes (Signed)
Labs reviewed with MD today at office visit. Ok to treat per MD. Will give Magnesium and potassium per MD orders.   Treatment given per orders. Patient tolerated it well without problems. Vitals stable and discharged home from clinic ambulatory. Follow up as scheduled.

## 2023-04-19 NOTE — Progress Notes (Signed)
Discontinue diphenhydramine from oncology treatment plan --> Add Quzyttir (cetirizine) 10 mg IVPush x 1 as premedication for oncology treatment plan.  T.O. Dr Katragadda/Elvy Mclarty, PharmD  

## 2023-04-19 NOTE — Progress Notes (Signed)
Patients port flushed without difficulty.  Good blood return noted with no bruising or swelling noted at site.  VSS. Patient remains accessed for treatment.  

## 2023-04-19 NOTE — Patient Instructions (Addendum)
[facility] at [facility] Discharge Instructions   You were seen and examined today by Dr. Ellin Saba.  He reviewed the results of your lab work which are mostly normal/stable. Your potassium and magnesium are low. We will give you potassium pills and some IV magnesium along with your treatment today.   We will proceed with your treatment today.   Return as scheduled.    Thank you for choosing [facility] at [facility] to provide your oncology and hematology care.  To afford each patient quality time with our provider, please arrive at least 15 minutes before your scheduled appointment time.   If you have a lab appointment with the Cancer Center please come in thru the Main Entrance and check in at the main information desk.  You need to re-schedule your appointment should you arrive 10 or more minutes late.  We strive to give you quality time with our providers, and arriving late affects you and other patients whose appointments are after yours.  Also, if you no show three or more times for appointments you may be dismissed from the clinic at the providers discretion.     Again, thank you for choosing [facility].  Our hope is that these requests will decrease the amount of time that you wait before being seen by our physicians.       _____________________________________________________________  Should you have questions after your visit to [facility], please contact our office at [phone] and follow the prompts.  Our office hours are 8:00 a.m. and 4:30 p.m. Monday - Friday.  Please note that voicemails left after 4:00 p.m. may not be returned until the following business day.  We are closed weekends and major holidays.  You do have access to a nurse 24-7, just call the main number to the clinic [phone] and do not press any options, hold on the line and a nurse will answer the phone.    For prescription  refill requests, have your pharmacy contact our office and allow 72 hours.    Due to Covid, you will need to wear a mask upon entering the hospital. If you do not have a mask, a mask will be given to you at the Main Entrance upon arrival. For doctor visits, patients may have 1 support person age 42 or older with them. For treatment visits, patients can not have anyone with them due to social distancing guidelines and our immunocompromised population.

## 2023-04-19 NOTE — Progress Notes (Signed)
Patient has been examined by Dr. Katragadda. Vital signs and labs have been reviewed by MD - ANC, Creatinine, LFTs, hemoglobin, and platelets are within treatment parameters per M.D. - pt may proceed with treatment.  Primary RN and pharmacy notified.  

## 2023-04-19 NOTE — Patient Instructions (Signed)
[facility]  Discharge Instructions: Thank you for choosing [facility] to provide your oncology and hematology care.  If you have a lab appointment with the Cancer Center - please note that after April 8th, 2024, all labs will be drawn in the cancer center.  You do not have to check in or register with the main entrance as you have in the past but will complete your check-in in the cancer center.  Wear comfortable clothing and clothing appropriate for easy access to any Portacath or PICC line.   We strive to give you quality time with your provider. You may need to reschedule your appointment if you arrive late (15 or more minutes).  Arriving late affects you and other patients whose appointments are after yours.  Also, if you miss three or more appointments without notifying the office, you may be dismissed from the clinic at the provider's discretion.      For prescription refill requests, have your pharmacy contact our office and allow 72 hours for refills to be completed.    Today you received the following chemotherapy and/or immunotherapy agents Paclitaxel    To help prevent nausea and vomiting after your treatment, we encourage you to take your nausea medication as directed.  BELOW ARE SYMPTOMS THAT SHOULD BE REPORTED IMMEDIATELY: *FEVER GREATER THAN 100.4 F (38 C) OR HIGHER *CHILLS OR SWEATING *NAUSEA AND VOMITING THAT IS NOT CONTROLLED WITH YOUR NAUSEA MEDICATION *UNUSUAL SHORTNESS OF BREATH *UNUSUAL BRUISING OR BLEEDING *URINARY PROBLEMS (pain or burning when urinating, or frequent urination) *BOWEL PROBLEMS (unusual diarrhea, constipation, pain near the anus) TENDERNESS IN MOUTH AND THROAT WITH OR WITHOUT PRESENCE OF ULCERS (sore throat, sores in mouth, or a toothache) UNUSUAL RASH, SWELLING OR PAIN  UNUSUAL VAGINAL DISCHARGE OR ITCHING   Items with * indicate a potential emergency and should be followed up as soon as possible or go to the  Emergency Department if any problems should occur.  Please show the CHEMOTHERAPY ALERT CARD or IMMUNOTHERAPY ALERT CARD at check-in to the Emergency Department and triage nurse.  Should you have questions after your visit or need to cancel or reschedule your appointment, please contact [facility] [phone]  and follow the prompts.  Office hours are 8:00 a.m. to 4:30 p.m. Monday - Friday. Please note that voicemails left after 4:00 p.m. may not be returned until the following business day.  We are closed weekends and major holidays. You have access to a nurse at all times for urgent questions. Please call the main number to the clinic [phone] and follow the prompts.  For any non-urgent questions, you may also contact your provider using MyChart. We now offer e-Visits for anyone 70 and older to request care online for non-urgent symptoms. For details visit [email or web address].   Also download the MyChart app! Go to the app store, search "MyChart", open the app, select [place], and log in with your MyChart username and password.

## 2023-04-21 ENCOUNTER — Telehealth: Payer: Self-pay

## 2023-04-21 NOTE — Telephone Encounter (Signed)
Chemotherapy follow up call.  Patient did well with no complaints.  Stated she noticed her hands are hurting a little more but this is not new for her.  She will try OTC and will call us back if no relief.

## 2023-04-26 ENCOUNTER — Other Ambulatory Visit: Payer: Self-pay

## 2023-04-26 ENCOUNTER — Inpatient Hospital Stay: Payer: BC Managed Care – PPO

## 2023-04-26 ENCOUNTER — Inpatient Hospital Stay: Payer: BC Managed Care – PPO | Admitting: Hematology

## 2023-04-26 VITALS — BP 122/79 | HR 87 | Temp 97.8°F | Resp 18 | Wt 187.8 lb

## 2023-04-26 DIAGNOSIS — Z5111 Encounter for antineoplastic chemotherapy: Secondary | ICD-10-CM | POA: Diagnosis not present

## 2023-04-26 DIAGNOSIS — Z95828 Presence of other vascular implants and grafts: Secondary | ICD-10-CM

## 2023-04-26 DIAGNOSIS — C50412 Malignant neoplasm of upper-outer quadrant of left female breast: Secondary | ICD-10-CM

## 2023-04-26 LAB — CBC WITH DIFFERENTIAL/PLATELET
Abs Immature Granulocytes: 0.06 10*3/uL (ref 0.00–0.07)
Basophils Absolute: 0.1 10*3/uL (ref 0.0–0.1)
Basophils Relative: 2 %
Eosinophils Absolute: 0 10*3/uL (ref 0.0–0.5)
Eosinophils Relative: 1 %
HCT: 26 % — ABNORMAL LOW (ref 36.0–46.0)
Hemoglobin: 8.7 g/dL — ABNORMAL LOW (ref 12.0–15.0)
Immature Granulocytes: 1 %
Lymphocytes Relative: 15 %
Lymphs Abs: 1 10*3/uL (ref 0.7–4.0)
MCH: 31.4 pg (ref 26.0–34.0)
MCHC: 33.5 g/dL (ref 30.0–36.0)
MCV: 93.9 fL (ref 80.0–100.0)
Monocytes Absolute: 0.7 10*3/uL (ref 0.1–1.0)
Monocytes Relative: 11 %
Neutro Abs: 4.7 10*3/uL (ref 1.7–7.7)
Neutrophils Relative %: 70 %
Platelets: 309 10*3/uL (ref 150–400)
RBC: 2.77 MIL/uL — ABNORMAL LOW (ref 3.87–5.11)
RDW: 16.6 % — ABNORMAL HIGH (ref 11.5–15.5)
WBC: 6.6 10*3/uL (ref 4.0–10.5)
nRBC: 0 % (ref 0.0–0.2)

## 2023-04-26 LAB — COMPREHENSIVE METABOLIC PANEL
ALT: 28 U/L (ref 0–44)
AST: 28 U/L (ref 15–41)
Albumin: 3.6 g/dL (ref 3.5–5.0)
Alkaline Phosphatase: 80 U/L (ref 38–126)
Anion gap: 7 (ref 5–15)
BUN: 12 mg/dL (ref 6–20)
CO2: 24 mmol/L (ref 22–32)
Calcium: 8.6 mg/dL — ABNORMAL LOW (ref 8.9–10.3)
Chloride: 107 mmol/L (ref 98–111)
Creatinine, Ser: 0.58 mg/dL (ref 0.44–1.00)
GFR, Estimated: >60 mL/min (ref >60)
Glucose, Bld: 121 mg/dL — ABNORMAL HIGH (ref 70–99)
Potassium: 3.3 mmol/L — ABNORMAL LOW (ref 3.5–5.1)
Sodium: 138 mmol/L (ref 135–145)
Total Bilirubin: 0.5 mg/dL (ref 0.3–1.2)
Total Protein: 6.5 g/dL (ref 6.5–8.1)

## 2023-04-26 LAB — MAGNESIUM: Magnesium: 1.7 mg/dL (ref 1.7–2.4)

## 2023-04-26 MED ORDER — SODIUM CHLORIDE 0.9% FLUSH
10.0000 mL | Freq: Once | INTRAVENOUS | Status: AC
  Administered 2023-04-26: 10 mL via INTRAVENOUS

## 2023-04-26 MED ORDER — CETIRIZINE HCL 10 MG/ML IV SOLN
10.0000 mg | Freq: Once | INTRAVENOUS | Status: AC
  Administered 2023-04-26: 10 mg via INTRAVENOUS
  Filled 2023-04-26: qty 1

## 2023-04-26 MED ORDER — SODIUM CHLORIDE 0.9% FLUSH
10.0000 mL | INTRAVENOUS | Status: DC | PRN
  Administered 2023-04-26: 10 mL

## 2023-04-26 MED ORDER — FAMOTIDINE IN NACL 20-0.9 MG/50ML-% IV SOLN
20.0000 mg | Freq: Once | INTRAVENOUS | Status: AC
  Administered 2023-04-26: 20 mg via INTRAVENOUS
  Filled 2023-04-26: qty 50

## 2023-04-26 MED ORDER — SODIUM CHLORIDE 0.9 % IV SOLN
10.0000 mg | Freq: Once | INTRAVENOUS | Status: AC
  Administered 2023-04-26: 10 mg via INTRAVENOUS
  Filled 2023-04-26: qty 10

## 2023-04-26 MED ORDER — HEPARIN SOD (PORK) LOCK FLUSH 100 UNIT/ML IV SOLN
500.0000 [IU] | Freq: Once | INTRAVENOUS | Status: AC | PRN
  Administered 2023-04-26: 500 [IU]

## 2023-04-26 MED ORDER — SODIUM CHLORIDE 0.9 % IV SOLN
80.0000 mg/m2 | Freq: Once | INTRAVENOUS | Status: AC
  Administered 2023-04-26: 162 mg via INTRAVENOUS
  Filled 2023-04-26: qty 27

## 2023-04-26 MED ORDER — SODIUM CHLORIDE 0.9 % IV SOLN: Freq: Once | INTRAVENOUS | Status: AC

## 2023-04-26 MED ORDER — POTASSIUM CHLORIDE CRYS ER 20 MEQ PO TBCR
40.0000 meq | EXTENDED_RELEASE_TABLET | Freq: Once | ORAL | Status: AC
  Administered 2023-04-26: 40 meq via ORAL
  Filled 2023-04-26: qty 2

## 2023-04-26 NOTE — Patient Instructions (Signed)
[facility]  Discharge Instructions: Thank you for choosing [facility] to provide your oncology and hematology care.  If you have a lab appointment with the Cancer Center - please note that after April 8th, 2024, all labs will be drawn in the cancer center.  You do not have to check in or register with the main entrance as you have in the past but will complete your check-in in the cancer center.  Wear comfortable clothing and clothing appropriate for easy access to any Portacath or PICC line.   We strive to give you quality time with your provider. You may need to reschedule your appointment if you arrive late (15 or more minutes).  Arriving late affects you and other patients whose appointments are after yours.  Also, if you miss three or more appointments without notifying the office, you may be dismissed from the clinic at the provider's discretion.      For prescription refill requests, have your pharmacy contact our office and allow 72 hours for refills to be completed.    Today you received the following chemotherapy and/or immunotherapy agents Taxol      To help prevent nausea and vomiting after your treatment, we encourage you to take your nausea medication as directed.  BELOW ARE SYMPTOMS THAT SHOULD BE REPORTED IMMEDIATELY: *FEVER GREATER THAN 100.4 F (38 C) OR HIGHER *CHILLS OR SWEATING *NAUSEA AND VOMITING THAT IS NOT CONTROLLED WITH YOUR NAUSEA MEDICATION *UNUSUAL SHORTNESS OF BREATH *UNUSUAL BRUISING OR BLEEDING *URINARY PROBLEMS (pain or burning when urinating, or frequent urination) *BOWEL PROBLEMS (unusual diarrhea, constipation, pain near the anus) TENDERNESS IN MOUTH AND THROAT WITH OR WITHOUT PRESENCE OF ULCERS (sore throat, sores in mouth, or a toothache) UNUSUAL RASH, SWELLING OR PAIN  UNUSUAL VAGINAL DISCHARGE OR ITCHING   Items with * indicate a potential emergency and should be followed up as soon as possible or go to the  Emergency Department if any problems should occur.  Please show the CHEMOTHERAPY ALERT CARD or IMMUNOTHERAPY ALERT CARD at check-in to the Emergency Department and triage nurse.  Should you have questions after your visit or need to cancel or reschedule your appointment, please contact [facility] [phone]  and follow the prompts.  Office hours are 8:00 a.m. to 4:30 p.m. Monday - Friday. Please note that voicemails left after 4:00 p.m. may not be returned until the following business day.  We are closed weekends and major holidays. You have access to a nurse at all times for urgent questions. Please call the main number to the clinic [phone] and follow the prompts.  For any non-urgent questions, you may also contact your provider using MyChart. We now offer e-Visits for anyone 5 and older to request care online for non-urgent symptoms. For details visit [email or web address].   Also download the MyChart app! Go to the app store, search "MyChart", open the app, select [place], and log in with your MyChart username and password.

## 2023-04-26 NOTE — Progress Notes (Unsigned)
Patient presents today for Taxol infusion per providers order.  Vital signs and labs within parameters for treatment.  Patient has no new complaints at this time.  Treatment given today per MD orders.  Stable during infusion without adverse affects.  Vital signs stable.  No complaints at this time.  Discharge from clinic ambulatory in stable condition.  Alert and oriented X 3.  Follow up with [facility] as scheduled.  

## 2023-04-27 ENCOUNTER — Encounter: Payer: Self-pay | Admitting: Hematology

## 2023-05-02 ENCOUNTER — Telehealth: Payer: Self-pay | Admitting: Dietician

## 2023-05-02 ENCOUNTER — Inpatient Hospital Stay: Payer: BC Managed Care – PPO | Attending: Hematology | Admitting: Dietician

## 2023-05-02 DIAGNOSIS — C50412 Malignant neoplasm of upper-outer quadrant of left female breast: Secondary | ICD-10-CM | POA: Insufficient documentation

## 2023-05-02 DIAGNOSIS — Z79899 Other long term (current) drug therapy: Secondary | ICD-10-CM | POA: Insufficient documentation

## 2023-05-02 DIAGNOSIS — Z803 Family history of malignant neoplasm of breast: Secondary | ICD-10-CM | POA: Insufficient documentation

## 2023-05-02 DIAGNOSIS — Z86711 Personal history of pulmonary embolism: Secondary | ICD-10-CM | POA: Insufficient documentation

## 2023-05-02 DIAGNOSIS — E876 Hypokalemia: Secondary | ICD-10-CM | POA: Insufficient documentation

## 2023-05-02 DIAGNOSIS — G2581 Restless legs syndrome: Secondary | ICD-10-CM | POA: Insufficient documentation

## 2023-05-02 DIAGNOSIS — Z5111 Encounter for antineoplastic chemotherapy: Secondary | ICD-10-CM | POA: Insufficient documentation

## 2023-05-02 DIAGNOSIS — Z170 Estrogen receptor positive status [ER+]: Secondary | ICD-10-CM | POA: Insufficient documentation

## 2023-05-02 DIAGNOSIS — Z7901 Long term (current) use of anticoagulants: Secondary | ICD-10-CM | POA: Insufficient documentation

## 2023-05-02 DIAGNOSIS — K1230 Oral mucositis (ulcerative), unspecified: Secondary | ICD-10-CM | POA: Insufficient documentation

## 2023-05-02 DIAGNOSIS — Z801 Family history of malignant neoplasm of trachea, bronchus and lung: Secondary | ICD-10-CM | POA: Insufficient documentation

## 2023-05-02 DIAGNOSIS — G629 Polyneuropathy, unspecified: Secondary | ICD-10-CM | POA: Insufficient documentation

## 2023-05-02 DIAGNOSIS — R110 Nausea: Secondary | ICD-10-CM | POA: Insufficient documentation

## 2023-05-02 NOTE — Telephone Encounter (Signed)
Nutrition Follow-up:  Patient with left breast cancer. She is currently receiving paclitaxel q7d.   Spoke with patient via telephone. She reports tolerating taxol fairly well other than fatigue. Appetite is better. Patient reports ongoing mucositis, however this is improving. She is eating cereal, tuna packets, ritz crackers, peanut butter sandwich without crust, milk, ice cream, banana pops, fruit. Says that foods taste off, like she has a sinus cold taste. Patient reports toothpaste continues to burn. Currently using Hello brand which is tolerable. She is drinking more water, gatorade, and tea. Patient is trying to drink protein shakes. These do not taste great.Taking stool softener. Bowel movements every 2-3 days.  Medications: reviewed   Labs: 6/25 labs reviewed - K 3.3  Anthropometrics: Last wt 187 lb 12.8 oz on 6/25 stable x one week  6/18 - 187 lb 9.6 oz    NUTRITION DIAGNOSIS: Food and nutrition related knowledge deficit improving    INTERVENTION:  Suggested baking soda salt water rinses several times daily and before meals - recipe provided  Encouraged protein source at every meal to support continued healing Continue drinking protein shake, recommend 1-2/day as tolerated    MONITORING, EVALUATION, GOAL: weight trends, intake   NEXT VISIT: Monday August 5 via telephone

## 2023-05-03 ENCOUNTER — Inpatient Hospital Stay: Payer: BC Managed Care – PPO

## 2023-05-03 ENCOUNTER — Inpatient Hospital Stay (HOSPITAL_BASED_OUTPATIENT_CLINIC_OR_DEPARTMENT_OTHER): Payer: BC Managed Care – PPO | Admitting: Hematology

## 2023-05-03 ENCOUNTER — Other Ambulatory Visit: Payer: Self-pay

## 2023-05-03 ENCOUNTER — Encounter: Payer: Self-pay | Admitting: Hematology

## 2023-05-03 VITALS — BP 114/71 | HR 81 | Temp 97.6°F | Resp 18

## 2023-05-03 DIAGNOSIS — Z79899 Other long term (current) drug therapy: Secondary | ICD-10-CM | POA: Diagnosis not present

## 2023-05-03 DIAGNOSIS — E876 Hypokalemia: Secondary | ICD-10-CM

## 2023-05-03 DIAGNOSIS — Z95828 Presence of other vascular implants and grafts: Secondary | ICD-10-CM | POA: Diagnosis not present

## 2023-05-03 DIAGNOSIS — R110 Nausea: Secondary | ICD-10-CM | POA: Diagnosis not present

## 2023-05-03 DIAGNOSIS — Z170 Estrogen receptor positive status [ER+]: Secondary | ICD-10-CM

## 2023-05-03 DIAGNOSIS — Z5111 Encounter for antineoplastic chemotherapy: Secondary | ICD-10-CM | POA: Diagnosis present

## 2023-05-03 DIAGNOSIS — Z803 Family history of malignant neoplasm of breast: Secondary | ICD-10-CM | POA: Diagnosis not present

## 2023-05-03 DIAGNOSIS — Z801 Family history of malignant neoplasm of trachea, bronchus and lung: Secondary | ICD-10-CM | POA: Diagnosis not present

## 2023-05-03 DIAGNOSIS — G2581 Restless legs syndrome: Secondary | ICD-10-CM | POA: Diagnosis not present

## 2023-05-03 DIAGNOSIS — Z86711 Personal history of pulmonary embolism: Secondary | ICD-10-CM | POA: Diagnosis not present

## 2023-05-03 DIAGNOSIS — K1230 Oral mucositis (ulcerative), unspecified: Secondary | ICD-10-CM | POA: Diagnosis not present

## 2023-05-03 DIAGNOSIS — G629 Polyneuropathy, unspecified: Secondary | ICD-10-CM | POA: Diagnosis not present

## 2023-05-03 DIAGNOSIS — Z7901 Long term (current) use of anticoagulants: Secondary | ICD-10-CM | POA: Diagnosis not present

## 2023-05-03 DIAGNOSIS — C50412 Malignant neoplasm of upper-outer quadrant of left female breast: Secondary | ICD-10-CM | POA: Diagnosis present

## 2023-05-03 LAB — CBC WITH DIFFERENTIAL/PLATELET
Abs Immature Granulocytes: 0.04 10*3/uL (ref 0.00–0.07)
Basophils Absolute: 0.1 10*3/uL (ref 0.0–0.1)
Basophils Relative: 2 %
Eosinophils Absolute: 0.1 10*3/uL (ref 0.0–0.5)
Eosinophils Relative: 1 %
HCT: 25.7 % — ABNORMAL LOW (ref 36.0–46.0)
Hemoglobin: 8.6 g/dL — ABNORMAL LOW (ref 12.0–15.0)
Immature Granulocytes: 1 %
Lymphocytes Relative: 18 %
Lymphs Abs: 1 10*3/uL (ref 0.7–4.0)
MCH: 32 pg (ref 26.0–34.0)
MCHC: 33.5 g/dL (ref 30.0–36.0)
MCV: 95.5 fL (ref 80.0–100.0)
Monocytes Absolute: 0.6 10*3/uL (ref 0.1–1.0)
Monocytes Relative: 11 %
Neutro Abs: 3.7 10*3/uL (ref 1.7–7.7)
Neutrophils Relative %: 67 %
Platelets: 225 10*3/uL (ref 150–400)
RBC: 2.69 MIL/uL — ABNORMAL LOW (ref 3.87–5.11)
RDW: 18.9 % — ABNORMAL HIGH (ref 11.5–15.5)
WBC: 5.4 10*3/uL (ref 4.0–10.5)
nRBC: 0 % (ref 0.0–0.2)

## 2023-05-03 LAB — COMPREHENSIVE METABOLIC PANEL
ALT: 45 U/L — ABNORMAL HIGH (ref 0–44)
AST: 43 U/L — ABNORMAL HIGH (ref 15–41)
Albumin: 3.3 g/dL — ABNORMAL LOW (ref 3.5–5.0)
Alkaline Phosphatase: 83 U/L (ref 38–126)
Anion gap: 7 (ref 5–15)
BUN: 13 mg/dL (ref 6–20)
CO2: 25 mmol/L (ref 22–32)
Calcium: 8.6 mg/dL — ABNORMAL LOW (ref 8.9–10.3)
Chloride: 105 mmol/L (ref 98–111)
Creatinine, Ser: 0.46 mg/dL (ref 0.44–1.00)
GFR, Estimated: >60 mL/min (ref >60)
Glucose, Bld: 115 mg/dL — ABNORMAL HIGH (ref 70–99)
Potassium: 3.1 mmol/L — ABNORMAL LOW (ref 3.5–5.1)
Sodium: 137 mmol/L (ref 135–145)
Total Bilirubin: 0.5 mg/dL (ref 0.3–1.2)
Total Protein: 6 g/dL — ABNORMAL LOW (ref 6.5–8.1)

## 2023-05-03 LAB — MAGNESIUM: Magnesium: 1.6 mg/dL — ABNORMAL LOW (ref 1.7–2.4)

## 2023-05-03 MED ORDER — SODIUM CHLORIDE 0.9 % IV SOLN
10.0000 mg | Freq: Once | INTRAVENOUS | Status: AC
  Administered 2023-05-03: 10 mg via INTRAVENOUS
  Filled 2023-05-03: qty 1

## 2023-05-03 MED ORDER — CETIRIZINE HCL 10 MG/ML IV SOLN
10.0000 mg | Freq: Once | INTRAVENOUS | Status: AC
  Administered 2023-05-03: 10 mg via INTRAVENOUS
  Filled 2023-05-03: qty 1

## 2023-05-03 MED ORDER — FAMOTIDINE IN NACL 20-0.9 MG/50ML-% IV SOLN
20.0000 mg | Freq: Once | INTRAVENOUS | Status: AC
  Administered 2023-05-03: 20 mg via INTRAVENOUS
  Filled 2023-05-03: qty 50

## 2023-05-03 MED ORDER — MAGNESIUM SULFATE 2 GM/50ML IV SOLN
2.0000 g | Freq: Once | INTRAVENOUS | Status: AC
  Administered 2023-05-03: 2 g via INTRAVENOUS
  Filled 2023-05-03: qty 50

## 2023-05-03 MED ORDER — SODIUM CHLORIDE 0.9 % IV SOLN
64.0000 mg/m2 | Freq: Once | INTRAVENOUS | Status: AC
  Administered 2023-05-03: 126 mg via INTRAVENOUS
  Filled 2023-05-03: qty 21

## 2023-05-03 MED ORDER — SODIUM CHLORIDE 0.9 % IV SOLN: Freq: Once | INTRAVENOUS | Status: AC

## 2023-05-03 MED ORDER — POTASSIUM CHLORIDE CRYS ER 20 MEQ PO TBCR
40.0000 meq | EXTENDED_RELEASE_TABLET | Freq: Once | ORAL | Status: AC
  Administered 2023-05-03: 40 meq via ORAL
  Filled 2023-05-03: qty 2

## 2023-05-03 MED ORDER — HEPARIN SOD (PORK) LOCK FLUSH 100 UNIT/ML IV SOLN
500.0000 [IU] | Freq: Once | INTRAVENOUS | Status: AC | PRN
  Administered 2023-05-03: 500 [IU]

## 2023-05-03 MED ORDER — POTASSIUM CHLORIDE CRYS ER 20 MEQ PO TBCR: 20.0000 meq | EXTENDED_RELEASE_TABLET | Freq: Every day | ORAL | 3 refills | Status: DC

## 2023-05-03 MED ORDER — SODIUM CHLORIDE 0.9% FLUSH
10.0000 mL | INTRAVENOUS | Status: DC | PRN
  Administered 2023-05-03: 10 mL

## 2023-05-03 NOTE — Progress Notes (Signed)
Patient has been examined by Dr. Ellin Saba. Vital signs and labs have been reviewed by MD - ANC, Creatinine, LFTs, hemoglobin, and platelets are within treatment parameters per M.D. - pt may proceed with treatment. Taxol dose-reduced by 20%. Primary RN and pharmacy notified.

## 2023-05-03 NOTE — Progress Notes (Signed)
[facility] [address] [place] [address]    Clinic Day:  05/03/2023  Referring physician: [name], PA-C  Patient Care Team: [name], PA-C as PCP - General (Physician Assistant) [name], MD as Medical Oncologist (Medical Oncology)   ASSESSMENT & PLAN:   Assessment: 1.  Stage I (T1c N1 M0, G2, ER/PR+, HER2-) left breast UOQ IDC: - Screening mammogram (12/01/2022): Possible distortion in the left breast.  Right breast has no findings suspicious for malignancy. - Left breast diagnostic mammogram/US (12/14/2022): Irregular hypoechoic mass at the 1 o'clock position of the left breast, 4 cm from nipple, measuring 1 x 0.7 x 0.9 cm.  No pathologic lymphadenopathy in the left axilla. - Left breast biopsy (12/16/2022): Invasive ductal carcinoma, grade 2, ER/PR 100%, Ki-67 1%, HER2 0 by IHC. - Left breast lumpectomy and SLNB on 01/10/2023. - Pathology: 1.4 x 1.1 x 1.1 cm moderately differentiated IDC, grade 2, margins negative.  1 sentinel lymph node with metastatic carcinoma (1.1 mm).  1 lymph node with micrometastasis (1 mm).  3 other sentinel lymph nodes negative for invasive carcinoma.  PT1 cpN1a. - Oncotype DX recurrence score 14. - CT CAP (02/08/2023): Left Staebler 1.4 cm sclerotic lesion indeterminate.  No evidence of metastatic disease.  Right nephrolithiasis. - PET scan (02/10/2023): Postop changes in the left breast and axilla.  No uptake in the left established lesion. - 2D echo (02/16/2023): LVEF 55 to 60%. - Cycle 1 AC on 02/22/2023   2.  Social/family history: - She lives at home with her husband.  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  No prior history of smoking. - Paternal grandmother had breast cancer.  Brother had kidney cancer.  Father had prostate cancer.   3.  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  Probable infarct in  the left lung base. - Heterozygosity for factor V Leiden mutation.  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  PT mutation negative.  Protein C, protein S, AT III normal. - She has been on Xarelto since then.   4.  Bone health: - DEXA scan (01/04/2023): T-score -0.3, normal.    Plan: 1.  Stage IB (T1c N1 M0, G2, ER/PR+, HER2-) left breast UOQ IDC: - She has come needed to weekly dose of paclitaxel. - She reported tenderness/sensitivity in the fingertips for the last 1 week.  Also reports that fingertips and toes feel cold for the last 1 week. - She reported dull headache and slight dizziness since Saturday with runny nose. - Reviewed labs today: AST and ALT mildly elevated at 43 and 45. - Because of early neuropathy, will cut back on the dose of paclitaxel by 20% for the next 2 weeks.  RTC 2 weeks for follow-up.  If symptoms resolve, consider giving full dose paclitaxel next time. - She will also receive IV magnesium today.   2.  Nausea prophylaxis: - Continue Phenergan as needed.  She did not require since the start of Taxol.   3.  Restless leg syndrome: - Continue Requip 2 mg half tablet in the morning and whole tablet at nighttime.   4.  Hypokalemia: - She is persistently low potassium.  Will start her on K-Dur 20 milliequivalents daily.   5.  Mucositis: - Slight sensitivity to the toothpaste is persisting.  No significant pain.    No orders of the defined types were placed in this encounter.  I,Katie Daubenspeck,acting as a Neurosurgeon for [name], MD.,have documented all relevant documentation on the behalf of [name], MD,as directed by  [name], MD while in the presence of [name], MD.   I, [name] MD, have reviewed the above documentation for accuracy and completeness, and I agree with the above.   [name], MD   7/2/20247:02 PM  CHIEF COMPLAINT:   Diagnosis: left breast cancer    Cancer Staging  Breast cancer of  upper-outer quadrant of left female breast [facility]) Staging form: Breast, AJCC 8th Edition - Clinical stage from 12/23/2022: Stage IB (cT1c, cN1(sn), cM0, G2, ER+, PR+, HER2-) - Unsigned    Prior Therapy: 1. Lumpectomy and SLNB, 01/10/23  2. Dose dense AC, 4 cycles, 02/22/23 - 04/05/23  Current Therapy:  weekly paclitaxel    HISTORY OF PRESENT ILLNESS:   Oncology History  Breast cancer of upper-outer quadrant of left female breast (HCC)  12/23/2022 Initial Diagnosis   Breast cancer of upper-outer quadrant of left female breast   02/22/2023 -  Chemotherapy   Patient is on Treatment Plan : BREAST ADJUVANT DOSE DENSE AC q14d / PACLitaxel q7d        INTERVAL HISTORY:   Judy Patton is a 52 y.o. female presenting to clinic today for follow up of left breast cancer. She was last seen by me on 04/19/23.  Today, she states that she is doing well overall. Her appetite level is at 80%. Her energy level is at 50%.  PAST MEDICAL HISTORY:   Past Medical History: Past Medical History:  Diagnosis Date   Anxiety    Chronic headaches    Depression    Factor V Leiden mutation (HCC)    Fatigue 02/18/2015   History of kidney stones    Hypothyroidism    MRSA (methicillin resistant Staphylococcus aureus)    Pain of tooth socket 03/14/2015   Pancreatitis    PONV (postoperative nausea and vomiting)    Port-A-Cath in place 02/14/2023   Pulmonary embolus (HCC)    Restless leg syndrome    Shingles rash 03/14/2015   Shoulder injury    Stress    family   Weight gain 02/18/2015    Surgical History: Past Surgical History:  Procedure Laterality Date   BREAST BIOPSY Left 12/16/2022   [place] LT BREAST BX W LOC DEV 1ST LESION IMG BX SPEC US GUIDE 12/16/2022 [facility]   BREAST BIOPSY Left 01/04/2023   [place] LT RADIO FREQUENCY TAG LOC US GUIDE 01/04/2023 Edwin Cap, MD [facility]   COLONOSCOPY  03/25/2004   ZOX:WRUE canal hemorrhoids, otherwise normal rectum,   DILITATION & CURRETTAGE/HYSTROSCOPY WITH  NOVASURE ABLATION N/A 11/08/2017   Procedure: DILATATION & CURETTAGE/HYSTEROSCOPY WITH NOVASURE ENDOMETRIAL ABLATION;  Surgeon: Tilda Burrow, MD;  Location: [facility];  Service: Gynecology;  Laterality: N/A;   ESOPHAGOGASTRODUODENOSCOPY  03/25/2004   AVW:UJWJXB esophagus, small hiatal hernia, otherwise normal stomach   kidney stones  05/2014,11/2014   LAPAROSCOPIC BILATERAL SALPINGECTOMY Bilateral 11/08/2017   Procedure: LAPAROSCOPIC BILATERAL SALPINGECTOMY;  Surgeon: Tilda Burrow, MD;  Location: [facility];  Service: Gynecology;  Laterality: Bilateral;   LITHOTRIPSY  2008   PARTIAL MASTECTOMY WITH AXILLARY SENTINEL LYMPH NODE BIOPSY Left 01/10/2023   Procedure: PARTIAL MASTECTOMY WITH AXILLARY SENTINEL LYMPH NODE BIOPSY AND RADIOFREQUENCY TAG PLACEMENT;  Surgeon: Lucretia Roers, MD;  Location: [facility];  Service: General;  Laterality: Left;   PORTACATH PLACEMENT Right 02/11/2023   Procedure: INSERTION PORT-A-CATH;  Surgeon: Lucretia Roers, MD;  Location: [facility];  Service: General;  Laterality: Right;    Social History: Social History   Socioeconomic History   Marital status: Married    Spouse name: Not on file   Number of children: Not on file   Years of education: Not on file   Highest education level: Not on file  Occupational History   Not on file  Tobacco Use   Smoking status: Never   Smokeless tobacco: Never  Vaping Use   Vaping Use: Never used  Substance and Sexual Activity   Alcohol use: Yes    Comment: occ   Drug use: No   Sexual activity: Not Currently    Birth control/protection: Surgical  Other Topics Concern   Not on file  Social History Narrative   Not on file   Social Determinants of Health   Financial Resource Strain: Not on file  Food Insecurity: No Food Insecurity (12/23/2022)   Hunger Vital Sign    Worried About Running Out of Food in the Last Year: Never true    Ran Out of Food in the Last Year: Never true  Transportation Needs: No Transportation  Needs (12/23/2022)   PRAPARE - Transportation    Lack of Transportation (Medical): No    Lack of Transportation (Non-Medical): No  Physical Activity: Not on file  Stress: Not on file  Social Connections: Not on file  Intimate Partner Violence: Not At Risk (12/23/2022)   Humiliation, Afraid, Rape, and Kick questionnaire    Fear of Current or Ex-Partner: No    Emotionally Abused: No    Physically Abused: No    Sexually Abused: No    Family History: Family History  Problem Relation Age of Onset   Stroke Mother    CAD Mother    Prostate cancer Father 47   Kidney cancer Brother        dx 50s   Factor V Leiden deficiency Brother    Fibromyalgia Brother    Breast cancer Paternal Grandmother        dx >50   Lung cancer Paternal Grandfather     Current Medications:  Current Outpatient Medications:    acetaminophen (TYLENOL) 500 MG tablet, Take 1,000 mg by mouth every 6 (six) hours as needed for moderate pain or headache., Disp: , Rfl:    aluminum-magnesium hydroxide 200-200 MG/5ML suspension, Take 15 mLs by mouth 4 (four) times daily as needed for indigestion. Mix 1:1 with lidocaine and swish and swallow, Disp: 355 mL, Rfl: 0   ibuprofen (ADVIL,MOTRIN) 200 MG tablet, Take 400 mg by mouth every 6 (six) hours as needed for headache or moderate pain., Disp: , Rfl:    levothyroxine (SYNTHROID, LEVOTHROID) 88 MCG tablet, TAKE 88 MCG BY MOUTH IN THE MORNING, Disp: 90 tablet, Rfl: 3   lidocaine (XYLOCAINE) 2 % solution, Use as directed 15 mLs in the mouth or throat 4 (four) times daily as needed for mouth pain. Mix 1:1 with maalox and swish and swallow, Disp: 100 mL, Rfl: 0   lidocaine-prilocaine (EMLA) cream, Apply a quarter sized amount to port a cath site and cover with plastic wrap 1 hour prior to infusion appointments, Disp: 30 g, Rfl: 3   loratadine (CLARITIN) 10 MG tablet, Take 10 mg by mouth daily., Disp: , Rfl:    ondansetron (ZOFRAN) 4 MG tablet, Take 1 tablet (4 mg total) by mouth  every 8 (eight) hours as needed., Disp: 30 tablet, Rfl: 1   oxyCODONE (ROXICODONE) 5 MG immediate release tablet, Take 1 tablet (5 mg  total) by mouth every 4 (four) hours as needed for severe pain or breakthrough pain., Disp: 5 tablet, Rfl: 0   PACLitaxel (TAXOL IV), Inject into the vein once a week., Disp: , Rfl:    Pegfilgrastim-cbqv (UDENYCA Kappa), Inject into the skin every 14 (fourteen) days., Disp: , Rfl:    Polyethylene Glycol 400 (VISINE DRY EYE RELIEF OP), Place 1 drop into both eyes daily as needed (dry eye)., Disp: , Rfl:    potassium chloride SA (KLOR-CON M) 20 MEQ tablet, Take 1 tablet (20 mEq total) by mouth daily., Disp: 30 tablet, Rfl: 3   promethazine (PHENERGAN) 25 MG tablet, Take 1 tablet (25 mg total) by mouth every 6 (six) hours as needed for nausea or vomiting., Disp: 30 tablet, Rfl: 3   rOPINIRole (REQUIP) 2 MG tablet, TAKE ONE TABLET BY MOUTH AT BEDTIME, Disp: 30 tablet, Rfl: 9   sucralfate (CARAFATE) 1 GM/10ML suspension, Take 10 mLs (1 g total) by mouth 4 (four) times daily -  with meals and at bedtime., Disp: 420 mL, Rfl: 0   TRINTELLIX 20 MG TABS tablet, Take 20 mg by mouth daily., Disp: , Rfl:    trolamine salicylate (ASPERCREME) 10 % cream, Apply 1 application topically as needed for muscle pain., Disp: , Rfl:    XARELTO 20 MG TABS tablet, , Disp: , Rfl: 2 No current facility-administered medications for this visit.  Facility-Administered Medications Ordered in Other Visits:    sodium chloride flush (NS) 0.9 % injection 10 mL, 10 mL, Intracatheter, PRN, [name], MD, 10 mL at 05/03/23 1328   Allergies: No Known Allergies  REVIEW OF SYSTEMS:   Review of Systems  Constitutional:  Positive for fatigue. Negative for chills and fever.  HENT:   Positive for trouble swallowing. Negative for lump/mass, mouth sores, nosebleeds and sore throat.   Eyes:  Negative for eye problems.  Respiratory:  Negative for cough and shortness of breath.   Cardiovascular:   Negative for chest pain, leg swelling and palpitations.  Gastrointestinal:  Negative for abdominal pain, constipation, diarrhea, nausea and vomiting.  Genitourinary:  Negative for bladder incontinence, difficulty urinating, dysuria, frequency, hematuria and nocturia.   Musculoskeletal:  Negative for arthralgias, back pain, flank pain, myalgias and neck pain.  Skin:  Negative for itching and rash.  Neurological:  Positive for dizziness and numbness. Negative for headaches.  Hematological:  Does not bruise/bleed easily.  Psychiatric/Behavioral:  Negative for depression, sleep disturbance and suicidal ideas. The patient is not nervous/anxious.   All other systems reviewed and are negative.    VITALS:   Blood pressure 128/71, pulse 89, temperature 98 F (36.7 C), temperature source Oral, resp. rate 18, weight 190 lb (86.2 kg), SpO2 100 %.  Wt Readings from Last 3 Encounters:  05/03/23 190 lb (86.2 kg)  04/26/23 187 lb 12.8 oz (85.2 kg)  04/19/23 187 lb 9.6 oz (85.1 kg)    Body mass index is 31.62 kg/m.  Performance status (ECOG): 1 - Symptomatic but completely ambulatory  PHYSICAL EXAM:   Physical Exam Vitals and nursing note reviewed. Exam conducted with a chaperone present.  Constitutional:      Appearance: Normal appearance.  Cardiovascular:     Rate and Rhythm: Normal rate and regular rhythm.     Pulses: Normal pulses.     Heart sounds: Normal heart sounds.  Pulmonary:     Effort: Pulmonary effort is normal.     Breath sounds: Normal breath sounds.  Abdominal:     Palpations: Abdomen  is soft. There is no hepatomegaly, splenomegaly or mass.     Tenderness: There is no abdominal tenderness.  Musculoskeletal:     Right lower leg: No edema.     Left lower leg: No edema.  Lymphadenopathy:     Cervical: No cervical adenopathy.     Right cervical: No superficial, deep or posterior cervical adenopathy.    Left cervical: No superficial, deep or posterior cervical adenopathy.      Upper Body:     Right upper body: No supraclavicular or axillary adenopathy.     Left upper body: No supraclavicular or axillary adenopathy.  Neurological:     General: No focal deficit present.     Mental Status: She is alert and oriented to person, place, and time.  Psychiatric:        Mood and Affect: Mood normal.        Behavior: Behavior normal.     LABS:      Latest Ref Rng & Units 05/03/2023    9:14 AM 04/26/2023    9:03 AM 04/19/2023    9:27 AM  CBC  WBC 4.0 - 10.5 K/uL 5.4  6.6  7.9   Hemoglobin 12.0 - 15.0 g/dL 8.6  8.7  9.2   Hematocrit 36.0 - 46.0 % 25.7  26.0  27.7   Platelets 150 - 400 K/uL 225  309  167       Latest Ref Rng & Units 05/03/2023    9:14 AM 04/26/2023    9:03 AM 04/19/2023    9:27 AM  CMP  Glucose 70 - 99 mg/dL 161  096  045   BUN 6 - 20 mg/dL 13  12  10    Creatinine 0.44 - 1.00 mg/dL 4.09  8.11  9.14   Sodium 135 - 145 mmol/L 137  138  137   Potassium 3.5 - 5.1 mmol/L 3.1  3.3  3.2   Chloride 98 - 111 mmol/L 105  107  104   CO2 22 - 32 mmol/L 25  24  25    Calcium 8.9 - 10.3 mg/dL 8.6  8.6  8.4   Total Protein 6.5 - 8.1 g/dL 6.0  6.5  6.6   Total Bilirubin 0.3 - 1.2 mg/dL 0.5  0.5  0.6   Alkaline Phos 38 - 126 U/L 83  80  99   AST 15 - 41 U/L 43  28  25   ALT 0 - 44 U/L 45  28  22      No results found for: "CEA1", "CEA" / No results found for: "CEA1", "CEA" No results found for: "PSA1" No results found for: "NWG956" No results found for: "CAN125"  No results found for: "TOTALPROTELP", "ALBUMINELP", "A1GS", "A2GS", "BETS", "BETA2SER", "GAMS", "MSPIKE", "SPEI" Lab Results  Component Value Date   FERRITIN 110 11/13/2013   FERRITIN 81 08/07/2013   No results found for: "LDH"   STUDIES:   No results found.

## 2023-05-03 NOTE — Patient Instructions (Signed)
[facility]  Discharge Instructions: Thank you for choosing [facility] to provide your oncology and hematology care.  If you have a lab appointment with the Cancer Center - please note that after April 8th, 2024, all labs will be drawn in the cancer center.  You do not have to check in or register with the main entrance as you have in the past but will complete your check-in in the cancer center.  Wear comfortable clothing and clothing appropriate for easy access to any Portacath or PICC line.   We strive to give you quality time with your provider. You may need to reschedule your appointment if you arrive late (15 or more minutes).  Arriving late affects you and other patients whose appointments are after yours.  Also, if you miss three or more appointments without notifying the office, you may be dismissed from the clinic at the provider's discretion.      For prescription refill requests, have your pharmacy contact our office and allow 72 hours for refills to be completed.    Today you received the following chemotherapy and/or immunotherapy agents Taxol. Paclitaxel Injection What is this medication? PACLITAXEL (PAK li TAX el) treats some types of cancer. It works by slowing down the growth of cancer cells. This medicine may be used for other purposes; ask your health care provider or pharmacist if you have questions. COMMON BRAND NAME(S): Onxol, Taxol What should I tell my care team before I take this medication? They need to know if you have any of these conditions: Heart disease Liver disease Low white blood cell levels An unusual or allergic reaction to paclitaxel, other medications, foods, dyes, or preservatives If you or your partner are pregnant or trying to get pregnant Breast-feeding How should I use this medication? This medication is injected into a vein. It is given by your care team in a hospital or clinic setting. Talk to your care team  about the use of this medication in children. While it may be given to children for selected conditions, precautions do apply. Overdosage: If you think you have taken too much of this medicine contact a poison control center or emergency room at once. NOTE: This medicine is only for you. Do not share this medicine with others. What if I miss a dose? Keep appointments for follow-up doses. It is important not to miss your dose. Call your care team if you are unable to keep an appointment. What may interact with this medication? Do not take this medication with any of the following: Live virus vaccines Other medications may affect the way this medication works. Talk with your care team about all of the medications you take. They may suggest changes to your treatment plan to lower the risk of side effects and to make sure your medications work as intended. This list may not describe all possible interactions. Give your health care provider a list of all the medicines, herbs, non-prescription drugs, or dietary supplements you use. Also tell them if you smoke, drink alcohol, or use illegal drugs. Some items may interact with your medicine. What should I watch for while using this medication? Your condition will be monitored carefully while you are receiving this medication. You may need blood work while taking this medication. This medication may make you feel generally unwell. This is not uncommon as chemotherapy can affect healthy cells as well as cancer cells. Report any side effects. Continue your course of treatment even though you   feel ill unless your care team tells you to stop. This medication can cause serious allergic reactions. To reduce the risk, your care team may give you other medications to take before receiving this one. Be sure to follow the directions from your care team. This medication may increase your risk of getting an infection. Call your care team for advice if you get a fever,  chills, sore throat, or other symptoms of a cold or flu. Do not treat yourself. Try to avoid being around people who are sick. This medication may increase your risk to bruise or bleed. Call your care team if you notice any unusual bleeding. Be careful brushing or flossing your teeth or using a toothpick because you may get an infection or bleed more easily. If you have any dental work done, tell your dentist you are receiving this medication. Talk to your care team if you may be pregnant. Serious birth defects can occur if you take this medication during pregnancy. Talk to your care team before breastfeeding. Changes to your treatment plan may be needed. What side effects may I notice from receiving this medication? Side effects that you should report to your care team as soon as possible: Allergic reactions--skin rash, itching, hives, swelling of the face, lips, tongue, or throat Heart rhythm changes--fast or irregular heartbeat, dizziness, feeling faint or lightheaded, chest pain, trouble breathing Increase in blood pressure Infection--fever, chills, cough, sore throat, wounds that don't heal, pain or trouble when passing urine, general feeling of discomfort or being unwell Low blood pressure--dizziness, feeling faint or lightheaded, blurry vision Low red blood cell level--unusual weakness or fatigue, dizziness, headache, trouble breathing Painful swelling, warmth, or redness of the skin, blisters or sores at the infusion site Pain, tingling, or numbness in the hands or feet Slow heartbeat--dizziness, feeling faint or lightheaded, confusion, trouble breathing, unusual weakness or fatigue Unusual bruising or bleeding Side effects that usually do not require medical attention (report to your care team if they continue or are bothersome): Diarrhea Hair loss Joint pain Loss of appetite Muscle pain Nausea Vomiting This list may not describe all possible side effects. Call your doctor for  medical advice about side effects. You may report side effects to FDA at 1-800-FDA-1088. Where should I keep my medication? This medication is given in a hospital or clinic. It will not be stored at home. NOTE: This sheet is a summary. It may not cover all possible information. If you have questions about this medicine, talk to your doctor, pharmacist, or health care provider.  2024 Elsevier/Gold Standard (2022-03-09 00:00:00)       To help prevent nausea and vomiting after your treatment, we encourage you to take your nausea medication as directed.  BELOW ARE SYMPTOMS THAT SHOULD BE REPORTED IMMEDIATELY: *FEVER GREATER THAN 100.4 F (38 C) OR HIGHER *CHILLS OR SWEATING *NAUSEA AND VOMITING THAT IS NOT CONTROLLED WITH YOUR NAUSEA MEDICATION *UNUSUAL SHORTNESS OF BREATH *UNUSUAL BRUISING OR BLEEDING *URINARY PROBLEMS (pain or burning when urinating, or frequent urination) *BOWEL PROBLEMS (unusual diarrhea, constipation, pain near the anus) TENDERNESS IN MOUTH AND THROAT WITH OR WITHOUT PRESENCE OF ULCERS (sore throat, sores in mouth, or a toothache) UNUSUAL RASH, SWELLING OR PAIN  UNUSUAL VAGINAL DISCHARGE OR ITCHING   Items with * indicate a potential emergency and should be followed up as soon as possible or go to the Emergency Department if any problems should occur.  Please show the CHEMOTHERAPY ALERT CARD or IMMUNOTHERAPY ALERT CARD at check-in to the Emergency   Department and triage nurse.  Should you have questions after your visit or need to cancel or reschedule your appointment, please contact [facility] [phone]  and follow the prompts.  Office hours are 8:00 a.m. to 4:30 p.m. Monday - Friday. Please note that voicemails left after 4:00 p.m. may not be returned until the following business day.  We are closed weekends and major holidays. You have access to a nurse at all times for urgent questions. Please call the main number to the clinic [phone] and  follow the prompts.  For any non-urgent questions, you may also contact your provider using MyChart. We now offer e-Visits for anyone 18 and older to request care online for non-urgent symptoms. For details visit [email or web address].   Also download the MyChart app! Go to the app store, search "MyChart", open the app, select [place], and log in with your MyChart username and password.   

## 2023-05-03 NOTE — Patient Instructions (Signed)
[facility] Discharge Instructions   You were seen and examined today by Dr. Ellin Saba.  He reviewed the results of your lab work which are mostly normal/stable. Your magnesium and potassium are slightly low. We will start you on potassium supplements at home.   We will proceed with your treatment today.   Return as scheduled.    Thank you for choosing [facility] to provide your oncology and hematology care.  To afford each patient quality time with our provider, please arrive at least 15 minutes before your scheduled appointment time.   If you have a lab appointment with the Cancer Center please come in thru the Main Entrance and check in at the main information desk.  You need to re-schedule your appointment should you arrive 10 or more minutes late.  We strive to give you quality time with our providers, and arriving late affects you and other patients whose appointments are after yours.  Also, if you no show three or more times for appointments you may be dismissed from the clinic at the providers discretion.     Again, thank you for choosing [facility].  Our hope is that these requests will decrease the amount of time that you wait before being seen by our physicians.       _____________________________________________________________  Should you have questions after your visit to [facility], please contact our office at [phone] and follow the prompts.  Our office hours are 8:00 a.m. and 4:30 p.m. Monday - Friday.  Please note that voicemails left after 4:00 p.m. may not be returned until the following business day.  We are closed weekends and major holidays.  You do have access to a nurse 24-7, just call the main number to the clinic [phone] and do not press any options, hold on the line and a nurse will answer the phone.    For prescription refill requests, have your  pharmacy contact our office and allow 72 hours.    Due to Covid, you will need to wear a mask upon entering the hospital. If you do not have a mask, a mask will be given to you at the Main Entrance upon arrival. For doctor visits, patients may have 1 support person age 49 or older with them. For treatment visits, patients can not have anyone with them due to social distancing guidelines and our immunocompromised population.

## 2023-05-03 NOTE — Progress Notes (Signed)
Patient presents today for chemotherapy infusion. Patient is in satisfactory condition with no new complaints voiced.  Vital signs are stable.  Labs reviewed by Dr. Ellin Saba during the office visit and all labs are within treatment parameters.  Magnesium today is 1.6.  We will give magnesium sulfate 2 grams IV x one dose per standing orders by Dr. Ellin Saba.  Potassium today is 3.1.  We will give Klor Con 40 mEq PO x one dose today per standing orders by Dr. Ellin Saba.  We will proceed with treatment per MD orders.   Patient tolerated treatment well with no complaints voiced.  Patient left ambulatory with husband in stable condition.  Vital signs stable at discharge.  Follow up as scheduled.

## 2023-05-10 ENCOUNTER — Inpatient Hospital Stay: Payer: BC Managed Care – PPO

## 2023-05-10 ENCOUNTER — Other Ambulatory Visit: Payer: Self-pay

## 2023-05-10 ENCOUNTER — Inpatient Hospital Stay: Payer: BC Managed Care – PPO | Admitting: Hematology

## 2023-05-10 VITALS — BP 132/72 | HR 79 | Temp 97.1°F | Resp 18

## 2023-05-10 DIAGNOSIS — Z5111 Encounter for antineoplastic chemotherapy: Secondary | ICD-10-CM | POA: Diagnosis not present

## 2023-05-10 DIAGNOSIS — Z95828 Presence of other vascular implants and grafts: Secondary | ICD-10-CM

## 2023-05-10 DIAGNOSIS — C50412 Malignant neoplasm of upper-outer quadrant of left female breast: Secondary | ICD-10-CM

## 2023-05-10 LAB — CBC WITH DIFFERENTIAL/PLATELET
Abs Immature Granulocytes: 0.06 10*3/uL (ref 0.00–0.07)
Basophils Absolute: 0.1 10*3/uL (ref 0.0–0.1)
Basophils Relative: 1 %
Eosinophils Absolute: 0.2 10*3/uL (ref 0.0–0.5)
Eosinophils Relative: 3 %
HCT: 27.4 % — ABNORMAL LOW (ref 36.0–46.0)
Hemoglobin: 8.9 g/dL — ABNORMAL LOW (ref 12.0–15.0)
Immature Granulocytes: 1 %
Lymphocytes Relative: 16 %
Lymphs Abs: 1 10*3/uL (ref 0.7–4.0)
MCH: 32.5 pg (ref 26.0–34.0)
MCHC: 32.5 g/dL (ref 30.0–36.0)
MCV: 100 fL (ref 80.0–100.0)
Monocytes Absolute: 0.7 10*3/uL (ref 0.1–1.0)
Monocytes Relative: 12 %
Neutro Abs: 4.1 10*3/uL (ref 1.7–7.7)
Neutrophils Relative %: 67 %
Platelets: 241 10*3/uL (ref 150–400)
RBC: 2.74 MIL/uL — ABNORMAL LOW (ref 3.87–5.11)
RDW: 20.2 % — ABNORMAL HIGH (ref 11.5–15.5)
WBC: 6.1 10*3/uL (ref 4.0–10.5)
nRBC: 0 % (ref 0.0–0.2)

## 2023-05-10 LAB — COMPREHENSIVE METABOLIC PANEL
ALT: 47 U/L — ABNORMAL HIGH (ref 0–44)
AST: 41 U/L (ref 15–41)
Albumin: 3.3 g/dL — ABNORMAL LOW (ref 3.5–5.0)
Alkaline Phosphatase: 85 U/L (ref 38–126)
Anion gap: 8 (ref 5–15)
BUN: 11 mg/dL (ref 6–20)
CO2: 23 mmol/L (ref 22–32)
Calcium: 8.5 mg/dL — ABNORMAL LOW (ref 8.9–10.3)
Chloride: 105 mmol/L (ref 98–111)
Creatinine, Ser: 0.58 mg/dL (ref 0.44–1.00)
GFR, Estimated: >60 mL/min (ref >60)
Glucose, Bld: 96 mg/dL (ref 70–99)
Potassium: 3.5 mmol/L (ref 3.5–5.1)
Sodium: 136 mmol/L (ref 135–145)
Total Bilirubin: 0.5 mg/dL (ref 0.3–1.2)
Total Protein: 6.3 g/dL — ABNORMAL LOW (ref 6.5–8.1)

## 2023-05-10 LAB — MAGNESIUM: Magnesium: 1.6 mg/dL — ABNORMAL LOW (ref 1.7–2.4)

## 2023-05-10 MED ORDER — CETIRIZINE HCL 10 MG/ML IV SOLN
10.0000 mg | Freq: Once | INTRAVENOUS | Status: AC
  Administered 2023-05-10: 10 mg via INTRAVENOUS
  Filled 2023-05-10: qty 1

## 2023-05-10 MED ORDER — FAMOTIDINE IN NACL 20-0.9 MG/50ML-% IV SOLN
20.0000 mg | Freq: Once | INTRAVENOUS | Status: AC
  Administered 2023-05-10: 20 mg via INTRAVENOUS
  Filled 2023-05-10: qty 50

## 2023-05-10 MED ORDER — SODIUM CHLORIDE 0.9 % IV SOLN: Freq: Once | INTRAVENOUS | Status: AC

## 2023-05-10 MED ORDER — MAGNESIUM SULFATE 2 GM/50ML IV SOLN
2.0000 g | Freq: Once | INTRAVENOUS | Status: AC
  Administered 2023-05-10: 2 g via INTRAVENOUS
  Filled 2023-05-10: qty 50

## 2023-05-10 MED ORDER — SODIUM CHLORIDE 0.9 % IV SOLN
64.0000 mg/m2 | Freq: Once | INTRAVENOUS | Status: AC
  Administered 2023-05-10: 126 mg via INTRAVENOUS
  Filled 2023-05-10: qty 21

## 2023-05-10 MED ORDER — HEPARIN SOD (PORK) LOCK FLUSH 100 UNIT/ML IV SOLN
500.0000 [IU] | Freq: Once | INTRAVENOUS | Status: AC | PRN
  Administered 2023-05-10: 500 [IU]

## 2023-05-10 MED ORDER — SODIUM CHLORIDE 0.9 % IV SOLN
10.0000 mg | Freq: Once | INTRAVENOUS | Status: AC
  Administered 2023-05-10: 10 mg via INTRAVENOUS
  Filled 2023-05-10: qty 10

## 2023-05-10 MED ORDER — SODIUM CHLORIDE 0.9% FLUSH
10.0000 mL | INTRAVENOUS | Status: DC | PRN
  Administered 2023-05-10: 10 mL

## 2023-05-10 MED ORDER — SODIUM CHLORIDE 0.9% FLUSH
10.0000 mL | INTRAVENOUS | Status: DC | PRN
  Administered 2023-05-10: 10 mL via INTRAVENOUS

## 2023-05-10 NOTE — Progress Notes (Signed)
Patients port flushed without difficulty.  Good blood return noted with no bruising or swelling noted at site.  Patient remains accessed for chemotherapy treatment.  

## 2023-05-16 NOTE — Progress Notes (Signed)
[facility] [address], [place] [address]    Clinic Day:  05/17/2023  Referring physician: [name], PA-C  Patient Care Team: [name], PA-C as PCP - General (Physician Assistant) [name], MD as Medical Oncologist (Medical Oncology)   ASSESSMENT & PLAN:   Assessment: 1.  Stage I (T1c N1 M0, G2, ER/PR+, HER2-) left breast UOQ IDC: - Screening mammogram (12/01/2022): Possible distortion in the left breast.  Right breast has no findings suspicious for malignancy. - Left breast diagnostic mammogram/US (12/14/2022): Irregular hypoechoic mass at the 1 o'clock position of the left breast, 4 cm from nipple, measuring 1 x 0.7 x 0.9 cm.  No pathologic lymphadenopathy in the left axilla. - Left breast biopsy (12/16/2022): Invasive ductal carcinoma, grade 2, ER/PR 100%, Ki-67 1%, HER2 0 by IHC. - Left breast lumpectomy and SLNB on 01/10/2023. - Pathology: 1.4 x 1.1 x 1.1 cm moderately differentiated IDC, grade 2, margins negative.  1 sentinel lymph node with metastatic carcinoma (1.1 mm).  1 lymph node with micrometastasis (1 mm).  3 other sentinel lymph nodes negative for invasive carcinoma.  PT1 cpN1a. - Oncotype DX recurrence score 14. - CT CAP (02/08/2023): Left Staebler 1.4 cm sclerotic lesion indeterminate.  No evidence of metastatic disease.  Right nephrolithiasis. - PET scan (02/10/2023): Postop changes in the left breast and axilla.  No uptake in the left established lesion. - 2D echo (02/16/2023): LVEF 55 to 60%. - Cycle 1 AC on 02/22/2023   2.  Social/family history: - She lives at home with her husband.  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  No prior history of smoking. - Paternal grandmother had breast cancer.  Brother had kidney cancer.  Father had prostate cancer.   3.  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  Probable infarct in  the left lung base. - Heterozygosity for factor V Leiden mutation.  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  PT mutation negative.  Protein C, protein S, AT III normal. - She has been on Xarelto since then.   4.  Bone health: - DEXA scan (01/04/2023): T-score -0.3, normal.    Plan: 1.  Stage IB (T1c N1 M0, G2, ER/PR+, HER2-) left breast UOQ IDC: - She is tolerating weekly paclitaxel reasonably well. - She reported worsening tenderness/sensitivity in the fingertips for the last 1 week.  She has difficulty opening bottle caps and occasionally drops things from her hands. - She also reported dull pain in the back of the neck for the last couple of weeks and takes ibuprofen 3 tablets once daily. - Reviewed labs today: Normal LFTs and creatinine.  CBC grossly normal. - Will proceed with next cycle today with 20% dose reduction.  RTC 2 weeks for follow-up.   2.  Peripheral neuropathy: - She has soreness in the fingertips and has difficulty opening bottle caps and occasionally drops objects. - Will start her on gabapentin 300 mg twice daily.  Paclitaxel dose reduced by 20%.   3.  Restless leg syndrome: - Continue Requip 2 mg half tablet in the morning and whole tablet at nighttime.   4.  Hypokalemia: - Continue potassium 20 mill colons daily.  Potassium is normal today.       No orders of the defined types were placed in this encounter.      Alben Deeds Teague,acting as a Neurosurgeon for [name], MD.,have documented all relevant documentation on the  behalf of [name], MD,as directed by  [name], MD while in the presence of [name], MD.  I, [name] MD, have reviewed the above documentation for accuracy and completeness, and I agree with the above.    [name], MD   7/16/20245:38 PM  CHIEF COMPLAINT:   Diagnosis: left breast cancer    Cancer Staging  Breast cancer of upper-outer quadrant of left female breast [facility]) Staging form:  Breast, AJCC 8th Edition - Clinical stage from 12/23/2022: Stage IB (cT1c, cN1(sn), cM0, G2, ER+, PR+, HER2-) - Unsigned    Prior Therapy: 1. Lumpectomy and SLNB, 01/10/23  2. Dose dense AC, 4 cycles, 02/22/23 - 04/05/23  Current Therapy:  weekly paclitaxel    HISTORY OF PRESENT ILLNESS:   Oncology History  Breast cancer of upper-outer quadrant of left female breast (HCC)  12/23/2022 Initial Diagnosis   Breast cancer of upper-outer quadrant of left female breast   02/22/2023 -  Chemotherapy   Patient is on Treatment Plan : BREAST ADJUVANT DOSE DENSE AC q14d / PACLitaxel q7d        INTERVAL HISTORY:   Judy Patton is a 52 y.o. female presenting to clinic today for follow up of left breast cancer. She was last seen by me on 05/03/23.  Today, she states that she is doing well overall. Her appetite level is at 100%. Her energy level is at 75%.  PAST MEDICAL HISTORY:   Past Medical History: Past Medical History:  Diagnosis Date   Anxiety    Chronic headaches    Depression    Factor V Leiden mutation (HCC)    Fatigue 02/18/2015   History of kidney stones    Hypothyroidism    MRSA (methicillin resistant Staphylococcus aureus)    Pain of tooth socket 03/14/2015   Pancreatitis    PONV (postoperative nausea and vomiting)    Port-A-Cath in place 02/14/2023   Pulmonary embolus (HCC)    Restless leg syndrome    Shingles rash 03/14/2015   Shoulder injury    Stress    family   Weight gain 02/18/2015    Surgical History: Past Surgical History:  Procedure Laterality Date   BREAST BIOPSY Left 12/16/2022   [place] LT BREAST BX W LOC DEV 1ST LESION IMG BX SPEC US GUIDE 12/16/2022 [facility]   BREAST BIOPSY Left 01/04/2023   [place] LT RADIO FREQUENCY TAG LOC US GUIDE 01/04/2023 Edwin Cap, MD [facility]   COLONOSCOPY  03/25/2004   WUJ:WJXB canal hemorrhoids, otherwise normal rectum,   DILITATION & CURRETTAGE/HYSTROSCOPY WITH NOVASURE ABLATION N/A 11/08/2017   Procedure: DILATATION &  CURETTAGE/HYSTEROSCOPY WITH NOVASURE ENDOMETRIAL ABLATION;  Surgeon: Tilda Burrow, MD;  Location: [facility];  Service: Gynecology;  Laterality: N/A;   ESOPHAGOGASTRODUODENOSCOPY  03/25/2004   JYN:WGNFAO esophagus, small hiatal hernia, otherwise normal stomach   kidney stones  05/2014,11/2014   LAPAROSCOPIC BILATERAL SALPINGECTOMY Bilateral 11/08/2017   Procedure: LAPAROSCOPIC BILATERAL SALPINGECTOMY;  Surgeon: Tilda Burrow, MD;  Location: [facility];  Service: Gynecology;  Laterality: Bilateral;   LITHOTRIPSY  2008   PARTIAL MASTECTOMY WITH AXILLARY SENTINEL LYMPH NODE BIOPSY Left 01/10/2023   Procedure: PARTIAL MASTECTOMY WITH AXILLARY SENTINEL LYMPH NODE BIOPSY AND RADIOFREQUENCY TAG PLACEMENT;  Surgeon: Lucretia Roers, MD;  Location: [facility];  Service: General;  Laterality: Left;   PORTACATH PLACEMENT Right 02/11/2023   Procedure: INSERTION PORT-A-CATH;  Surgeon: Lucretia Roers, MD;  Location: [facility];  Service: General;  Laterality: Right;    Social History: Social History  Socioeconomic History   Marital status: Married    Spouse name: Not on file   Number of children: Not on file   Years of education: Not on file   Highest education level: Not on file  Occupational History   Not on file  Tobacco Use   Smoking status: Never   Smokeless tobacco: Never  Vaping Use   Vaping status: Never Used  Substance and Sexual Activity   Alcohol use: Yes    Comment: occ   Drug use: No   Sexual activity: Not Currently    Birth control/protection: Surgical  Other Topics Concern   Not on file  Social History Narrative   Not on file   Social Determinants of Health   Financial Resource Strain: Not on file  Food Insecurity: No Food Insecurity (12/23/2022)   Hunger Vital Sign    Worried About Running Out of Food in the Last Year: Never true    Ran Out of Food in the Last Year: Never true  Transportation Needs: No Transportation Needs (12/23/2022)   PRAPARE - Transportation    Lack of  Transportation (Medical): No    Lack of Transportation (Non-Medical): No  Physical Activity: Not on file  Stress: Not on file  Social Connections: Unknown (03/12/2022)   [facility]   Social Network    Social Network: Not on file  Intimate Partner Violence: Not At Risk (12/23/2022)   Humiliation, Afraid, Rape, and Kick questionnaire    Fear of Current or Ex-Partner: No    Emotionally Abused: No    Physically Abused: No    Sexually Abused: No    Family History: Family History  Problem Relation Age of Onset   Stroke Mother    CAD Mother    Prostate cancer Father 50   Kidney cancer Brother        dx 14s   Factor V Leiden deficiency Brother    Fibromyalgia Brother    Breast cancer Paternal Grandmother        dx >50   Lung cancer Paternal Grandfather     Current Medications:  Current Outpatient Medications:    acetaminophen (TYLENOL) 500 MG tablet, Take 1,000 mg by mouth every 6 (six) hours as needed for moderate pain or headache., Disp: , Rfl:    aluminum-magnesium hydroxide 200-200 MG/5ML suspension, Take 15 mLs by mouth 4 (four) times daily as needed for indigestion. Mix 1:1 with lidocaine and swish and swallow, Disp: 355 mL, Rfl: 0   gabapentin (NEURONTIN) 300 MG capsule, Take 1 capsule (300 mg total) by mouth 2 (two) times daily., Disp: 60 capsule, Rfl: 2   ibuprofen (ADVIL,MOTRIN) 200 MG tablet, Take 400 mg by mouth every 6 (six) hours as needed for headache or moderate pain., Disp: , Rfl:    levothyroxine (SYNTHROID, LEVOTHROID) 88 MCG tablet, TAKE 88 MCG BY MOUTH IN THE MORNING, Disp: 90 tablet, Rfl: 3   lidocaine (XYLOCAINE) 2 % solution, Use as directed 15 mLs in the mouth or throat 4 (four) times daily as needed for mouth pain. Mix 1:1 with maalox and swish and swallow, Disp: 100 mL, Rfl: 0   lidocaine-prilocaine (EMLA) cream, Apply a quarter sized amount to port a cath site and cover with plastic wrap 1 hour prior to infusion appointments, Disp: 30 g, Rfl:  3   loratadine (CLARITIN) 10 MG tablet, Take 10 mg by mouth daily., Disp: , Rfl:    ondansetron (ZOFRAN) 4 MG tablet, Take 1 tablet (4 mg total)  by mouth every 8 (eight) hours as needed., Disp: 30 tablet, Rfl: 1   oxyCODONE (ROXICODONE) 5 MG immediate release tablet, Take 1 tablet (5 mg total) by mouth every 4 (four) hours as needed for severe pain or breakthrough pain., Disp: 5 tablet, Rfl: 0   PACLitaxel (TAXOL IV), Inject into the vein once a week., Disp: , Rfl:    Pegfilgrastim-cbqv (UDENYCA [place]), Inject into the skin every 14 (fourteen) days., Disp: , Rfl:    Polyethylene Glycol 400 (VISINE DRY EYE RELIEF OP), Place 1 drop into both eyes daily as needed (dry eye)., Disp: , Rfl:    potassium chloride SA (KLOR-CON M) 20 MEQ tablet, Take 1 tablet (20 mEq total) by mouth daily., Disp: 30 tablet, Rfl: 3   promethazine (PHENERGAN) 25 MG tablet, Take 1 tablet (25 mg total) by mouth every 6 (six) hours as needed for nausea or vomiting., Disp: 30 tablet, Rfl: 3   rOPINIRole (REQUIP) 2 MG tablet, TAKE ONE TABLET BY MOUTH AT BEDTIME, Disp: 30 tablet, Rfl: 9   sucralfate (CARAFATE) 1 GM/10ML suspension, Take 10 mLs (1 g total) by mouth 4 (four) times daily -  with meals and at bedtime., Disp: 420 mL, Rfl: 0   TRINTELLIX 20 MG TABS tablet, Take 20 mg by mouth daily., Disp: , Rfl:    trolamine salicylate (ASPERCREME) 10 % cream, Apply 1 application topically as needed for muscle pain., Disp: , Rfl:    XARELTO 20 MG TABS tablet, , Disp: , Rfl: 2 No current facility-administered medications for this visit.  Facility-Administered Medications Ordered in Other Visits:    sodium chloride flush (NS) 0.9 % injection 10 mL, 10 mL, Intracatheter, PRN, [name], MD, 10 mL at 05/17/23 1330   Allergies: No Known Allergies  REVIEW OF SYSTEMS:   Review of Systems  Constitutional:  Positive for fatigue. Negative for chills and fever.  HENT:   Negative for lump/mass, mouth sores, nosebleeds, sore throat  and trouble swallowing.   Eyes:  Negative for eye problems.  Respiratory:  Negative for cough and shortness of breath.   Cardiovascular:  Negative for chest pain, leg swelling and palpitations.  Gastrointestinal:  Negative for abdominal pain, constipation, diarrhea, nausea and vomiting.  Genitourinary:  Negative for bladder incontinence, difficulty urinating, dysuria, frequency, hematuria and nocturia.   Musculoskeletal:  Negative for arthralgias, back pain, flank pain, myalgias and neck pain.  Skin:  Negative for itching and rash.  Neurological:  Positive for headaches and numbness. Negative for dizziness.  Hematological:  Does not bruise/bleed easily.  Psychiatric/Behavioral:  Negative for depression, sleep disturbance and suicidal ideas. The patient is not nervous/anxious.   All other systems reviewed and are negative.    VITALS:   There were no vitals taken for this visit.  Wt Readings from Last 3 Encounters:  05/17/23 190 lb (86.2 kg)  05/10/23 192 lb 6.4 oz (87.3 kg)  05/03/23 190 lb (86.2 kg)    There is no height or weight on file to calculate BMI.  Performance status (ECOG): 1 - Symptomatic but completely ambulatory  PHYSICAL EXAM:   Physical Exam Vitals and nursing note reviewed. Exam conducted with a chaperone present.  Constitutional:      Appearance: Normal appearance.  Cardiovascular:     Rate and Rhythm: Normal rate and regular rhythm.     Pulses: Normal pulses.     Heart sounds: Normal heart sounds.  Pulmonary:     Effort: Pulmonary effort is normal.     Breath  sounds: Normal breath sounds.  Abdominal:     Palpations: Abdomen is soft. There is no hepatomegaly, splenomegaly or mass.     Tenderness: There is no abdominal tenderness.  Musculoskeletal:     Right lower leg: No edema.     Left lower leg: No edema.  Lymphadenopathy:     Cervical: No cervical adenopathy.     Right cervical: No superficial, deep or posterior cervical adenopathy.    Left  cervical: No superficial, deep or posterior cervical adenopathy.     Upper Body:     Right upper body: No supraclavicular or axillary adenopathy.     Left upper body: No supraclavicular or axillary adenopathy.  Neurological:     General: No focal deficit present.     Mental Status: She is alert and oriented to person, place, and time.  Psychiatric:        Mood and Affect: Mood normal.        Behavior: Behavior normal.     LABS:      Latest Ref Rng & Units 05/17/2023    9:28 AM 05/10/2023    9:09 AM 05/03/2023    9:14 AM  CBC  WBC 4.0 - 10.5 K/uL 6.7  6.1  5.4   Hemoglobin 12.0 - 15.0 g/dL 9.3  8.9  8.6   Hematocrit 36.0 - 46.0 % 28.6  27.4  25.7   Platelets 150 - 400 K/uL 234  241  225       Latest Ref Rng & Units 05/17/2023    9:28 AM 05/10/2023    9:09 AM 05/03/2023    9:14 AM  CMP  Glucose 70 - 99 mg/dL 161  96  096   BUN 6 - 20 mg/dL 11  11  13    Creatinine 0.44 - 1.00 mg/dL 0.45  4.09  8.11   Sodium 135 - 145 mmol/L 137  136  137   Potassium 3.5 - 5.1 mmol/L 3.5  3.5  3.1   Chloride 98 - 111 mmol/L 107  105  105   CO2 22 - 32 mmol/L 24  23  25    Calcium 8.9 - 10.3 mg/dL 8.8  8.5  8.6   Total Protein 6.5 - 8.1 g/dL 6.5  6.3  6.0   Total Bilirubin 0.3 - 1.2 mg/dL 0.3  0.5  0.5   Alkaline Phos 38 - 126 U/L 90  85  83   AST 15 - 41 U/L 33  41  43   ALT 0 - 44 U/L 43  47  45      No results found for: "CEA1", "CEA" / No results found for: "CEA1", "CEA" No results found for: "PSA1" No results found for: "BJY782" No results found for: "CAN125"  No results found for: "TOTALPROTELP", "ALBUMINELP", "A1GS", "A2GS", "BETS", "BETA2SER", "GAMS", "MSPIKE", "SPEI" Lab Results  Component Value Date   FERRITIN 110 11/13/2013   FERRITIN 81 08/07/2013   No results found for: "LDH"   STUDIES:   No results found.

## 2023-05-17 ENCOUNTER — Encounter: Payer: Self-pay | Admitting: Hematology

## 2023-05-17 ENCOUNTER — Inpatient Hospital Stay: Payer: BC Managed Care – PPO

## 2023-05-17 ENCOUNTER — Inpatient Hospital Stay: Payer: BC Managed Care – PPO | Admitting: Hematology

## 2023-05-17 ENCOUNTER — Other Ambulatory Visit: Payer: Self-pay

## 2023-05-17 VITALS — BP 108/67 | HR 84 | Temp 98.0°F | Resp 18

## 2023-05-17 DIAGNOSIS — Z95828 Presence of other vascular implants and grafts: Secondary | ICD-10-CM

## 2023-05-17 DIAGNOSIS — Z5111 Encounter for antineoplastic chemotherapy: Secondary | ICD-10-CM | POA: Diagnosis not present

## 2023-05-17 DIAGNOSIS — C50412 Malignant neoplasm of upper-outer quadrant of left female breast: Secondary | ICD-10-CM | POA: Diagnosis not present

## 2023-05-17 DIAGNOSIS — Z170 Estrogen receptor positive status [ER+]: Secondary | ICD-10-CM

## 2023-05-17 LAB — CBC WITH DIFFERENTIAL/PLATELET
Abs Immature Granulocytes: 0.04 10*3/uL (ref 0.00–0.07)
Basophils Absolute: 0.1 10*3/uL (ref 0.0–0.1)
Basophils Relative: 1 %
Eosinophils Absolute: 0.2 10*3/uL (ref 0.0–0.5)
Eosinophils Relative: 3 %
HCT: 28.6 % — ABNORMAL LOW (ref 36.0–46.0)
Hemoglobin: 9.3 g/dL — ABNORMAL LOW (ref 12.0–15.0)
Immature Granulocytes: 1 %
Lymphocytes Relative: 16 %
Lymphs Abs: 1.1 10*3/uL (ref 0.7–4.0)
MCH: 32.9 pg (ref 26.0–34.0)
MCHC: 32.5 g/dL (ref 30.0–36.0)
MCV: 101.1 fL — ABNORMAL HIGH (ref 80.0–100.0)
Monocytes Absolute: 0.6 10*3/uL (ref 0.1–1.0)
Monocytes Relative: 9 %
Neutro Abs: 4.7 10*3/uL (ref 1.7–7.7)
Neutrophils Relative %: 70 %
Platelets: 234 10*3/uL (ref 150–400)
RBC: 2.83 MIL/uL — ABNORMAL LOW (ref 3.87–5.11)
RDW: 19.3 % — ABNORMAL HIGH (ref 11.5–15.5)
WBC: 6.7 10*3/uL (ref 4.0–10.5)
nRBC: 0 % (ref 0.0–0.2)

## 2023-05-17 LAB — COMPREHENSIVE METABOLIC PANEL
ALT: 43 U/L (ref 0–44)
AST: 33 U/L (ref 15–41)
Albumin: 3.4 g/dL — ABNORMAL LOW (ref 3.5–5.0)
Alkaline Phosphatase: 90 U/L (ref 38–126)
Anion gap: 6 (ref 5–15)
BUN: 11 mg/dL (ref 6–20)
CO2: 24 mmol/L (ref 22–32)
Calcium: 8.8 mg/dL — ABNORMAL LOW (ref 8.9–10.3)
Chloride: 107 mmol/L (ref 98–111)
Creatinine, Ser: 0.54 mg/dL (ref 0.44–1.00)
GFR, Estimated: >60 mL/min (ref >60)
Glucose, Bld: 124 mg/dL — ABNORMAL HIGH (ref 70–99)
Potassium: 3.5 mmol/L (ref 3.5–5.1)
Sodium: 137 mmol/L (ref 135–145)
Total Bilirubin: 0.3 mg/dL (ref 0.3–1.2)
Total Protein: 6.5 g/dL (ref 6.5–8.1)

## 2023-05-17 LAB — MAGNESIUM: Magnesium: 1.8 mg/dL (ref 1.7–2.4)

## 2023-05-17 MED ORDER — SODIUM CHLORIDE 0.9% FLUSH
10.0000 mL | INTRAVENOUS | Status: DC | PRN
  Administered 2023-05-17: 10 mL

## 2023-05-17 MED ORDER — HEPARIN SOD (PORK) LOCK FLUSH 100 UNIT/ML IV SOLN
500.0000 [IU] | Freq: Once | INTRAVENOUS | Status: AC | PRN
  Administered 2023-05-17: 500 [IU]

## 2023-05-17 MED ORDER — SODIUM CHLORIDE 0.9 % IV SOLN
10.0000 mg | Freq: Once | INTRAVENOUS | Status: AC
  Administered 2023-05-17: 10 mg via INTRAVENOUS
  Filled 2023-05-17: qty 10

## 2023-05-17 MED ORDER — GABAPENTIN 300 MG PO CAPS: 300.0000 mg | ORAL_CAPSULE | Freq: Two times a day (BID) | ORAL | 2 refills | Status: DC

## 2023-05-17 MED ORDER — SODIUM CHLORIDE 0.9 % IV SOLN: Freq: Once | INTRAVENOUS | Status: AC

## 2023-05-17 MED ORDER — SODIUM CHLORIDE 0.9 % IV SOLN
64.0000 mg/m2 | Freq: Once | INTRAVENOUS | Status: AC
  Administered 2023-05-17: 126 mg via INTRAVENOUS
  Filled 2023-05-17: qty 21

## 2023-05-17 MED ORDER — CETIRIZINE HCL 10 MG/ML IV SOLN
10.0000 mg | Freq: Once | INTRAVENOUS | Status: AC
  Administered 2023-05-17: 10 mg via INTRAVENOUS
  Filled 2023-05-17: qty 1

## 2023-05-17 MED ORDER — FAMOTIDINE IN NACL 20-0.9 MG/50ML-% IV SOLN
20.0000 mg | Freq: Once | INTRAVENOUS | Status: AC
  Administered 2023-05-17: 20 mg via INTRAVENOUS
  Filled 2023-05-17: qty 50

## 2023-05-17 NOTE — Progress Notes (Signed)
Patient presents today for chemotherapy infusion. Patient is in satisfactory condition with no new complaints voiced.  Vital signs are stable.  Labs reviewed by Dr. Ellin Saba during the office visit and all labs are within treatment parameters.  We will proceed with treatment per MD orders.   Patient tolerated treatment well with no complaints voiced.  Patient left ambulatory with family member  in stable condition.  Vital signs stable at discharge.  Follow up as scheduled.

## 2023-05-17 NOTE — Patient Instructions (Signed)

## 2023-05-17 NOTE — Patient Instructions (Signed)
[facility]  Discharge Instructions: Thank you for choosing [facility] to provide your oncology and hematology care.  If you have a lab appointment with the Cancer Center - please note that after April 8th, 2024, all labs will be drawn in the cancer center.  You do not have to check in or register with the main entrance as you have in the past but will complete your check-in in the cancer center.  Wear comfortable clothing and clothing appropriate for easy access to any Portacath or PICC line.   We strive to give you quality time with your provider. You may need to reschedule your appointment if you arrive late (15 or more minutes).  Arriving late affects you and other patients whose appointments are after yours.  Also, if you miss three or more appointments without notifying the office, you may be dismissed from the clinic at the provider's discretion.      For prescription refill requests, have your pharmacy contact our office and allow 72 hours for refills to be completed.    Today you received the following chemotherapy and/or immunotherapy agents Taxol. Paclitaxel Injection What is this medication? PACLITAXEL (PAK li TAX el) treats some types of cancer. It works by slowing down the growth of cancer cells. This medicine may be used for other purposes; ask your health care provider or pharmacist if you have questions. COMMON BRAND NAME(S): Onxol, Taxol What should I tell my care team before I take this medication? They need to know if you have any of these conditions: Heart disease Liver disease Low white blood cell levels An unusual or allergic reaction to paclitaxel, other medications, foods, dyes, or preservatives If you or your partner are pregnant or trying to get pregnant Breast-feeding How should I use this medication? This medication is injected into a vein. It is given by your care team in a hospital or clinic setting. Talk to your care team  about the use of this medication in children. While it may be given to children for selected conditions, precautions do apply. Overdosage: If you think you have taken too much of this medicine contact a poison control center or emergency room at once. NOTE: This medicine is only for you. Do not share this medicine with others. What if I miss a dose? Keep appointments for follow-up doses. It is important not to miss your dose. Call your care team if you are unable to keep an appointment. What may interact with this medication? Do not take this medication with any of the following: Live virus vaccines Other medications may affect the way this medication works. Talk with your care team about all of the medications you take. They may suggest changes to your treatment plan to lower the risk of side effects and to make sure your medications work as intended. This list may not describe all possible interactions. Give your health care provider a list of all the medicines, herbs, non-prescription drugs, or dietary supplements you use. Also tell them if you smoke, drink alcohol, or use illegal drugs. Some items may interact with your medicine. What should I watch for while using this medication? Your condition will be monitored carefully while you are receiving this medication. You may need blood work while taking this medication. This medication may make you feel generally unwell. This is not uncommon as chemotherapy can affect healthy cells as well as cancer cells. Report any side effects. Continue your course of treatment even though you  feel ill unless your care team tells you to stop. This medication can cause serious allergic reactions. To reduce the risk, your care team may give you other medications to take before receiving this one. Be sure to follow the directions from your care team. This medication may increase your risk of getting an infection. Call your care team for advice if you get a fever,  chills, sore throat, or other symptoms of a cold or flu. Do not treat yourself. Try to avoid being around people who are sick. This medication may increase your risk to bruise or bleed. Call your care team if you notice any unusual bleeding. Be careful brushing or flossing your teeth or using a toothpick because you may get an infection or bleed more easily. If you have any dental work done, tell your dentist you are receiving this medication. Talk to your care team if you may be pregnant. Serious birth defects can occur if you take this medication during pregnancy. Talk to your care team before breastfeeding. Changes to your treatment plan may be needed. What side effects may I notice from receiving this medication? Side effects that you should report to your care team as soon as possible: Allergic reactions--skin rash, itching, hives, swelling of the face, lips, tongue, or throat Heart rhythm changes--fast or irregular heartbeat, dizziness, feeling faint or lightheaded, chest pain, trouble breathing Increase in blood pressure Infection--fever, chills, cough, sore throat, wounds that don't heal, pain or trouble when passing urine, general feeling of discomfort or being unwell Low blood pressure--dizziness, feeling faint or lightheaded, blurry vision Low red blood cell level--unusual weakness or fatigue, dizziness, headache, trouble breathing Painful swelling, warmth, or redness of the skin, blisters or sores at the infusion site Pain, tingling, or numbness in the hands or feet Slow heartbeat--dizziness, feeling faint or lightheaded, confusion, trouble breathing, unusual weakness or fatigue Unusual bruising or bleeding Side effects that usually do not require medical attention (report to your care team if they continue or are bothersome): Diarrhea Hair loss Joint pain Loss of appetite Muscle pain Nausea Vomiting This list may not describe all possible side effects. Call your doctor for  medical advice about side effects. You may report side effects to FDA at 1-800-FDA-1088. Where should I keep my medication? This medication is given in a hospital or clinic. It will not be stored at home. NOTE: This sheet is a summary. It may not cover all possible information. If you have questions about this medicine, talk to your doctor, pharmacist, or health care provider.  2024 Elsevier/Gold Standard (2022-03-09 00:00:00)       To help prevent nausea and vomiting after your treatment, we encourage you to take your nausea medication as directed.  BELOW ARE SYMPTOMS THAT SHOULD BE REPORTED IMMEDIATELY: *FEVER GREATER THAN 100.4 F (38 C) OR HIGHER *CHILLS OR SWEATING *NAUSEA AND VOMITING THAT IS NOT CONTROLLED WITH YOUR NAUSEA MEDICATION *UNUSUAL SHORTNESS OF BREATH *UNUSUAL BRUISING OR BLEEDING *URINARY PROBLEMS (pain or burning when urinating, or frequent urination) *BOWEL PROBLEMS (unusual diarrhea, constipation, pain near the anus) TENDERNESS IN MOUTH AND THROAT WITH OR WITHOUT PRESENCE OF ULCERS (sore throat, sores in mouth, or a toothache) UNUSUAL RASH, SWELLING OR PAIN  UNUSUAL VAGINAL DISCHARGE OR ITCHING   Items with * indicate a potential emergency and should be followed up as soon as possible or go to the Emergency Department if any problems should occur.  Please show the CHEMOTHERAPY ALERT CARD or IMMUNOTHERAPY ALERT CARD at check-in to the Emergency  Department and triage nurse.  Should you have questions after your visit or need to cancel or reschedule your appointment, please contact [facility] [phone]  and follow the prompts.  Office hours are 8:00 a.m. to 4:30 p.m. Monday - Friday. Please note that voicemails left after 4:00 p.m. may not be returned until the following business day.  We are closed weekends and major holidays. You have access to a nurse at all times for urgent questions. Please call the main number to the clinic [phone] and  follow the prompts.  For any non-urgent questions, you may also contact your provider using MyChart. We now offer e-Visits for anyone 51 and older to request care online for non-urgent symptoms. For details visit [email or web address].   Also download the MyChart app! Go to the app store, search "MyChart", open the app, select [place], and log in with your MyChart username and password.

## 2023-05-17 NOTE — Progress Notes (Signed)
Patient has been examined by Dr. Katragadda. Vital signs and labs have been reviewed by MD - ANC, Creatinine, LFTs, hemoglobin, and platelets are within treatment parameters per M.D. - pt may proceed with treatment.  Primary RN and pharmacy notified.  

## 2023-05-19 ENCOUNTER — Other Ambulatory Visit: Payer: Self-pay

## 2023-05-24 ENCOUNTER — Inpatient Hospital Stay: Payer: BC Managed Care – PPO

## 2023-05-24 ENCOUNTER — Inpatient Hospital Stay: Payer: BC Managed Care – PPO | Admitting: Hematology

## 2023-05-24 VITALS — BP 123/58 | HR 80 | Temp 98.2°F | Resp 18

## 2023-05-24 DIAGNOSIS — Z95828 Presence of other vascular implants and grafts: Secondary | ICD-10-CM

## 2023-05-24 DIAGNOSIS — Z170 Estrogen receptor positive status [ER+]: Secondary | ICD-10-CM

## 2023-05-24 DIAGNOSIS — Z5111 Encounter for antineoplastic chemotherapy: Secondary | ICD-10-CM | POA: Diagnosis not present

## 2023-05-24 LAB — CBC WITH DIFFERENTIAL/PLATELET
Abs Immature Granulocytes: 0.03 10*3/uL (ref 0.00–0.07)
Basophils Absolute: 0.1 10*3/uL (ref 0.0–0.1)
Basophils Relative: 1 %
Eosinophils Absolute: 0.3 10*3/uL (ref 0.0–0.5)
Eosinophils Relative: 4 %
HCT: 30.1 % — ABNORMAL LOW (ref 36.0–46.0)
Hemoglobin: 9.9 g/dL — ABNORMAL LOW (ref 12.0–15.0)
Immature Granulocytes: 0 %
Lymphocytes Relative: 20 %
Lymphs Abs: 1.3 10*3/uL (ref 0.7–4.0)
MCH: 33.6 pg (ref 26.0–34.0)
MCHC: 32.9 g/dL (ref 30.0–36.0)
MCV: 102 fL — ABNORMAL HIGH (ref 80.0–100.0)
Monocytes Absolute: 0.5 10*3/uL (ref 0.1–1.0)
Monocytes Relative: 8 %
Neutro Abs: 4.5 10*3/uL (ref 1.7–7.7)
Neutrophils Relative %: 67 %
Platelets: 233 10*3/uL (ref 150–400)
RBC: 2.95 MIL/uL — ABNORMAL LOW (ref 3.87–5.11)
RDW: 18.3 % — ABNORMAL HIGH (ref 11.5–15.5)
WBC: 6.7 10*3/uL (ref 4.0–10.5)
nRBC: 0 % (ref 0.0–0.2)

## 2023-05-24 LAB — COMPREHENSIVE METABOLIC PANEL
ALT: 42 U/L (ref 0–44)
AST: 36 U/L (ref 15–41)
Albumin: 3.4 g/dL — ABNORMAL LOW (ref 3.5–5.0)
Alkaline Phosphatase: 91 U/L (ref 38–126)
Anion gap: 11 (ref 5–15)
BUN: 11 mg/dL (ref 6–20)
CO2: 19 mmol/L — ABNORMAL LOW (ref 22–32)
Calcium: 8.7 mg/dL — ABNORMAL LOW (ref 8.9–10.3)
Chloride: 103 mmol/L (ref 98–111)
Creatinine, Ser: 0.52 mg/dL (ref 0.44–1.00)
GFR, Estimated: >60 mL/min (ref >60)
Glucose, Bld: 127 mg/dL — ABNORMAL HIGH (ref 70–99)
Potassium: 3.5 mmol/L (ref 3.5–5.1)
Sodium: 133 mmol/L — ABNORMAL LOW (ref 135–145)
Total Bilirubin: 0.5 mg/dL (ref 0.3–1.2)
Total Protein: 6.8 g/dL (ref 6.5–8.1)

## 2023-05-24 LAB — VITAMIN D 25 HYDROXY (VIT D DEFICIENCY, FRACTURES): Vit D, 25-Hydroxy: 21.1 ng/mL — ABNORMAL LOW (ref 30–100)

## 2023-05-24 LAB — MAGNESIUM: Magnesium: 1.7 mg/dL (ref 1.7–2.4)

## 2023-05-24 MED ORDER — CETIRIZINE HCL 10 MG/ML IV SOLN
10.0000 mg | Freq: Once | INTRAVENOUS | Status: AC
  Administered 2023-05-24: 10 mg via INTRAVENOUS
  Filled 2023-05-24: qty 1

## 2023-05-24 MED ORDER — FAMOTIDINE IN NACL 20-0.9 MG/50ML-% IV SOLN
20.0000 mg | Freq: Once | INTRAVENOUS | Status: AC
  Administered 2023-05-24: 20 mg via INTRAVENOUS
  Filled 2023-05-24: qty 50

## 2023-05-24 MED ORDER — SODIUM CHLORIDE 0.9 % IV SOLN: Freq: Once | INTRAVENOUS | Status: AC

## 2023-05-24 MED ORDER — HEPARIN SOD (PORK) LOCK FLUSH 100 UNIT/ML IV SOLN
500.0000 [IU] | Freq: Once | INTRAVENOUS | Status: AC | PRN
  Administered 2023-05-24: 500 [IU]

## 2023-05-24 MED ORDER — SODIUM CHLORIDE 0.9% FLUSH
10.0000 mL | INTRAVENOUS | Status: DC | PRN
  Administered 2023-05-24: 10 mL via INTRAVENOUS

## 2023-05-24 MED ORDER — SODIUM CHLORIDE 0.9 % IV SOLN
10.0000 mg | Freq: Once | INTRAVENOUS | Status: AC
  Administered 2023-05-24: 10 mg via INTRAVENOUS
  Filled 2023-05-24: qty 10

## 2023-05-24 MED ORDER — SODIUM CHLORIDE 0.9 % IV SOLN
64.0000 mg/m2 | Freq: Once | INTRAVENOUS | Status: AC
  Administered 2023-05-24: 126 mg via INTRAVENOUS
  Filled 2023-05-24: qty 21

## 2023-05-24 MED ORDER — SODIUM CHLORIDE 0.9% FLUSH
10.0000 mL | INTRAVENOUS | Status: DC | PRN
  Administered 2023-05-24: 10 mL

## 2023-05-24 NOTE — Progress Notes (Signed)
Patient presents today for chemotherapy infusion of Taxol.  Patient is in satisfactory condition with no new complaints voiced.  Vital signs are stable, original HR 103 upon arrival,-checked before treatment 85. Labs reviewed and all labs are within treatment parameters.  We will proceed with treatment per MD orders.  Patient tolerated treatment well with no complaints voiced.  Patient left ambulatory in stable condition.  Vital signs stable at discharge.  Follow up as scheduled.

## 2023-05-24 NOTE — Progress Notes (Signed)
Patients port flushed without difficulty.  Good blood return noted with no bruising or swelling noted at site.  Patient remains accessed for treatment.  

## 2023-05-24 NOTE — Patient Instructions (Signed)
[facility]  Discharge Instructions: Thank you for choosing [facility] to provide your oncology and hematology care.  If you have a lab appointment with the Cancer Center - please note that after April 8th, 2024, all labs will be drawn in the cancer center.  You do not have to check in or register with the main entrance as you have in the past but will complete your check-in in the cancer center.  Wear comfortable clothing and clothing appropriate for easy access to any Portacath or PICC line.   We strive to give you quality time with your provider. You may need to reschedule your appointment if you arrive late (15 or more minutes).  Arriving late affects you and other patients whose appointments are after yours.  Also, if you miss three or more appointments without notifying the office, you may be dismissed from the clinic at the provider's discretion.      For prescription refill requests, have your pharmacy contact our office and allow 72 hours for refills to be completed.    Today you received the following chemotherapy and/or immunotherapy agents Taxol. Paclitaxel Injection What is this medication? PACLITAXEL (PAK li TAX el) treats some types of cancer. It works by slowing down the growth of cancer cells. This medicine may be used for other purposes; ask your health care provider or pharmacist if you have questions. COMMON BRAND NAME(S): Onxol, Taxol What should I tell my care team before I take this medication? They need to know if you have any of these conditions: Heart disease Liver disease Low white blood cell levels An unusual or allergic reaction to paclitaxel, other medications, foods, dyes, or preservatives If you or your partner are pregnant or trying to get pregnant Breast-feeding How should I use this medication? This medication is injected into a vein. It is given by your care team in a hospital or clinic setting. Talk to your care team  about the use of this medication in children. While it may be given to children for selected conditions, precautions do apply. Overdosage: If you think you have taken too much of this medicine contact a poison control center or emergency room at once. NOTE: This medicine is only for you. Do not share this medicine with others. What if I miss a dose? Keep appointments for follow-up doses. It is important not to miss your dose. Call your care team if you are unable to keep an appointment. What may interact with this medication? Do not take this medication with any of the following: Live virus vaccines Other medications may affect the way this medication works. Talk with your care team about all of the medications you take. They may suggest changes to your treatment plan to lower the risk of side effects and to make sure your medications work as intended. This list may not describe all possible interactions. Give your health care provider a list of all the medicines, herbs, non-prescription drugs, or dietary supplements you use. Also tell them if you smoke, drink alcohol, or use illegal drugs. Some items may interact with your medicine. What should I watch for while using this medication? Your condition will be monitored carefully while you are receiving this medication. You may need blood work while taking this medication. This medication may make you feel generally unwell. This is not uncommon as chemotherapy can affect healthy cells as well as cancer cells. Report any side effects. Continue your course of treatment even though you  feel ill unless your care team tells you to stop. This medication can cause serious allergic reactions. To reduce the risk, your care team may give you other medications to take before receiving this one. Be sure to follow the directions from your care team. This medication may increase your risk of getting an infection. Call your care team for advice if you get a fever,  chills, sore throat, or other symptoms of a cold or flu. Do not treat yourself. Try to avoid being around people who are sick. This medication may increase your risk to bruise or bleed. Call your care team if you notice any unusual bleeding. Be careful brushing or flossing your teeth or using a toothpick because you may get an infection or bleed more easily. If you have any dental work done, tell your dentist you are receiving this medication. Talk to your care team if you may be pregnant. Serious birth defects can occur if you take this medication during pregnancy. Talk to your care team before breastfeeding. Changes to your treatment plan may be needed. What side effects may I notice from receiving this medication? Side effects that you should report to your care team as soon as possible: Allergic reactions--skin rash, itching, hives, swelling of the face, lips, tongue, or throat Heart rhythm changes--fast or irregular heartbeat, dizziness, feeling faint or lightheaded, chest pain, trouble breathing Increase in blood pressure Infection--fever, chills, cough, sore throat, wounds that don't heal, pain or trouble when passing urine, general feeling of discomfort or being unwell Low blood pressure--dizziness, feeling faint or lightheaded, blurry vision Low red blood cell level--unusual weakness or fatigue, dizziness, headache, trouble breathing Painful swelling, warmth, or redness of the skin, blisters or sores at the infusion site Pain, tingling, or numbness in the hands or feet Slow heartbeat--dizziness, feeling faint or lightheaded, confusion, trouble breathing, unusual weakness or fatigue Unusual bruising or bleeding Side effects that usually do not require medical attention (report to your care team if they continue or are bothersome): Diarrhea Hair loss Joint pain Loss of appetite Muscle pain Nausea Vomiting This list may not describe all possible side effects. Call your doctor for  medical advice about side effects. You may report side effects to FDA at 1-800-FDA-1088. Where should I keep my medication? This medication is given in a hospital or clinic. It will not be stored at home. NOTE: This sheet is a summary. It may not cover all possible information. If you have questions about this medicine, talk to your doctor, pharmacist, or health care provider.  2024 Elsevier/Gold Standard (2022-03-09 00:00:00)       To help prevent nausea and vomiting after your treatment, we encourage you to take your nausea medication as directed.  BELOW ARE SYMPTOMS THAT SHOULD BE REPORTED IMMEDIATELY: *FEVER GREATER THAN 100.4 F (38 C) OR HIGHER *CHILLS OR SWEATING *NAUSEA AND VOMITING THAT IS NOT CONTROLLED WITH YOUR NAUSEA MEDICATION *UNUSUAL SHORTNESS OF BREATH *UNUSUAL BRUISING OR BLEEDING *URINARY PROBLEMS (pain or burning when urinating, or frequent urination) *BOWEL PROBLEMS (unusual diarrhea, constipation, pain near the anus) TENDERNESS IN MOUTH AND THROAT WITH OR WITHOUT PRESENCE OF ULCERS (sore throat, sores in mouth, or a toothache) UNUSUAL RASH, SWELLING OR PAIN  UNUSUAL VAGINAL DISCHARGE OR ITCHING   Items with * indicate a potential emergency and should be followed up as soon as possible or go to the Emergency Department if any problems should occur.  Please show the CHEMOTHERAPY ALERT CARD or IMMUNOTHERAPY ALERT CARD at check-in to the Emergency  Department and triage nurse.  Should you have questions after your visit or need to cancel or reschedule your appointment, please contact [facility] [phone]  and follow the prompts.  Office hours are 8:00 a.m. to 4:30 p.m. Monday - Friday. Please note that voicemails left after 4:00 p.m. may not be returned until the following business day.  We are closed weekends and major holidays. You have access to a nurse at all times for urgent questions. Please call the main number to the clinic [phone] and  follow the prompts.  For any non-urgent questions, you may also contact your provider using MyChart. We now offer e-Visits for anyone 51 and older to request care online for non-urgent symptoms. For details visit [email or web address].   Also download the MyChart app! Go to the app store, search "MyChart", open the app, select [place], and log in with your MyChart username and password.

## 2023-05-25 ENCOUNTER — Other Ambulatory Visit: Payer: Self-pay

## 2023-05-30 NOTE — Progress Notes (Signed)
[facility] [address], [place] [address]    Clinic Day:  05/31/2023  Referring physician: [name], PA-C  Patient Care Team: [name], PA-C as PCP - General (Physician Assistant) [name], MD as Medical Oncologist (Medical Oncology)   ASSESSMENT & PLAN:   Assessment: 1.  Stage I (T1c N1 M0, G2, ER/PR+, HER2-) left breast UOQ IDC: - Screening mammogram (12/01/2022): Possible distortion in the left breast.  Right breast has no findings suspicious for malignancy. - Left breast diagnostic mammogram/US (12/14/2022): Irregular hypoechoic mass at the 1 o'clock position of the left breast, 4 cm from nipple, measuring 1 x 0.7 x 0.9 cm.  No pathologic lymphadenopathy in the left axilla. - Left breast biopsy (12/16/2022): Invasive ductal carcinoma, grade 2, ER/PR 100%, Ki-67 1%, HER2 0 by IHC. - Left breast lumpectomy and SLNB on 01/10/2023. - Pathology: 1.4 x 1.1 x 1.1 cm moderately differentiated IDC, grade 2, margins negative.  1 sentinel lymph node with metastatic carcinoma (1.1 mm).  1 lymph node with micrometastasis (1 mm).  3 other sentinel lymph nodes negative for invasive carcinoma.  PT1 cpN1a. - Oncotype DX recurrence score 14. - CT CAP (02/08/2023): Left Staebler 1.4 cm sclerotic lesion indeterminate.  No evidence of metastatic disease.  Right nephrolithiasis. - PET scan (02/10/2023): Postop changes in the left breast and axilla.  No uptake in the left established lesion. - 2D echo (02/16/2023): LVEF 55 to 60%. - Cycle 1 AC on 02/22/2023   2.  Social/family history: - She lives at home with her husband.  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  No prior history of smoking. - Paternal grandmother had breast cancer.  Brother had kidney cancer.  Father had prostate cancer.   3.  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  Probable infarct in  the left lung base. - Heterozygosity for factor V Leiden mutation.  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  PT mutation negative.  Protein C, protein S, AT III normal. - She has been on Xarelto since then.   4.  Bone health: - DEXA scan (01/04/2023): T-score -0.3, normal.    Plan: 1.  Stage IB (T1c N1 M0, G2, ER/PR+, HER2-) left breast UOQ IDC: - She is tolerating weekly paclitaxel reasonably well. - She reported worsening tenderness/sensitivity in the fingertips for the last 1 week.  She has difficulty opening bottle caps and occasionally drops things from her hands. - She also reported dull pain in the back of the neck for the last couple of weeks and takes ibuprofen 3 tablets once daily. - Reviewed labs today: Normal LFTs and creatinine.  CBC grossly normal. - Will proceed with next cycle today with 20% dose reduction.  RTC 2 weeks for follow-up.   2.  Peripheral neuropathy: - She has soreness in the fingertips and has difficulty opening bottle caps and occasionally drops objects. - Will start her on gabapentin 300 mg twice daily.  Paclitaxel dose reduced by 20%.   3.  Restless leg syndrome: - Continue Requip 2 mg half tablet in the morning and whole tablet at nighttime.   4.  Hypokalemia: - Continue potassium 20 mill colons daily.  Potassium is normal today.       No orders of the defined types were placed in this encounter.      Alben Deeds Teague,acting as a Neurosurgeon for [name], MD.,have documented all relevant documentation on the  behalf of [name], MD,as directed by  [name], MD while in the presence of [name], MD.  ***    [place] R Teague   7/29/20249:19 AM  CHIEF COMPLAINT:   Diagnosis: left breast cancer    Cancer Staging  Breast cancer of upper-outer quadrant of left female breast [facility]) Staging form: Breast, AJCC 8th Edition - Clinical stage from 12/23/2022: Stage IB (cT1c, cN1(sn), cM0, G2, ER+, PR+, HER2-) - Unsigned     Prior Therapy: 1. Lumpectomy and SLNB, 01/10/23  2. Dose dense AC, 4 cycles, 02/22/23 - 04/05/23  Current Therapy:  weekly paclitaxel    HISTORY OF PRESENT ILLNESS:   Oncology History  Breast cancer of upper-outer quadrant of left female breast (HCC)  12/23/2022 Initial Diagnosis   Breast cancer of upper-outer quadrant of left female breast   02/22/2023 -  Chemotherapy   Patient is on Treatment Plan : BREAST ADJUVANT DOSE DENSE AC q14d / PACLitaxel q7d        INTERVAL HISTORY:   Judy Patton is a 52 y.o. female presenting to clinic today for follow up of left breast cancer. She was last seen by me on 05/17/23.  Today, she states that she is doing well overall. Her appetite level is at ***%. Her energy level is at ***%.  PAST MEDICAL HISTORY:   Past Medical History: Past Medical History:  Diagnosis Date   Anxiety    Chronic headaches    Depression    Factor V Leiden mutation (HCC)    Fatigue 02/18/2015   History of kidney stones    Hypothyroidism    MRSA (methicillin resistant Staphylococcus aureus)    Pain of tooth socket 03/14/2015   Pancreatitis    PONV (postoperative nausea and vomiting)    Port-A-Cath in place 02/14/2023   Pulmonary embolus (HCC)    Restless leg syndrome    Shingles rash 03/14/2015   Shoulder injury    Stress    family   Weight gain 02/18/2015    Surgical History: Past Surgical History:  Procedure Laterality Date   BREAST BIOPSY Left 12/16/2022   [place] LT BREAST BX W LOC DEV 1ST LESION IMG BX SPEC US GUIDE 12/16/2022 [facility]   BREAST BIOPSY Left 01/04/2023   [place] LT RADIO FREQUENCY TAG LOC US GUIDE 01/04/2023 Edwin Cap, MD [facility]   COLONOSCOPY  03/25/2004   ONG:EXBM canal hemorrhoids, otherwise normal rectum,   DILITATION & CURRETTAGE/HYSTROSCOPY WITH NOVASURE ABLATION N/A 11/08/2017   Procedure: DILATATION & CURETTAGE/HYSTEROSCOPY WITH NOVASURE ENDOMETRIAL ABLATION;  Surgeon: Tilda Burrow, MD;  Location: [facility];  Service: Gynecology;   Laterality: N/A;   ESOPHAGOGASTRODUODENOSCOPY  03/25/2004   WUX:LKGMWN esophagus, small hiatal hernia, otherwise normal stomach   kidney stones  05/2014,11/2014   LAPAROSCOPIC BILATERAL SALPINGECTOMY Bilateral 11/08/2017   Procedure: LAPAROSCOPIC BILATERAL SALPINGECTOMY;  Surgeon: Tilda Burrow, MD;  Location: [facility];  Service: Gynecology;  Laterality: Bilateral;   LITHOTRIPSY  2008   PARTIAL MASTECTOMY WITH AXILLARY SENTINEL LYMPH NODE BIOPSY Left 01/10/2023   Procedure: PARTIAL MASTECTOMY WITH AXILLARY SENTINEL LYMPH NODE BIOPSY AND RADIOFREQUENCY TAG PLACEMENT;  Surgeon: Lucretia Roers, MD;  Location: [facility];  Service: General;  Laterality: Left;   PORTACATH PLACEMENT Right 02/11/2023   Procedure: INSERTION PORT-A-CATH;  Surgeon: Lucretia Roers, MD;  Location: [facility];  Service: General;  Laterality: Right;    Social History: Social History   Socioeconomic History   Marital status: Married    Spouse name: Not on file   Number  of children: Not on file   Years of education: Not on file   Highest education level: Not on file  Occupational History   Not on file  Tobacco Use   Smoking status: Never   Smokeless tobacco: Never  Vaping Use   Vaping status: Never Used  Substance and Sexual Activity   Alcohol use: Yes    Comment: occ   Drug use: No   Sexual activity: Not Currently    Birth control/protection: Surgical  Other Topics Concern   Not on file  Social History Narrative   Not on file   Social Determinants of Health   Financial Resource Strain: Not on file  Food Insecurity: No Food Insecurity (12/23/2022)   Hunger Vital Sign    Worried About Running Out of Food in the Last Year: Never true    Ran Out of Food in the Last Year: Never true  Transportation Needs: No Transportation Needs (12/23/2022)   PRAPARE - Transportation    Lack of Transportation (Medical): No    Lack of Transportation (Non-Medical): No  Physical Activity: Not on file  Stress: Not on file   Social Connections: Unknown (03/12/2022)   Received from [facility]   Social Network    Social Network: Not on file  Intimate Partner Violence: Not At Risk (12/23/2022)   Humiliation, Afraid, Rape, and Kick questionnaire    Fear of Current or Ex-Partner: No    Emotionally Abused: No    Physically Abused: No    Sexually Abused: No    Family History: Family History  Problem Relation Age of Onset   Stroke Mother    CAD Mother    Prostate cancer Father 63   Kidney cancer Brother        dx 51s   Factor V Leiden deficiency Brother    Fibromyalgia Brother    Breast cancer Paternal Grandmother        dx >50   Lung cancer Paternal Grandfather     Current Medications:  Current Outpatient Medications:    acetaminophen (TYLENOL) 500 MG tablet, Take 1,000 mg by mouth every 6 (six) hours as needed for moderate pain or headache., Disp: , Rfl:    aluminum-magnesium hydroxide 200-200 MG/5ML suspension, Take 15 mLs by mouth 4 (four) times daily as needed for indigestion. Mix 1:1 with lidocaine and swish and swallow, Disp: 355 mL, Rfl: 0   gabapentin (NEURONTIN) 300 MG capsule, Take 1 capsule (300 mg total) by mouth 2 (two) times daily., Disp: 60 capsule, Rfl: 2   ibuprofen (ADVIL,MOTRIN) 200 MG tablet, Take 400 mg by mouth every 6 (six) hours as needed for headache or moderate pain., Disp: , Rfl:    levothyroxine (SYNTHROID, LEVOTHROID) 88 MCG tablet, TAKE 88 MCG BY MOUTH IN THE MORNING, Disp: 90 tablet, Rfl: 3   lidocaine (XYLOCAINE) 2 % solution, Use as directed 15 mLs in the mouth or throat 4 (four) times daily as needed for mouth pain. Mix 1:1 with maalox and swish and swallow, Disp: 100 mL, Rfl: 0   lidocaine-prilocaine (EMLA) cream, Apply a quarter sized amount to port a cath site and cover with plastic wrap 1 hour prior to infusion appointments, Disp: 30 g, Rfl: 3   loratadine (CLARITIN) 10 MG tablet, Take 10 mg by mouth daily., Disp: , Rfl:    ondansetron (ZOFRAN) 4 MG tablet, Take 1  tablet (4 mg total) by mouth every 8 (eight) hours as needed., Disp: 30 tablet, Rfl: 1   oxyCODONE (ROXICODONE) 5  MG immediate release tablet, Take 1 tablet (5 mg total) by mouth every 4 (four) hours as needed for severe pain or breakthrough pain., Disp: 5 tablet, Rfl: 0   PACLitaxel (TAXOL IV), Inject into the vein once a week., Disp: , Rfl:    Pegfilgrastim-cbqv (UDENYCA [place]), Inject into the skin every 14 (fourteen) days., Disp: , Rfl:    Polyethylene Glycol 400 (VISINE DRY EYE RELIEF OP), Place 1 drop into both eyes daily as needed (dry eye)., Disp: , Rfl:    potassium chloride SA (KLOR-CON M) 20 MEQ tablet, Take 1 tablet (20 mEq total) by mouth daily., Disp: 30 tablet, Rfl: 3   promethazine (PHENERGAN) 25 MG tablet, Take 1 tablet (25 mg total) by mouth every 6 (six) hours as needed for nausea or vomiting., Disp: 30 tablet, Rfl: 3   rOPINIRole (REQUIP) 2 MG tablet, TAKE ONE TABLET BY MOUTH AT BEDTIME, Disp: 30 tablet, Rfl: 9   sucralfate (CARAFATE) 1 GM/10ML suspension, Take 10 mLs (1 g total) by mouth 4 (four) times daily -  with meals and at bedtime., Disp: 420 mL, Rfl: 0   TRINTELLIX 20 MG TABS tablet, Take 20 mg by mouth daily., Disp: , Rfl:    trolamine salicylate (ASPERCREME) 10 % cream, Apply 1 application topically as needed for muscle pain., Disp: , Rfl:    XARELTO 20 MG TABS tablet, , Disp: , Rfl: 2   Allergies: No Known Allergies  REVIEW OF SYSTEMS:   Review of Systems  Constitutional:  Negative for chills, fatigue and fever.  HENT:   Negative for lump/mass, mouth sores, nosebleeds, sore throat and trouble swallowing.   Eyes:  Negative for eye problems.  Respiratory:  Negative for cough and shortness of breath.   Cardiovascular:  Negative for chest pain, leg swelling and palpitations.  Gastrointestinal:  Negative for abdominal pain, constipation, diarrhea, nausea and vomiting.  Genitourinary:  Negative for bladder incontinence, difficulty urinating, dysuria, frequency,  hematuria and nocturia.   Musculoskeletal:  Negative for arthralgias, back pain, flank pain, myalgias and neck pain.  Skin:  Negative for itching and rash.  Neurological:  Negative for dizziness, headaches and numbness.  Hematological:  Does not bruise/bleed easily.  Psychiatric/Behavioral:  Negative for depression, sleep disturbance and suicidal ideas. The patient is not nervous/anxious.   All other systems reviewed and are negative.    VITALS:   There were no vitals taken for this visit.  Wt Readings from Last 3 Encounters:  05/24/23 196 lb 6.4 oz (89.1 kg)  05/17/23 190 lb (86.2 kg)  05/10/23 192 lb 6.4 oz (87.3 kg)    There is no height or weight on file to calculate BMI.  Performance status (ECOG): 1 - Symptomatic but completely ambulatory  PHYSICAL EXAM:   Physical Exam Vitals and nursing note reviewed. Exam conducted with a chaperone present.  Constitutional:      Appearance: Normal appearance.  Cardiovascular:     Rate and Rhythm: Normal rate and regular rhythm.     Pulses: Normal pulses.     Heart sounds: Normal heart sounds.  Pulmonary:     Effort: Pulmonary effort is normal.     Breath sounds: Normal breath sounds.  Abdominal:     Palpations: Abdomen is soft. There is no hepatomegaly, splenomegaly or mass.     Tenderness: There is no abdominal tenderness.  Musculoskeletal:     Right lower leg: No edema.     Left lower leg: No edema.  Lymphadenopathy:     Cervical:  No cervical adenopathy.     Right cervical: No superficial, deep or posterior cervical adenopathy.    Left cervical: No superficial, deep or posterior cervical adenopathy.     Upper Body:     Right upper body: No supraclavicular or axillary adenopathy.     Left upper body: No supraclavicular or axillary adenopathy.  Neurological:     General: No focal deficit present.     Mental Status: She is alert and oriented to person, place, and time.  Psychiatric:        Mood and Affect: Mood normal.         Behavior: Behavior normal.     LABS:      Latest Ref Rng & Units 05/24/2023   10:36 AM 05/17/2023    9:28 AM 05/10/2023    9:09 AM  CBC  WBC 4.0 - 10.5 K/uL 6.7  6.7  6.1   Hemoglobin 12.0 - 15.0 g/dL 9.9  9.3  8.9   Hematocrit 36.0 - 46.0 % 30.1  28.6  27.4   Platelets 150 - 400 K/uL 233  234  241       Latest Ref Rng & Units 05/24/2023   10:36 AM 05/17/2023    9:28 AM 05/10/2023    9:09 AM  CMP  Glucose 70 - 99 mg/dL 063  016  96   BUN 6 - 20 mg/dL 11  11  11    Creatinine 0.44 - 1.00 mg/dL 0.10  9.32  3.55   Sodium 135 - 145 mmol/L 133  137  136   Potassium 3.5 - 5.1 mmol/L 3.5  3.5  3.5   Chloride 98 - 111 mmol/L 103  107  105   CO2 22 - 32 mmol/L 19  24  23    Calcium 8.9 - 10.3 mg/dL 8.7  8.8  8.5   Total Protein 6.5 - 8.1 g/dL 6.8  6.5  6.3   Total Bilirubin 0.3 - 1.2 mg/dL 0.5  0.3  0.5   Alkaline Phos 38 - 126 U/L 91  90  85   AST 15 - 41 U/L 36  33  41   ALT 0 - 44 U/L 42  43  47      No results found for: "CEA1", "CEA" / No results found for: "CEA1", "CEA" No results found for: "PSA1" No results found for: "DDU202" No results found for: "CAN125"  No results found for: "TOTALPROTELP", "ALBUMINELP", "A1GS", "A2GS", "BETS", "BETA2SER", "GAMS", "MSPIKE", "SPEI" Lab Results  Component Value Date   FERRITIN 110 11/13/2013   FERRITIN 81 08/07/2013   No results found for: "LDH"   STUDIES:   No results found.

## 2023-05-31 ENCOUNTER — Inpatient Hospital Stay: Payer: BC Managed Care – PPO | Admitting: Hematology

## 2023-05-31 ENCOUNTER — Inpatient Hospital Stay: Payer: BC Managed Care – PPO

## 2023-05-31 VITALS — BP 116/78 | HR 98 | Temp 97.6°F | Resp 18

## 2023-05-31 DIAGNOSIS — Z95828 Presence of other vascular implants and grafts: Secondary | ICD-10-CM

## 2023-05-31 DIAGNOSIS — Z170 Estrogen receptor positive status [ER+]: Secondary | ICD-10-CM

## 2023-05-31 DIAGNOSIS — Z5111 Encounter for antineoplastic chemotherapy: Secondary | ICD-10-CM | POA: Diagnosis not present

## 2023-05-31 DIAGNOSIS — C50412 Malignant neoplasm of upper-outer quadrant of left female breast: Secondary | ICD-10-CM

## 2023-05-31 LAB — MAGNESIUM: Magnesium: 1.7 mg/dL (ref 1.7–2.4)

## 2023-05-31 LAB — CBC WITH DIFFERENTIAL/PLATELET
Abs Immature Granulocytes: 0.03 10*3/uL (ref 0.00–0.07)
Basophils Absolute: 0.1 10*3/uL (ref 0.0–0.1)
Basophils Relative: 1 %
Eosinophils Absolute: 0.2 10*3/uL (ref 0.0–0.5)
Eosinophils Relative: 3 %
HCT: 30 % — ABNORMAL LOW (ref 36.0–46.0)
Hemoglobin: 9.7 g/dL — ABNORMAL LOW (ref 12.0–15.0)
Immature Granulocytes: 1 %
Lymphocytes Relative: 17 %
Lymphs Abs: 1.1 10*3/uL (ref 0.7–4.0)
MCH: 33.3 pg (ref 26.0–34.0)
MCHC: 32.3 g/dL (ref 30.0–36.0)
MCV: 103.1 fL — ABNORMAL HIGH (ref 80.0–100.0)
Monocytes Absolute: 0.5 10*3/uL (ref 0.1–1.0)
Monocytes Relative: 9 %
Neutro Abs: 4.4 10*3/uL (ref 1.7–7.7)
Neutrophils Relative %: 69 %
Platelets: 250 10*3/uL (ref 150–400)
RBC: 2.91 MIL/uL — ABNORMAL LOW (ref 3.87–5.11)
RDW: 16.8 % — ABNORMAL HIGH (ref 11.5–15.5)
WBC: 6.2 10*3/uL (ref 4.0–10.5)
nRBC: 0 % (ref 0.0–0.2)

## 2023-05-31 LAB — COMPREHENSIVE METABOLIC PANEL
ALT: 32 U/L (ref 0–44)
AST: 28 U/L (ref 15–41)
Albumin: 3.4 g/dL — ABNORMAL LOW (ref 3.5–5.0)
Alkaline Phosphatase: 93 U/L (ref 38–126)
Anion gap: 4 — ABNORMAL LOW (ref 5–15)
BUN: 12 mg/dL (ref 6–20)
CO2: 24 mmol/L (ref 22–32)
Calcium: 8.6 mg/dL — ABNORMAL LOW (ref 8.9–10.3)
Chloride: 107 mmol/L (ref 98–111)
Creatinine, Ser: 0.59 mg/dL (ref 0.44–1.00)
GFR, Estimated: >60 mL/min (ref >60)
Glucose, Bld: 109 mg/dL — ABNORMAL HIGH (ref 70–99)
Potassium: 3.5 mmol/L (ref 3.5–5.1)
Sodium: 135 mmol/L (ref 135–145)
Total Bilirubin: 0.3 mg/dL (ref 0.3–1.2)
Total Protein: 6.4 g/dL — ABNORMAL LOW (ref 6.5–8.1)

## 2023-05-31 MED ORDER — SODIUM CHLORIDE 0.9 % IV SOLN
10.0000 mg | Freq: Once | INTRAVENOUS | Status: AC
  Administered 2023-05-31: 10 mg via INTRAVENOUS
  Filled 2023-05-31: qty 10

## 2023-05-31 MED ORDER — SODIUM CHLORIDE 0.9 % IV SOLN: Freq: Once | INTRAVENOUS | Status: AC

## 2023-05-31 MED ORDER — HEPARIN SOD (PORK) LOCK FLUSH 100 UNIT/ML IV SOLN
500.0000 [IU] | Freq: Once | INTRAVENOUS | Status: AC | PRN
  Administered 2023-05-31: 500 [IU]

## 2023-05-31 MED ORDER — FAMOTIDINE IN NACL 20-0.9 MG/50ML-% IV SOLN
20.0000 mg | Freq: Once | INTRAVENOUS | Status: AC
  Administered 2023-05-31: 20 mg via INTRAVENOUS
  Filled 2023-05-31: qty 50

## 2023-05-31 MED ORDER — SODIUM CHLORIDE 0.9 % IV SOLN
64.0000 mg/m2 | Freq: Once | INTRAVENOUS | Status: AC
  Administered 2023-05-31: 126 mg via INTRAVENOUS
  Filled 2023-05-31: qty 21

## 2023-05-31 MED ORDER — CETIRIZINE HCL 10 MG/ML IV SOLN
10.0000 mg | Freq: Once | INTRAVENOUS | Status: AC
  Administered 2023-05-31: 10 mg via INTRAVENOUS
  Filled 2023-05-31: qty 1

## 2023-05-31 MED ORDER — SODIUM CHLORIDE 0.9% FLUSH
10.0000 mL | INTRAVENOUS | Status: DC | PRN
  Administered 2023-05-31: 10 mL

## 2023-05-31 NOTE — Patient Instructions (Signed)
[facility]  Discharge Instructions: Thank you for choosing [facility] to provide your oncology and hematology care.  If you have a lab appointment with the Cancer Center - please note that after April 8th, 2024, all labs will be drawn in the cancer center.  You do not have to check in or register with the main entrance as you have in the past but will complete your check-in in the cancer center.  Wear comfortable clothing and clothing appropriate for easy access to any Portacath or PICC line.   We strive to give you quality time with your provider. You may need to reschedule your appointment if you arrive late (15 or more minutes).  Arriving late affects you and other patients whose appointments are after yours.  Also, if you miss three or more appointments without notifying the office, you may be dismissed from the clinic at the provider's discretion.      For prescription refill requests, have your pharmacy contact our office and allow 72 hours for refills to be completed.    Today you received the following chemotherapy and/or immunotherapy agents taxol       To help prevent nausea and vomiting after your treatment, we encourage you to take your nausea medication as directed.  BELOW ARE SYMPTOMS THAT SHOULD BE REPORTED IMMEDIATELY: *FEVER GREATER THAN 100.4 F (38 C) OR HIGHER *CHILLS OR SWEATING *NAUSEA AND VOMITING THAT IS NOT CONTROLLED WITH YOUR NAUSEA MEDICATION *UNUSUAL SHORTNESS OF BREATH *UNUSUAL BRUISING OR BLEEDING *URINARY PROBLEMS (pain or burning when urinating, or frequent urination) *BOWEL PROBLEMS (unusual diarrhea, constipation, pain near the anus) TENDERNESS IN MOUTH AND THROAT WITH OR WITHOUT PRESENCE OF ULCERS (sore throat, sores in mouth, or a toothache) UNUSUAL RASH, SWELLING OR PAIN  UNUSUAL VAGINAL DISCHARGE OR ITCHING   Items with * indicate a potential emergency and should be followed up as soon as possible or go to the  Emergency Department if any problems should occur.  Please show the CHEMOTHERAPY ALERT CARD or IMMUNOTHERAPY ALERT CARD at check-in to the Emergency Department and triage nurse.  Should you have questions after your visit or need to cancel or reschedule your appointment, please contact [facility] [phone]  and follow the prompts.  Office hours are 8:00 a.m. to 4:30 p.m. Monday - Friday. Please note that voicemails left after 4:00 p.m. may not be returned until the following business day.  We are closed weekends and major holidays. You have access to a nurse at all times for urgent questions. Please call the main number to the clinic [phone] and follow the prompts.  For any non-urgent questions, you may also contact your provider using MyChart. We now offer e-Visits for anyone 25 and older to request care online for non-urgent symptoms. For details visit [email or web address].   Also download the MyChart app! Go to the app store, search "MyChart", open the app, select [place], and log in with your MyChart username and password.

## 2023-05-31 NOTE — Progress Notes (Signed)

## 2023-05-31 NOTE — Progress Notes (Signed)
Patient has been examined by Dr. Katragadda. Vital signs and labs have been reviewed by MD - ANC, Creatinine, LFTs, hemoglobin, and platelets are within treatment parameters per M.D. - pt may proceed with treatment.  Primary RN and pharmacy notified.  

## 2023-05-31 NOTE — Patient Instructions (Signed)
[facility] at [facility] Discharge Instructions   You were seen and examined today by Dr. Ellin Saba.  He reviewed the results of your lab work which are mostly normal/stable. Your vitamin D is low. Dr. Kirtland Bouchard recommends you take over the counter vitamin D 1000 units daily.   We will proceed with your treatment today.   Return as scheduled.    Thank you for choosing [facility] at [facility] to provide your oncology and hematology care.  To afford each patient quality time with our provider, please arrive at least 15 minutes before your scheduled appointment time.   If you have a lab appointment with the Cancer Center please come in thru the Main Entrance and check in at the main information desk.  You need to re-schedule your appointment should you arrive 10 or more minutes late.  We strive to give you quality time with our providers, and arriving late affects you and other patients whose appointments are after yours.  Also, if you no show three or more times for appointments you may be dismissed from the clinic at the providers discretion.     Again, thank you for choosing [facility].  Our hope is that these requests will decrease the amount of time that you wait before being seen by our physicians.       _____________________________________________________________  Should you have questions after your visit to [facility], please contact our office at [phone] and follow the prompts.  Our office hours are 8:00 a.m. and 4:30 p.m. Monday - Friday.  Please note that voicemails left after 4:00 p.m. may not be returned until the following business day.  We are closed weekends and major holidays.  You do have access to a nurse 24-7, just call the main number to the clinic [phone] and do not press any options, hold on the line and a nurse will answer the phone.    For prescription refill requests, have your pharmacy  contact our office and allow 72 hours.    Due to Covid, you will need to wear a mask upon entering the hospital. If you do not have a mask, a mask will be given to you at the Main Entrance upon arrival. For doctor visits, patients may have 1 support person age 59 or older with them. For treatment visits, patients can not have anyone with them due to social distancing guidelines and our immunocompromised population.

## 2023-06-06 ENCOUNTER — Telehealth: Payer: Self-pay | Admitting: Dietician

## 2023-06-06 ENCOUNTER — Inpatient Hospital Stay: Payer: BC Managed Care – PPO | Attending: Hematology | Admitting: Dietician

## 2023-06-06 DIAGNOSIS — Z170 Estrogen receptor positive status [ER+]: Secondary | ICD-10-CM | POA: Insufficient documentation

## 2023-06-06 DIAGNOSIS — C50412 Malignant neoplasm of upper-outer quadrant of left female breast: Secondary | ICD-10-CM | POA: Insufficient documentation

## 2023-06-06 DIAGNOSIS — E876 Hypokalemia: Secondary | ICD-10-CM | POA: Insufficient documentation

## 2023-06-06 DIAGNOSIS — G629 Polyneuropathy, unspecified: Secondary | ICD-10-CM | POA: Insufficient documentation

## 2023-06-06 DIAGNOSIS — Z801 Family history of malignant neoplasm of trachea, bronchus and lung: Secondary | ICD-10-CM | POA: Insufficient documentation

## 2023-06-06 DIAGNOSIS — Z5111 Encounter for antineoplastic chemotherapy: Secondary | ICD-10-CM | POA: Insufficient documentation

## 2023-06-06 DIAGNOSIS — R0602 Shortness of breath: Secondary | ICD-10-CM | POA: Insufficient documentation

## 2023-06-06 DIAGNOSIS — G2581 Restless legs syndrome: Secondary | ICD-10-CM | POA: Insufficient documentation

## 2023-06-06 DIAGNOSIS — Z7901 Long term (current) use of anticoagulants: Secondary | ICD-10-CM | POA: Insufficient documentation

## 2023-06-06 DIAGNOSIS — G479 Sleep disorder, unspecified: Secondary | ICD-10-CM | POA: Insufficient documentation

## 2023-06-06 DIAGNOSIS — Z803 Family history of malignant neoplasm of breast: Secondary | ICD-10-CM | POA: Insufficient documentation

## 2023-06-06 DIAGNOSIS — Z86711 Personal history of pulmonary embolism: Secondary | ICD-10-CM | POA: Insufficient documentation

## 2023-06-06 DIAGNOSIS — Z8051 Family history of malignant neoplasm of kidney: Secondary | ICD-10-CM | POA: Insufficient documentation

## 2023-06-06 DIAGNOSIS — Z8042 Family history of malignant neoplasm of prostate: Secondary | ICD-10-CM | POA: Insufficient documentation

## 2023-06-06 DIAGNOSIS — Z79899 Other long term (current) drug therapy: Secondary | ICD-10-CM | POA: Insufficient documentation

## 2023-06-06 NOTE — Telephone Encounter (Signed)
Nutrition Follow-up:  Patient with left breast cancer. She is currently receiving reduce dose paclitaxel   Spoke with patient via telephone. She reports improved appetite. Foods are tasting better and eating a variety of foods. Patient limits intake of breads as these can be challenging to swallow. Patient having regular bowel movements. Taking stool softener daily. Patient reports singe episode of nausea without vomiting last night. This has resolved. Patient concerned about rapid wt increase. Pt receiving weekly decadron 10 mg prior to treatment.    Medications: reviewed   Labs: reviewed   Anthropometrics: Last wt 196 lb 6.4 oz on 7/30 - trending up  7/23 - 196 lb 6.4 oz 7/9 - 192 lb 6.4 oz  6/25 - 187 lb 12.8 oz   NUTRITION DIAGNOSIS: Food and nutrition related knowledge deficit improving    INTERVENTION:  Continue eating a variety of foods with good sources of lean proteins Encouraged activity as able  Educated on plant-based diet - will email handout per pt request    MONITORING, EVALUATION, GOAL: wt trends, intake   NEXT VISIT: To be scheduled as needed. Pt encouraged to contact with nutrition questions/concerns

## 2023-06-07 ENCOUNTER — Inpatient Hospital Stay: Payer: BC Managed Care – PPO

## 2023-06-07 ENCOUNTER — Inpatient Hospital Stay: Payer: BC Managed Care – PPO | Admitting: Hematology

## 2023-06-07 VITALS — BP 117/75 | HR 77 | Temp 97.8°F | Resp 18

## 2023-06-07 DIAGNOSIS — G629 Polyneuropathy, unspecified: Secondary | ICD-10-CM | POA: Diagnosis not present

## 2023-06-07 DIAGNOSIS — E876 Hypokalemia: Secondary | ICD-10-CM | POA: Diagnosis not present

## 2023-06-07 DIAGNOSIS — Z95828 Presence of other vascular implants and grafts: Secondary | ICD-10-CM

## 2023-06-07 DIAGNOSIS — Z801 Family history of malignant neoplasm of trachea, bronchus and lung: Secondary | ICD-10-CM | POA: Diagnosis not present

## 2023-06-07 DIAGNOSIS — R0602 Shortness of breath: Secondary | ICD-10-CM | POA: Diagnosis not present

## 2023-06-07 DIAGNOSIS — Z8042 Family history of malignant neoplasm of prostate: Secondary | ICD-10-CM | POA: Diagnosis not present

## 2023-06-07 DIAGNOSIS — Z803 Family history of malignant neoplasm of breast: Secondary | ICD-10-CM | POA: Diagnosis not present

## 2023-06-07 DIAGNOSIS — Z7901 Long term (current) use of anticoagulants: Secondary | ICD-10-CM | POA: Diagnosis not present

## 2023-06-07 DIAGNOSIS — G479 Sleep disorder, unspecified: Secondary | ICD-10-CM | POA: Diagnosis not present

## 2023-06-07 DIAGNOSIS — C50412 Malignant neoplasm of upper-outer quadrant of left female breast: Secondary | ICD-10-CM | POA: Diagnosis present

## 2023-06-07 DIAGNOSIS — G2581 Restless legs syndrome: Secondary | ICD-10-CM | POA: Diagnosis not present

## 2023-06-07 DIAGNOSIS — Z79899 Other long term (current) drug therapy: Secondary | ICD-10-CM | POA: Diagnosis not present

## 2023-06-07 DIAGNOSIS — Z170 Estrogen receptor positive status [ER+]: Secondary | ICD-10-CM | POA: Diagnosis present

## 2023-06-07 DIAGNOSIS — Z8051 Family history of malignant neoplasm of kidney: Secondary | ICD-10-CM | POA: Diagnosis not present

## 2023-06-07 DIAGNOSIS — Z86711 Personal history of pulmonary embolism: Secondary | ICD-10-CM | POA: Diagnosis not present

## 2023-06-07 DIAGNOSIS — Z5111 Encounter for antineoplastic chemotherapy: Secondary | ICD-10-CM | POA: Diagnosis not present

## 2023-06-07 LAB — CBC WITH DIFFERENTIAL/PLATELET
Abs Immature Granulocytes: 0.02 10*3/uL (ref 0.00–0.07)
Basophils Absolute: 0.1 10*3/uL (ref 0.0–0.1)
Basophils Relative: 1 %
Eosinophils Absolute: 0.1 10*3/uL (ref 0.0–0.5)
Eosinophils Relative: 3 %
HCT: 29.9 % — ABNORMAL LOW (ref 36.0–46.0)
Hemoglobin: 9.7 g/dL — ABNORMAL LOW (ref 12.0–15.0)
Immature Granulocytes: 0 %
Lymphocytes Relative: 24 %
Lymphs Abs: 1.2 10*3/uL (ref 0.7–4.0)
MCH: 33.2 pg (ref 26.0–34.0)
MCHC: 32.4 g/dL (ref 30.0–36.0)
MCV: 102.4 fL — ABNORMAL HIGH (ref 80.0–100.0)
Monocytes Absolute: 0.5 10*3/uL (ref 0.1–1.0)
Monocytes Relative: 11 %
Neutro Abs: 3.1 10*3/uL (ref 1.7–7.7)
Neutrophils Relative %: 61 %
Platelets: 239 10*3/uL (ref 150–400)
RBC: 2.92 MIL/uL — ABNORMAL LOW (ref 3.87–5.11)
RDW: 15.2 % (ref 11.5–15.5)
WBC: 5.1 10*3/uL (ref 4.0–10.5)
nRBC: 0 % (ref 0.0–0.2)

## 2023-06-07 LAB — COMPREHENSIVE METABOLIC PANEL
ALT: 36 U/L (ref 0–44)
AST: 30 U/L (ref 15–41)
Albumin: 3.4 g/dL — ABNORMAL LOW (ref 3.5–5.0)
Alkaline Phosphatase: 92 U/L (ref 38–126)
Anion gap: 10 (ref 5–15)
BUN: 9 mg/dL (ref 6–20)
CO2: 23 mmol/L (ref 22–32)
Calcium: 8.7 mg/dL — ABNORMAL LOW (ref 8.9–10.3)
Chloride: 105 mmol/L (ref 98–111)
Creatinine, Ser: 0.5 mg/dL (ref 0.44–1.00)
GFR, Estimated: >60 mL/min (ref >60)
Glucose, Bld: 92 mg/dL (ref 70–99)
Potassium: 3.5 mmol/L (ref 3.5–5.1)
Sodium: 138 mmol/L (ref 135–145)
Total Bilirubin: 0.4 mg/dL (ref 0.3–1.2)
Total Protein: 6.5 g/dL (ref 6.5–8.1)

## 2023-06-07 LAB — MAGNESIUM: Magnesium: 1.7 mg/dL (ref 1.7–2.4)

## 2023-06-07 MED ORDER — SODIUM CHLORIDE 0.9% FLUSH
10.0000 mL | Freq: Once | INTRAVENOUS | Status: AC
  Administered 2023-06-07: 10 mL via INTRAVENOUS

## 2023-06-07 MED ORDER — SODIUM CHLORIDE 0.9 % IV SOLN
64.0000 mg/m2 | Freq: Once | INTRAVENOUS | Status: AC
  Administered 2023-06-07: 126 mg via INTRAVENOUS
  Filled 2023-06-07: qty 21

## 2023-06-07 MED ORDER — HEPARIN SOD (PORK) LOCK FLUSH 100 UNIT/ML IV SOLN
500.0000 [IU] | Freq: Once | INTRAVENOUS | Status: AC | PRN
  Administered 2023-06-07: 500 [IU]

## 2023-06-07 MED ORDER — SODIUM CHLORIDE 0.9 % IV SOLN
10.0000 mg | Freq: Once | INTRAVENOUS | Status: AC
  Administered 2023-06-07: 10 mg via INTRAVENOUS
  Filled 2023-06-07: qty 10

## 2023-06-07 MED ORDER — SODIUM CHLORIDE 0.9 % IV SOLN: Freq: Once | INTRAVENOUS | Status: AC

## 2023-06-07 MED ORDER — SODIUM CHLORIDE 0.9% FLUSH
10.0000 mL | INTRAVENOUS | Status: DC | PRN
  Administered 2023-06-07: 10 mL

## 2023-06-07 MED ORDER — CETIRIZINE HCL 10 MG/ML IV SOLN
10.0000 mg | Freq: Once | INTRAVENOUS | Status: AC
  Administered 2023-06-07: 10 mg via INTRAVENOUS
  Filled 2023-06-07: qty 1

## 2023-06-07 MED ORDER — FAMOTIDINE IN NACL 20-0.9 MG/50ML-% IV SOLN
20.0000 mg | Freq: Once | INTRAVENOUS | Status: AC
  Administered 2023-06-07: 20 mg via INTRAVENOUS
  Filled 2023-06-07: qty 50

## 2023-06-07 NOTE — Progress Notes (Signed)
Patient presents today for Paclitaxel  infusion.  Patient is in satisfactory condition with no new complaints voiced.  Vital signs are stable.  Labs reviewed and all labs are within treatment parameters.  We will proceed with treatment per MD orders.    Treatment given today per MD orders. Tolerated infusion without adverse affects. Vital signs stable. No complaints at this time. Discharged from clinic ambulatory in stable condition. Alert and oriented x 3. F/U with [facility] as scheduled.

## 2023-06-07 NOTE — Progress Notes (Signed)
Patients port flushed without difficulty.  Good blood return noted with no bruising or swelling noted at site.  Stable during access and blood draw.  Patient to remain accessed for treatment. 

## 2023-06-07 NOTE — Patient Instructions (Signed)
[facility]  Discharge Instructions: Thank you for choosing [facility] to provide your oncology and hematology care.  If you have a lab appointment with the Cancer Center - please note that after April 8th, 2024, all labs will be drawn in the cancer center.  You do not have to check in or register with the main entrance as you have in the past but will complete your check-in in the cancer center.  Wear comfortable clothing and clothing appropriate for easy access to any Portacath or PICC line.   We strive to give you quality time with your provider. You may need to reschedule your appointment if you arrive late (15 or more minutes).  Arriving late affects you and other patients whose appointments are after yours.  Also, if you miss three or more appointments without notifying the office, you may be dismissed from the clinic at the provider's discretion.      For prescription refill requests, have your pharmacy contact our office and allow 72 hours for refills to be completed.    Today you received the following chemotherapy and/or immunotherapy agents Paclitaxel     To help prevent nausea and vomiting after your treatment, we encourage you to take your nausea medication as directed.  Paclitaxel Injection What is this medication? PACLITAXEL (PAK li TAX el) treats some types of cancer. It works by slowing down the growth of cancer cells. This medicine may be used for other purposes; ask your health care provider or pharmacist if you have questions. COMMON BRAND NAME(S): Onxol, Taxol What should I tell my care team before I take this medication? They need to know if you have any of these conditions: Heart disease Liver disease Low white blood cell levels An unusual or allergic reaction to paclitaxel, other medications, foods, dyes, or preservatives If you or your partner are pregnant or trying to get pregnant Breast-feeding How should I use this  medication? This medication is injected into a vein. It is given by your care team in a hospital or clinic setting. Talk to your care team about the use of this medication in children. While it may be given to children for selected conditions, precautions do apply. Overdosage: If you think you have taken too much of this medicine contact a poison control center or emergency room at once. NOTE: This medicine is only for you. Do not share this medicine with others. What if I miss a dose? Keep appointments for follow-up doses. It is important not to miss your dose. Call your care team if you are unable to keep an appointment. What may interact with this medication? Do not take this medication with any of the following: Live virus vaccines Other medications may affect the way this medication works. Talk with your care team about all of the medications you take. They may suggest changes to your treatment plan to lower the risk of side effects and to make sure your medications work as intended. This list may not describe all possible interactions. Give your health care provider a list of all the medicines, herbs, non-prescription drugs, or dietary supplements you use. Also tell them if you smoke, drink alcohol, or use illegal drugs. Some items may interact with your medicine. What should I watch for while using this medication? Your condition will be monitored carefully while you are receiving this medication. You may need blood work while taking this medication. This medication may make you feel generally unwell. This is not  uncommon as chemotherapy can affect healthy cells as well as cancer cells. Report any side effects. Continue your course of treatment even though you feel ill unless your care team tells you to stop. This medication can cause serious allergic reactions. To reduce the risk, your care team may give you other medications to take before receiving this one. Be sure to follow the directions  from your care team. This medication may increase your risk of getting an infection. Call your care team for advice if you get a fever, chills, sore throat, or other symptoms of a cold or flu. Do not treat yourself. Try to avoid being around people who are sick. This medication may increase your risk to bruise or bleed. Call your care team if you notice any unusual bleeding. Be careful brushing or flossing your teeth or using a toothpick because you may get an infection or bleed more easily. If you have any dental work done, tell your dentist you are receiving this medication. Talk to your care team if you may be pregnant. Serious birth defects can occur if you take this medication during pregnancy. Talk to your care team before breastfeeding. Changes to your treatment plan may be needed. What side effects may I notice from receiving this medication? Side effects that you should report to your care team as soon as possible: Allergic reactions--skin rash, itching, hives, swelling of the face, lips, tongue, or throat Heart rhythm changes--fast or irregular heartbeat, dizziness, feeling faint or lightheaded, chest pain, trouble breathing Increase in blood pressure Infection--fever, chills, cough, sore throat, wounds that don't heal, pain or trouble when passing urine, general feeling of discomfort or being unwell Low blood pressure--dizziness, feeling faint or lightheaded, blurry vision Low red blood cell level--unusual weakness or fatigue, dizziness, headache, trouble breathing Painful swelling, warmth, or redness of the skin, blisters or sores at the infusion site Pain, tingling, or numbness in the hands or feet Slow heartbeat--dizziness, feeling faint or lightheaded, confusion, trouble breathing, unusual weakness or fatigue Unusual bruising or bleeding Side effects that usually do not require medical attention (report to your care team if they continue or are bothersome): Diarrhea Hair  loss Joint pain Loss of appetite Muscle pain Nausea Vomiting This list may not describe all possible side effects. Call your doctor for medical advice about side effects. You may report side effects to FDA at 1-800-FDA-1088. Where should I keep my medication? This medication is given in a hospital or clinic. It will not be stored at home. NOTE: This sheet is a summary. It may not cover all possible information. If you have questions about this medicine, talk to your doctor, pharmacist, or health care provider.  2024 Elsevier/Gold Standard (2022-03-09 00:00:00)   BELOW ARE SYMPTOMS THAT SHOULD BE REPORTED IMMEDIATELY: *FEVER GREATER THAN 100.4 F (38 C) OR HIGHER *CHILLS OR SWEATING *NAUSEA AND VOMITING THAT IS NOT CONTROLLED WITH YOUR NAUSEA MEDICATION *UNUSUAL SHORTNESS OF BREATH *UNUSUAL BRUISING OR BLEEDING *URINARY PROBLEMS (pain or burning when urinating, or frequent urination) *BOWEL PROBLEMS (unusual diarrhea, constipation, pain near the anus) TENDERNESS IN MOUTH AND THROAT WITH OR WITHOUT PRESENCE OF ULCERS (sore throat, sores in mouth, or a toothache) UNUSUAL RASH, SWELLING OR PAIN  UNUSUAL VAGINAL DISCHARGE OR ITCHING   Items with * indicate a potential emergency and should be followed up as soon as possible or go to the Emergency Department if any problems should occur.  Please show the CHEMOTHERAPY ALERT CARD or IMMUNOTHERAPY ALERT CARD at check-in to the Emergency  Department and triage nurse.  Should you have questions after your visit or need to cancel or reschedule your appointment, please contact [facility] [phone]  and follow the prompts.  Office hours are 8:00 a.m. to 4:30 p.m. Monday - Friday. Please note that voicemails left after 4:00 p.m. may not be returned until the following business day.  We are closed weekends and major holidays. You have access to a nurse at all times for urgent questions. Please call the main number to the clinic  [phone] and follow the prompts.  For any non-urgent questions, you may also contact your provider using MyChart. We now offer e-Visits for anyone 60 and older to request care online for non-urgent symptoms. For details visit [email or web address].   Also download the MyChart app! Go to the app store, search "MyChart", open the app, select [place], and log in with your MyChart username and password.

## 2023-06-11 ENCOUNTER — Other Ambulatory Visit: Payer: Self-pay

## 2023-06-14 ENCOUNTER — Inpatient Hospital Stay: Payer: BC Managed Care – PPO

## 2023-06-14 ENCOUNTER — Inpatient Hospital Stay: Payer: BC Managed Care – PPO | Admitting: Hematology

## 2023-06-14 VITALS — BP 128/74 | HR 73 | Temp 98.1°F | Resp 18

## 2023-06-14 DIAGNOSIS — Z95828 Presence of other vascular implants and grafts: Secondary | ICD-10-CM

## 2023-06-14 DIAGNOSIS — Z5111 Encounter for antineoplastic chemotherapy: Secondary | ICD-10-CM | POA: Diagnosis not present

## 2023-06-14 DIAGNOSIS — Z170 Estrogen receptor positive status [ER+]: Secondary | ICD-10-CM

## 2023-06-14 LAB — COMPREHENSIVE METABOLIC PANEL
ALT: 33 U/L (ref 0–44)
AST: 28 U/L (ref 15–41)
Albumin: 3.5 g/dL (ref 3.5–5.0)
Alkaline Phosphatase: 92 U/L (ref 38–126)
Anion gap: 8 (ref 5–15)
BUN: 9 mg/dL (ref 6–20)
CO2: 25 mmol/L (ref 22–32)
Calcium: 8.7 mg/dL — ABNORMAL LOW (ref 8.9–10.3)
Chloride: 104 mmol/L (ref 98–111)
Creatinine, Ser: 0.52 mg/dL (ref 0.44–1.00)
GFR, Estimated: >60 mL/min (ref >60)
Glucose, Bld: 88 mg/dL (ref 70–99)
Potassium: 3.6 mmol/L (ref 3.5–5.1)
Sodium: 137 mmol/L (ref 135–145)
Total Bilirubin: 0.3 mg/dL (ref 0.3–1.2)
Total Protein: 6.5 g/dL (ref 6.5–8.1)

## 2023-06-14 LAB — CBC WITH DIFFERENTIAL/PLATELET
Abs Immature Granulocytes: 0.02 10*3/uL (ref 0.00–0.07)
Basophils Absolute: 0.1 10*3/uL (ref 0.0–0.1)
Basophils Relative: 1 %
Eosinophils Absolute: 0.1 10*3/uL (ref 0.0–0.5)
Eosinophils Relative: 2 %
HCT: 32.5 % — ABNORMAL LOW (ref 36.0–46.0)
Hemoglobin: 10.7 g/dL — ABNORMAL LOW (ref 12.0–15.0)
Immature Granulocytes: 0 %
Lymphocytes Relative: 19 %
Lymphs Abs: 1.1 10*3/uL (ref 0.7–4.0)
MCH: 33.6 pg (ref 26.0–34.0)
MCHC: 32.9 g/dL (ref 30.0–36.0)
MCV: 102.2 fL — ABNORMAL HIGH (ref 80.0–100.0)
Monocytes Absolute: 0.6 10*3/uL (ref 0.1–1.0)
Monocytes Relative: 10 %
Neutro Abs: 3.8 10*3/uL (ref 1.7–7.7)
Neutrophils Relative %: 68 %
Platelets: 265 10*3/uL (ref 150–400)
RBC: 3.18 MIL/uL — ABNORMAL LOW (ref 3.87–5.11)
RDW: 14.5 % (ref 11.5–15.5)
WBC: 5.7 10*3/uL (ref 4.0–10.5)
nRBC: 0 % (ref 0.0–0.2)

## 2023-06-14 LAB — MAGNESIUM: Magnesium: 1.7 mg/dL (ref 1.7–2.4)

## 2023-06-14 MED ORDER — SODIUM CHLORIDE 0.9 % IV SOLN
64.0000 mg/m2 | Freq: Once | INTRAVENOUS | Status: AC
  Administered 2023-06-14: 126 mg via INTRAVENOUS
  Filled 2023-06-14: qty 21

## 2023-06-14 MED ORDER — CETIRIZINE HCL 10 MG/ML IV SOLN
10.0000 mg | Freq: Once | INTRAVENOUS | Status: AC
  Administered 2023-06-14: 10 mg via INTRAVENOUS
  Filled 2023-06-14: qty 1

## 2023-06-14 MED ORDER — SODIUM CHLORIDE 0.9 % IV SOLN: Freq: Once | INTRAVENOUS | Status: AC

## 2023-06-14 MED ORDER — FAMOTIDINE IN NACL 20-0.9 MG/50ML-% IV SOLN
20.0000 mg | Freq: Once | INTRAVENOUS | Status: AC
  Administered 2023-06-14: 20 mg via INTRAVENOUS
  Filled 2023-06-14: qty 50

## 2023-06-14 MED ORDER — HEPARIN SOD (PORK) LOCK FLUSH 100 UNIT/ML IV SOLN
500.0000 [IU] | Freq: Once | INTRAVENOUS | Status: AC | PRN
  Administered 2023-06-14: 500 [IU]

## 2023-06-14 MED ORDER — SODIUM CHLORIDE 0.9 % IV SOLN
10.0000 mg | Freq: Once | INTRAVENOUS | Status: AC
  Administered 2023-06-14: 10 mg via INTRAVENOUS
  Filled 2023-06-14: qty 10

## 2023-06-14 MED ORDER — SODIUM CHLORIDE 0.9% FLUSH
10.0000 mL | Freq: Once | INTRAVENOUS | Status: AC
  Administered 2023-06-14: 10 mL via INTRAVENOUS

## 2023-06-14 MED ORDER — SODIUM CHLORIDE 0.9% FLUSH
10.0000 mL | INTRAVENOUS | Status: DC | PRN
  Administered 2023-06-14: 10 mL

## 2023-06-14 NOTE — Progress Notes (Signed)
Patient presents today for chemotherapy infusion.  Patient is in satisfactory condition with no new complaints voiced.  Vital signs are stable.  Labs reviewed and all labs are within treatment parameters.  We will proceed with treatment per MD orders.    Patient tolerated treatment well with no complaints voiced.  Patient left ambulatory with son in stable condition.  Vital signs stable at discharge.  Follow up as scheduled.

## 2023-06-14 NOTE — Patient Instructions (Signed)
[facility]  Discharge Instructions: Thank you for choosing [facility] to provide your oncology and hematology care.  If you have a lab appointment with the Cancer Center - please note that after April 8th, 2024, all labs will be drawn in the cancer center.  You do not have to check in or register with the main entrance as you have in the past but will complete your check-in in the cancer center.  Wear comfortable clothing and clothing appropriate for easy access to any Portacath or PICC line.   We strive to give you quality time with your provider. You may need to reschedule your appointment if you arrive late (15 or more minutes).  Arriving late affects you and other patients whose appointments are after yours.  Also, if you miss three or more appointments without notifying the office, you may be dismissed from the clinic at the provider's discretion.      For prescription refill requests, have your pharmacy contact our office and allow 72 hours for refills to be completed.    Today you received the following chemotherapy and/or immunotherapy agents Taxol. Paclitaxel Injection What is this medication? PACLITAXEL (PAK li TAX el) treats some types of cancer. It works by slowing down the growth of cancer cells. This medicine may be used for other purposes; ask your health care provider or pharmacist if you have questions. COMMON BRAND NAME(S): Onxol, Taxol What should I tell my care team before I take this medication? They need to know if you have any of these conditions: Heart disease Liver disease Low white blood cell levels An unusual or allergic reaction to paclitaxel, other medications, foods, dyes, or preservatives If you or your partner are pregnant or trying to get pregnant Breast-feeding How should I use this medication? This medication is injected into a vein. It is given by your care team in a hospital or clinic setting. Talk to your care team  about the use of this medication in children. While it may be given to children for selected conditions, precautions do apply. Overdosage: If you think you have taken too much of this medicine contact a poison control center or emergency room at once. NOTE: This medicine is only for you. Do not share this medicine with others. What if I miss a dose? Keep appointments for follow-up doses. It is important not to miss your dose. Call your care team if you are unable to keep an appointment. What may interact with this medication? Do not take this medication with any of the following: Live virus vaccines Other medications may affect the way this medication works. Talk with your care team about all of the medications you take. They may suggest changes to your treatment plan to lower the risk of side effects and to make sure your medications work as intended. This list may not describe all possible interactions. Give your health care provider a list of all the medicines, herbs, non-prescription drugs, or dietary supplements you use. Also tell them if you smoke, drink alcohol, or use illegal drugs. Some items may interact with your medicine. What should I watch for while using this medication? Your condition will be monitored carefully while you are receiving this medication. You may need blood work while taking this medication. This medication may make you feel generally unwell. This is not uncommon as chemotherapy can affect healthy cells as well as cancer cells. Report any side effects. Continue your course of treatment even though you  feel ill unless your care team tells you to stop. This medication can cause serious allergic reactions. To reduce the risk, your care team may give you other medications to take before receiving this one. Be sure to follow the directions from your care team. This medication may increase your risk of getting an infection. Call your care team for advice if you get a fever,  chills, sore throat, or other symptoms of a cold or flu. Do not treat yourself. Try to avoid being around people who are sick. This medication may increase your risk to bruise or bleed. Call your care team if you notice any unusual bleeding. Be careful brushing or flossing your teeth or using a toothpick because you may get an infection or bleed more easily. If you have any dental work done, tell your dentist you are receiving this medication. Talk to your care team if you may be pregnant. Serious birth defects can occur if you take this medication during pregnancy. Talk to your care team before breastfeeding. Changes to your treatment plan may be needed. What side effects may I notice from receiving this medication? Side effects that you should report to your care team as soon as possible: Allergic reactions--skin rash, itching, hives, swelling of the face, lips, tongue, or throat Heart rhythm changes--fast or irregular heartbeat, dizziness, feeling faint or lightheaded, chest pain, trouble breathing Increase in blood pressure Infection--fever, chills, cough, sore throat, wounds that don't heal, pain or trouble when passing urine, general feeling of discomfort or being unwell Low blood pressure--dizziness, feeling faint or lightheaded, blurry vision Low red blood cell level--unusual weakness or fatigue, dizziness, headache, trouble breathing Painful swelling, warmth, or redness of the skin, blisters or sores at the infusion site Pain, tingling, or numbness in the hands or feet Slow heartbeat--dizziness, feeling faint or lightheaded, confusion, trouble breathing, unusual weakness or fatigue Unusual bruising or bleeding Side effects that usually do not require medical attention (report to your care team if they continue or are bothersome): Diarrhea Hair loss Joint pain Loss of appetite Muscle pain Nausea Vomiting This list may not describe all possible side effects. Call your doctor for  medical advice about side effects. You may report side effects to FDA at 1-800-FDA-1088. Where should I keep my medication? This medication is given in a hospital or clinic. It will not be stored at home. NOTE: This sheet is a summary. It may not cover all possible information. If you have questions about this medicine, talk to your doctor, pharmacist, or health care provider.  2024 Elsevier/Gold Standard (2022-03-09 00:00:00)       To help prevent nausea and vomiting after your treatment, we encourage you to take your nausea medication as directed.  BELOW ARE SYMPTOMS THAT SHOULD BE REPORTED IMMEDIATELY: *FEVER GREATER THAN 100.4 F (38 C) OR HIGHER *CHILLS OR SWEATING *NAUSEA AND VOMITING THAT IS NOT CONTROLLED WITH YOUR NAUSEA MEDICATION *UNUSUAL SHORTNESS OF BREATH *UNUSUAL BRUISING OR BLEEDING *URINARY PROBLEMS (pain or burning when urinating, or frequent urination) *BOWEL PROBLEMS (unusual diarrhea, constipation, pain near the anus) TENDERNESS IN MOUTH AND THROAT WITH OR WITHOUT PRESENCE OF ULCERS (sore throat, sores in mouth, or a toothache) UNUSUAL RASH, SWELLING OR PAIN  UNUSUAL VAGINAL DISCHARGE OR ITCHING   Items with * indicate a potential emergency and should be followed up as soon as possible or go to the Emergency Department if any problems should occur.  Please show the CHEMOTHERAPY ALERT CARD or IMMUNOTHERAPY ALERT CARD at check-in to the Emergency  Department and triage nurse.  Should you have questions after your visit or need to cancel or reschedule your appointment, please contact [facility] [phone]  and follow the prompts.  Office hours are 8:00 a.m. to 4:30 p.m. Monday - Friday. Please note that voicemails left after 4:00 p.m. may not be returned until the following business day.  We are closed weekends and major holidays. You have access to a nurse at all times for urgent questions. Please call the main number to the clinic [phone] and  follow the prompts.  For any non-urgent questions, you may also contact your provider using MyChart. We now offer e-Visits for anyone 51 and older to request care online for non-urgent symptoms. For details visit [email or web address].   Also download the MyChart app! Go to the app store, search "MyChart", open the app, select [place], and log in with your MyChart username and password.

## 2023-06-20 NOTE — Progress Notes (Signed)
[facility] [address] [place] [address]    Clinic Day:  06/21/23    Referring physician: [name], PA-C  Patient Care Team: [name], PA-C as PCP - General (Physician Assistant) [name], MD as Medical Oncologist (Medical Oncology)   ASSESSMENT & PLAN:   Assessment: 1.  Stage I (T1c N1 M0, G2, ER/PR+, HER2-) left breast UOQ IDC: - Screening mammogram (12/01/2022): Possible distortion in the left breast.  Right breast has no findings suspicious for malignancy. - Left breast diagnostic mammogram/US (12/14/2022): Irregular hypoechoic mass at the 1 o'clock position of the left breast, 4 cm from nipple, measuring 1 x 0.7 x 0.9 cm.  No pathologic lymphadenopathy in the left axilla. - Left breast biopsy (12/16/2022): Invasive ductal carcinoma, grade 2, ER/PR 100%, Ki-67 1%, HER2 0 by IHC. - Left breast lumpectomy and SLNB on 01/10/2023. - Pathology: 1.4 x 1.1 x 1.1 cm moderately differentiated IDC, grade 2, margins negative.  1 sentinel lymph node with metastatic carcinoma (1.1 mm).  1 lymph node with micrometastasis (1 mm).  3 other sentinel lymph nodes negative for invasive carcinoma.  PT1 cpN1a. - Oncotype DX recurrence score 14. - CT CAP (02/08/2023): Left Staebler 1.4 cm sclerotic lesion indeterminate.  No evidence of metastatic disease.  Right nephrolithiasis. - PET scan (02/10/2023): Postop changes in the left breast and axilla.  No uptake in the left established lesion. - 2D echo (02/16/2023): LVEF 55 to 60%. - Cycle 1 AC on 02/22/2023   2.  Social/family history: - She lives at home with her husband.  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  No prior history of smoking. - Paternal grandmother had breast cancer.  Brother had kidney cancer.  Father had prostate cancer.   3.  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  Probable infarct  in the left lung base. - Heterozygosity for factor V Leiden mutation.  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  PT mutation negative.  Protein C, protein S, AT III normal. - She has been on Xarelto since then.   4.  Bone health: - DEXA scan (01/04/2023): T-score -0.3, normal.    Plan: 1.  Stage IB (T1c N1 M0, G2, ER/PR+, HER2-) left breast UOQ IDC: - Neuropathy has been stable since dose reduction. - Reviewed labs today: Normal LFTs and creatinine.  CBC grossly normal. - Proceed with week 10 treatment today with 20% dose reduction of paclitaxel. - RTC 2 weeks for follow-up. - We discussed referral to radiation oncology.   2.  Peripheral neuropathy: - Continue gabapentin 300 mg at bedtime. - She has some soreness in the fingertips and occasional difficulty opening bottle caps.  Rarely drops small objects.   3.  Restless leg syndrome: - Current continue Requip 2 mg half tablet in the morning and whole tablet in the evening.   4.  Hypokalemia: - Continue potassium 20 mEq daily.  Potassium is normal.       No orders of the defined types were placed in this encounter.      Alben Deeds Teague,acting as a Neurosurgeon for [name], MD.,have documented all relevant documentation on the behalf of [name], MD,as directed by  [name], MD while in the presence of [name], MD.  I, [name] MD, have reviewed the above documentation for accuracy and completeness, and I agree with the above.     [name], MD  8/20/20245:55 PM  CHIEF COMPLAINT:   Diagnosis: left breast cancer    Cancer Staging  Breast cancer of upper-outer quadrant of left female breast [facility]) Staging form: Breast, AJCC 8th Edition - Clinical stage from 12/23/2022: Stage IB (cT1c, cN1(sn), cM0, G2, ER+, PR+, HER2-) - Unsigned    Prior Therapy: 1. Lumpectomy and SLNB, 01/10/23  2. Dose dense AC, 4 cycles, 02/22/23 - 04/05/23  Current Therapy:  weekly paclitaxel     HISTORY OF PRESENT ILLNESS:   Oncology History  Breast cancer of upper-outer quadrant of left female breast (HCC)  12/23/2022 Initial Diagnosis   Breast cancer of upper-outer quadrant of left female breast   02/22/2023 -  Chemotherapy   Patient is on Treatment Plan : BREAST ADJUVANT DOSE DENSE AC q14d / PACLitaxel q7d        INTERVAL HISTORY:   Tryphena is a 52 y.o. female presenting to clinic today for follow up of left breast cancer. She was last seen by me on 05/31/23.  Today, she states that she is doing well overall. Her appetite level is at 100%. Her energy level is at 50%.  She notes Gabapentin 300 mg once at night has not improved or worsened symptom of soreness in the fingertips. She still has difficulty opening and holding things, including putting on necklaces. She also notes pain when pressure is put on the fingertips. She has not been seen by radiation. She is taking Potassium as prescribed.She would like to know if she still has to take OTC Vitamin D. She is taking OTC Zzzquil after being given steroids.  PAST MEDICAL HISTORY:   Past Medical History: Past Medical History:  Diagnosis Date   Anxiety    Chronic headaches    Depression    Factor V Leiden mutation (HCC)    Fatigue 02/18/2015   History of kidney stones    Hypothyroidism    MRSA (methicillin resistant Staphylococcus aureus)    Pain of tooth socket 03/14/2015   Pancreatitis    PONV (postoperative nausea and vomiting)    Port-A-Cath in place 02/14/2023   Pulmonary embolus (HCC)    Restless leg syndrome    Shingles rash 03/14/2015   Shoulder injury    Stress    family   Weight gain 02/18/2015    Surgical History: Past Surgical History:  Procedure Laterality Date   BREAST BIOPSY Left 12/16/2022   [place] LT BREAST BX W LOC DEV 1ST LESION IMG BX SPEC US GUIDE 12/16/2022 [facility]   BREAST BIOPSY Left 01/04/2023   [place] LT RADIO FREQUENCY TAG LOC US GUIDE 01/04/2023 Edwin Cap, MD [facility]    COLONOSCOPY  03/25/2004   WUJ:WJXB canal hemorrhoids, otherwise normal rectum,   DILITATION & CURRETTAGE/HYSTROSCOPY WITH NOVASURE ABLATION N/A 11/08/2017   Procedure: DILATATION & CURETTAGE/HYSTEROSCOPY WITH NOVASURE ENDOMETRIAL ABLATION;  Surgeon: Tilda Burrow, MD;  Location: [facility];  Service: Gynecology;  Laterality: N/A;   ESOPHAGOGASTRODUODENOSCOPY  03/25/2004   JYN:WGNFAO esophagus, small hiatal hernia, otherwise normal stomach   kidney stones  05/2014,11/2014   LAPAROSCOPIC BILATERAL SALPINGECTOMY Bilateral 11/08/2017   Procedure: LAPAROSCOPIC BILATERAL SALPINGECTOMY;  Surgeon: Tilda Burrow, MD;  Location: [facility];  Service: Gynecology;  Laterality: Bilateral;   LITHOTRIPSY  2008   PARTIAL MASTECTOMY WITH AXILLARY SENTINEL LYMPH NODE BIOPSY Left 01/10/2023   Procedure: PARTIAL MASTECTOMY WITH AXILLARY SENTINEL LYMPH NODE BIOPSY AND RADIOFREQUENCY TAG PLACEMENT;  Surgeon: Lucretia Roers, MD;  Location: [facility];  Service: General;  Laterality: Left;   PORTACATH PLACEMENT Right  02/11/2023   Procedure: INSERTION PORT-A-CATH;  Surgeon: Lucretia Roers, MD;  Location: [facility];  Service: General;  Laterality: Right;    Social History: Social History   Socioeconomic History   Marital status: Married    Spouse name: Not on file   Number of children: Not on file   Years of education: Not on file   Highest education level: Not on file  Occupational History   Not on file  Tobacco Use   Smoking status: Never   Smokeless tobacco: Never  Vaping Use   Vaping status: Never Used  Substance and Sexual Activity   Alcohol use: Yes    Comment: occ   Drug use: No   Sexual activity: Not Currently    Birth control/protection: Surgical  Other Topics Concern   Not on file  Social History Narrative   Not on file   Social Determinants of Health   Financial Resource Strain: Not on file  Food Insecurity: No Food Insecurity (12/23/2022)   Hunger Vital Sign    Worried About Running Out of  Food in the Last Year: Never true    Ran Out of Food in the Last Year: Never true  Transportation Needs: No Transportation Needs (12/23/2022)   PRAPARE - Transportation    Lack of Transportation (Medical): No    Lack of Transportation (Non-Medical): No  Physical Activity: Not on file  Stress: Not on file  Social Connections: Unknown (03/12/2022)   Received from [facility]   Social Network    Social Network: Not on file  Intimate Partner Violence: Not At Risk (12/23/2022)   Humiliation, Afraid, Rape, and Kick questionnaire    Fear of Current or Ex-Partner: No    Emotionally Abused: No    Physically Abused: No    Sexually Abused: No    Family History: Family History  Problem Relation Age of Onset   Stroke Mother    CAD Mother    Prostate cancer Father 65   Kidney cancer Brother        dx 71s   Factor V Leiden deficiency Brother    Fibromyalgia Brother    Breast cancer Paternal Grandmother        dx >50   Lung cancer Paternal Grandfather     Current Medications:  Current Outpatient Medications:    acetaminophen (TYLENOL) 500 MG tablet, Take 1,000 mg by mouth every 6 (six) hours as needed for moderate pain or headache., Disp: , Rfl:    aluminum-magnesium hydroxide 200-200 MG/5ML suspension, Take 15 mLs by mouth 4 (four) times daily as needed for indigestion. Mix 1:1 with lidocaine and swish and swallow, Disp: 355 mL, Rfl: 0   gabapentin (NEURONTIN) 300 MG capsule, Take 1 capsule (300 mg total) by mouth 2 (two) times daily., Disp: 60 capsule, Rfl: 2   ibuprofen (ADVIL,MOTRIN) 200 MG tablet, Take 400 mg by mouth every 6 (six) hours as needed for headache or moderate pain., Disp: , Rfl:    levothyroxine (SYNTHROID, LEVOTHROID) 88 MCG tablet, TAKE 88 MCG BY MOUTH IN THE MORNING, Disp: 90 tablet, Rfl: 3   lidocaine (XYLOCAINE) 2 % solution, Use as directed 15 mLs in the mouth or throat 4 (four) times daily as needed for mouth pain. Mix 1:1 with maalox and swish and swallow, Disp:  100 mL, Rfl: 0   lidocaine-prilocaine (EMLA) cream, Apply a quarter sized amount to port a cath site and cover with plastic wrap 1 hour prior to infusion appointments, Disp: 30 g, Rfl:  3   loratadine (CLARITIN) 10 MG tablet, Take 10 mg by mouth daily., Disp: , Rfl:    ondansetron (ZOFRAN) 4 MG tablet, Take 1 tablet (4 mg total) by mouth every 8 (eight) hours as needed., Disp: 30 tablet, Rfl: 1   oxyCODONE (ROXICODONE) 5 MG immediate release tablet, Take 1 tablet (5 mg total) by mouth every 4 (four) hours as needed for severe pain or breakthrough pain., Disp: 5 tablet, Rfl: 0   PACLitaxel (TAXOL IV), Inject into the vein once a week., Disp: , Rfl:    Pegfilgrastim-cbqv (UDENYCA [place]), Inject into the skin every 14 (fourteen) days., Disp: , Rfl:    Polyethylene Glycol 400 (VISINE DRY EYE RELIEF OP), Place 1 drop into both eyes daily as needed (dry eye)., Disp: , Rfl:    potassium chloride SA (KLOR-CON M) 20 MEQ tablet, Take 1 tablet (20 mEq total) by mouth daily., Disp: 30 tablet, Rfl: 3   promethazine (PHENERGAN) 25 MG tablet, Take 1 tablet (25 mg total) by mouth every 6 (six) hours as needed for nausea or vomiting., Disp: 30 tablet, Rfl: 3   rOPINIRole (REQUIP) 2 MG tablet, TAKE ONE TABLET BY MOUTH AT BEDTIME, Disp: 30 tablet, Rfl: 9   sucralfate (CARAFATE) 1 GM/10ML suspension, Take 10 mLs (1 g total) by mouth 4 (four) times daily -  with meals and at bedtime., Disp: 420 mL, Rfl: 0   TRINTELLIX 20 MG TABS tablet, Take 20 mg by mouth daily., Disp: , Rfl:    trolamine salicylate (ASPERCREME) 10 % cream, Apply 1 application topically as needed for muscle pain., Disp: , Rfl:    XARELTO 20 MG TABS tablet, , Disp: , Rfl: 2 No current facility-administered medications for this visit.  Facility-Administered Medications Ordered in Other Visits:    sodium chloride flush (NS) 0.9 % injection 10 mL, 10 mL, Intracatheter, PRN, [name], MD, 10 mL at 06/21/23 1419   Allergies: No Known  Allergies  REVIEW OF SYSTEMS:   Review of Systems  Constitutional:  Negative for chills, fatigue and fever.  HENT:   Negative for lump/mass, mouth sores, nosebleeds, sore throat and trouble swallowing.   Eyes:  Negative for eye problems.  Respiratory:  Positive for shortness of breath (with exertion). Negative for cough.   Cardiovascular:  Negative for chest pain, leg swelling and palpitations.  Gastrointestinal:  Negative for abdominal pain, constipation, diarrhea, nausea and vomiting.  Genitourinary:  Negative for bladder incontinence, difficulty urinating, dysuria, frequency, hematuria and nocturia.   Musculoskeletal:  Negative for arthralgias, back pain, flank pain, myalgias and neck pain.  Skin:  Negative for itching and rash.  Neurological:  Positive for numbness (fingertips, 4/10 severity). Negative for dizziness and headaches.  Hematological:  Does not bruise/bleed easily.  Psychiatric/Behavioral:  Positive for sleep disturbance. Negative for depression and suicidal ideas. The patient is not nervous/anxious.   All other systems reviewed and are negative.    VITALS:   There were no vitals taken for this visit.  Wt Readings from Last 3 Encounters:  06/21/23 194 lb 11.2 oz (88.3 kg)  06/14/23 194 lb 3.2 oz (88.1 kg)  06/07/23 196 lb 3.2 oz (89 kg)    There is no height or weight on file to calculate BMI.  Performance status (ECOG): 1 - Symptomatic but completely ambulatory  PHYSICAL EXAM:   Physical Exam Vitals and nursing note reviewed. Exam conducted with a chaperone present.  Constitutional:      Appearance: Normal appearance.  Cardiovascular:  Rate and Rhythm: Normal rate and regular rhythm.     Pulses: Normal pulses.     Heart sounds: Normal heart sounds.  Pulmonary:     Effort: Pulmonary effort is normal.     Breath sounds: Normal breath sounds.  Abdominal:     Palpations: Abdomen is soft. There is no hepatomegaly, splenomegaly or mass.     Tenderness:  There is no abdominal tenderness.  Musculoskeletal:     Right lower leg: No edema.     Left lower leg: No edema.  Lymphadenopathy:     Cervical: No cervical adenopathy.     Right cervical: No superficial, deep or posterior cervical adenopathy.    Left cervical: No superficial, deep or posterior cervical adenopathy.     Upper Body:     Right upper body: No supraclavicular or axillary adenopathy.     Left upper body: No supraclavicular or axillary adenopathy.  Neurological:     General: No focal deficit present.     Mental Status: She is alert and oriented to person, place, and time.  Psychiatric:        Mood and Affect: Mood normal.        Behavior: Behavior normal.     LABS:      Latest Ref Rng & Units 06/21/2023   10:49 AM 06/14/2023   10:57 AM 06/07/2023   10:01 AM  CBC  WBC 4.0 - 10.5 K/uL 6.6  5.7  5.1   Hemoglobin 12.0 - 15.0 g/dL 16.1  09.6  9.7   Hematocrit 36.0 - 46.0 % 33.5  32.5  29.9   Platelets 150 - 400 K/uL 260  265  239       Latest Ref Rng & Units 06/21/2023   10:49 AM 06/14/2023   10:57 AM 06/07/2023   10:01 AM  CMP  Glucose 70 - 99 mg/dL 045  88  92   BUN 6 - 20 mg/dL 11  9  9    Creatinine 0.44 - 1.00 mg/dL 4.09  8.11  9.14   Sodium 135 - 145 mmol/L 136  137  138   Potassium 3.5 - 5.1 mmol/L 3.7  3.6  3.5   Chloride 98 - 111 mmol/L 103  104  105   CO2 22 - 32 mmol/L 24  25  23    Calcium 8.9 - 10.3 mg/dL 8.8  8.7  8.7   Total Protein 6.5 - 8.1 g/dL 6.4  6.5  6.5   Total Bilirubin 0.3 - 1.2 mg/dL 0.5  0.3  0.4   Alkaline Phos 38 - 126 U/L 94  92  92   AST 15 - 41 U/L 37  28  30   ALT 0 - 44 U/L 43  33  36      No results found for: "CEA1", "CEA" / No results found for: "CEA1", "CEA" No results found for: "PSA1" No results found for: "NWG956" No results found for: "CAN125"  No results found for: "TOTALPROTELP", "ALBUMINELP", "A1GS", "A2GS", "BETS", "BETA2SER", "GAMS", "MSPIKE", "SPEI" Lab Results  Component Value Date   FERRITIN 110 11/13/2013    FERRITIN 81 08/07/2013   No results found for: "LDH"   STUDIES:   No results found.

## 2023-06-21 ENCOUNTER — Inpatient Hospital Stay: Payer: BC Managed Care – PPO | Admitting: Hematology

## 2023-06-21 ENCOUNTER — Inpatient Hospital Stay: Payer: BC Managed Care – PPO

## 2023-06-21 ENCOUNTER — Inpatient Hospital Stay (HOSPITAL_BASED_OUTPATIENT_CLINIC_OR_DEPARTMENT_OTHER): Payer: BC Managed Care – PPO | Admitting: Hematology

## 2023-06-21 VITALS — BP 124/77 | HR 84 | Temp 98.0°F | Resp 18

## 2023-06-21 DIAGNOSIS — Z170 Estrogen receptor positive status [ER+]: Secondary | ICD-10-CM | POA: Diagnosis not present

## 2023-06-21 DIAGNOSIS — C50412 Malignant neoplasm of upper-outer quadrant of left female breast: Secondary | ICD-10-CM

## 2023-06-21 DIAGNOSIS — Z95828 Presence of other vascular implants and grafts: Secondary | ICD-10-CM

## 2023-06-21 DIAGNOSIS — Z5111 Encounter for antineoplastic chemotherapy: Secondary | ICD-10-CM | POA: Diagnosis not present

## 2023-06-21 LAB — CBC WITH DIFFERENTIAL/PLATELET
Abs Immature Granulocytes: 0.02 10*3/uL (ref 0.00–0.07)
Basophils Absolute: 0.1 10*3/uL (ref 0.0–0.1)
Basophils Relative: 1 %
Eosinophils Absolute: 0.1 10*3/uL (ref 0.0–0.5)
Eosinophils Relative: 2 %
HCT: 33.5 % — ABNORMAL LOW (ref 36.0–46.0)
Hemoglobin: 11 g/dL — ABNORMAL LOW (ref 12.0–15.0)
Immature Granulocytes: 0 %
Lymphocytes Relative: 18 %
Lymphs Abs: 1.2 10*3/uL (ref 0.7–4.0)
MCH: 33.3 pg (ref 26.0–34.0)
MCHC: 32.8 g/dL (ref 30.0–36.0)
MCV: 101.5 fL — ABNORMAL HIGH (ref 80.0–100.0)
Monocytes Absolute: 0.6 10*3/uL (ref 0.1–1.0)
Monocytes Relative: 8 %
Neutro Abs: 4.6 10*3/uL (ref 1.7–7.7)
Neutrophils Relative %: 71 %
Platelets: 260 10*3/uL (ref 150–400)
RBC: 3.3 MIL/uL — ABNORMAL LOW (ref 3.87–5.11)
RDW: 14 % (ref 11.5–15.5)
WBC: 6.6 10*3/uL (ref 4.0–10.5)
nRBC: 0 % (ref 0.0–0.2)

## 2023-06-21 LAB — COMPREHENSIVE METABOLIC PANEL
ALT: 43 U/L (ref 0–44)
AST: 37 U/L (ref 15–41)
Albumin: 3.6 g/dL (ref 3.5–5.0)
Alkaline Phosphatase: 94 U/L (ref 38–126)
Anion gap: 9 (ref 5–15)
BUN: 11 mg/dL (ref 6–20)
CO2: 24 mmol/L (ref 22–32)
Calcium: 8.8 mg/dL — ABNORMAL LOW (ref 8.9–10.3)
Chloride: 103 mmol/L (ref 98–111)
Creatinine, Ser: 0.58 mg/dL (ref 0.44–1.00)
GFR, Estimated: >60 mL/min (ref >60)
Glucose, Bld: 104 mg/dL — ABNORMAL HIGH (ref 70–99)
Potassium: 3.7 mmol/L (ref 3.5–5.1)
Sodium: 136 mmol/L (ref 135–145)
Total Bilirubin: 0.5 mg/dL (ref 0.3–1.2)
Total Protein: 6.4 g/dL — ABNORMAL LOW (ref 6.5–8.1)

## 2023-06-21 LAB — MAGNESIUM: Magnesium: 1.8 mg/dL (ref 1.7–2.4)

## 2023-06-21 MED ORDER — SODIUM CHLORIDE 0.9 % IV SOLN
64.0000 mg/m2 | Freq: Once | INTRAVENOUS | Status: AC
  Administered 2023-06-21: 126 mg via INTRAVENOUS
  Filled 2023-06-21: qty 21

## 2023-06-21 MED ORDER — FAMOTIDINE IN NACL 20-0.9 MG/50ML-% IV SOLN
20.0000 mg | Freq: Once | INTRAVENOUS | Status: AC
  Administered 2023-06-21: 20 mg via INTRAVENOUS
  Filled 2023-06-21: qty 50

## 2023-06-21 MED ORDER — SODIUM CHLORIDE 0.9 % IV SOLN: Freq: Once | INTRAVENOUS | Status: AC

## 2023-06-21 MED ORDER — SODIUM CHLORIDE 0.9% FLUSH
10.0000 mL | INTRAVENOUS | Status: DC | PRN
  Administered 2023-06-21: 10 mL

## 2023-06-21 MED ORDER — CETIRIZINE HCL 10 MG/ML IV SOLN
10.0000 mg | Freq: Once | INTRAVENOUS | Status: AC
  Administered 2023-06-21: 10 mg via INTRAVENOUS
  Filled 2023-06-21: qty 1

## 2023-06-21 MED ORDER — SODIUM CHLORIDE 0.9 % IV SOLN
10.0000 mg | Freq: Once | INTRAVENOUS | Status: AC
  Administered 2023-06-21: 10 mg via INTRAVENOUS
  Filled 2023-06-21: qty 10

## 2023-06-21 MED ORDER — HEPARIN SOD (PORK) LOCK FLUSH 100 UNIT/ML IV SOLN
500.0000 [IU] | Freq: Once | INTRAVENOUS | Status: AC | PRN
  Administered 2023-06-21: 500 [IU]

## 2023-06-21 NOTE — Progress Notes (Signed)
Patient has been examined by Dr. Katragadda. Vital signs and labs have been reviewed by MD - ANC, Creatinine, LFTs, hemoglobin, and platelets are within treatment parameters per M.D. - pt may proceed with treatment.  Primary RN and pharmacy notified.  

## 2023-06-21 NOTE — Patient Instructions (Signed)

## 2023-06-21 NOTE — Progress Notes (Signed)
Patient presents today for Taxol infusion per providers order.  Vital signs and labs reviewed by MD.  Message received from Chapman Moss RN/Dr. Ellin Saba patient is okay for treatment.  Treatment given today per MD orders.  Stable during infusion without adverse affects.  Vital signs stable.  No complaints at this time.  Discharge from clinic ambulatory in stable condition.  Alert and oriented X 3.  Follow up with [facility] as scheduled.

## 2023-06-21 NOTE — Patient Instructions (Signed)
 [facility]  Discharge Instructions: Thank you for choosing [facility] to provide your oncology and hematology care.  If you have a lab appointment with the Cancer Center - please note that after April 8th, 2024, all labs will be drawn in the cancer center.  You do not have to check in or register with the main entrance as you have in the past but will complete your check-in in the cancer center.  Wear comfortable clothing and clothing appropriate for easy access to any Portacath or PICC line.   We strive to give you quality time with your provider. You may need to reschedule your appointment if you arrive late (15 or more minutes).  Arriving late affects you and other patients whose appointments are after yours.  Also, if you miss three or more appointments without notifying the office, you may be dismissed from the clinic at the provider's discretion.      For prescription refill requests, have your pharmacy contact our office and allow 72 hours for refills to be completed.    Today you received the following chemotherapy and/or immunotherapy agents Taxol      To help prevent nausea and vomiting after your treatment, we encourage you to take your nausea medication as directed.  BELOW ARE SYMPTOMS THAT SHOULD BE REPORTED IMMEDIATELY: *FEVER GREATER THAN 100.4 F (38 C) OR HIGHER *CHILLS OR SWEATING *NAUSEA AND VOMITING THAT IS NOT CONTROLLED WITH YOUR NAUSEA MEDICATION *UNUSUAL SHORTNESS OF BREATH *UNUSUAL BRUISING OR BLEEDING *URINARY PROBLEMS (pain or burning when urinating, or frequent urination) *BOWEL PROBLEMS (unusual diarrhea, constipation, pain near the anus) TENDERNESS IN MOUTH AND THROAT WITH OR WITHOUT PRESENCE OF ULCERS (sore throat, sores in mouth, or a toothache) UNUSUAL RASH, SWELLING OR PAIN  UNUSUAL VAGINAL DISCHARGE OR ITCHING   Items with * indicate a potential emergency and should be followed up as soon as possible or go to the  Emergency Department if any problems should occur.  Please show the CHEMOTHERAPY ALERT CARD or IMMUNOTHERAPY ALERT CARD at check-in to the Emergency Department and triage nurse.  Should you have questions after your visit or need to cancel or reschedule your appointment, please contact [facility] [phone]  and follow the prompts.  Office hours are 8:00 a.m. to 4:30 p.m. Monday - Friday. Please note that voicemails left after 4:00 p.m. may not be returned until the following business day.  We are closed weekends and major holidays. You have access to a nurse at all times for urgent questions. Please call the main number to the clinic [phone] and follow the prompts.  For any non-urgent questions, you may also contact your provider using MyChart. We now offer e-Visits for anyone 5 and older to request care online for non-urgent symptoms. For details visit [email or web address].   Also download the MyChart app! Go to the app store, search "MyChart", open the app, select [place], and log in with your MyChart username and password.

## 2023-06-28 ENCOUNTER — Inpatient Hospital Stay: Payer: BC Managed Care – PPO

## 2023-06-28 ENCOUNTER — Inpatient Hospital Stay: Payer: BC Managed Care – PPO | Admitting: Hematology

## 2023-06-28 VITALS — BP 140/63 | HR 89 | Temp 98.6°F | Resp 16

## 2023-06-28 DIAGNOSIS — C50412 Malignant neoplasm of upper-outer quadrant of left female breast: Secondary | ICD-10-CM

## 2023-06-28 DIAGNOSIS — Z5111 Encounter for antineoplastic chemotherapy: Secondary | ICD-10-CM | POA: Diagnosis not present

## 2023-06-28 DIAGNOSIS — Z95828 Presence of other vascular implants and grafts: Secondary | ICD-10-CM

## 2023-06-28 LAB — CBC WITH DIFFERENTIAL/PLATELET
Abs Immature Granulocytes: 0.02 10*3/uL (ref 0.00–0.07)
Basophils Absolute: 0.1 10*3/uL (ref 0.0–0.1)
Basophils Relative: 1 %
Eosinophils Absolute: 0.1 10*3/uL (ref 0.0–0.5)
Eosinophils Relative: 2 %
HCT: 32.9 % — ABNORMAL LOW (ref 36.0–46.0)
Hemoglobin: 10.8 g/dL — ABNORMAL LOW (ref 12.0–15.0)
Immature Granulocytes: 0 %
Lymphocytes Relative: 17 %
Lymphs Abs: 1.1 10*3/uL (ref 0.7–4.0)
MCH: 33.5 pg (ref 26.0–34.0)
MCHC: 32.8 g/dL (ref 30.0–36.0)
MCV: 102.2 fL — ABNORMAL HIGH (ref 80.0–100.0)
Monocytes Absolute: 0.7 10*3/uL (ref 0.1–1.0)
Monocytes Relative: 10 %
Neutro Abs: 4.4 10*3/uL (ref 1.7–7.7)
Neutrophils Relative %: 70 %
Platelets: 250 10*3/uL (ref 150–400)
RBC: 3.22 MIL/uL — ABNORMAL LOW (ref 3.87–5.11)
RDW: 13.3 % (ref 11.5–15.5)
WBC: 6.4 10*3/uL (ref 4.0–10.5)
nRBC: 0 % (ref 0.0–0.2)

## 2023-06-28 LAB — COMPREHENSIVE METABOLIC PANEL
ALT: 28 U/L (ref 0–44)
AST: 24 U/L (ref 15–41)
Albumin: 3.5 g/dL (ref 3.5–5.0)
Alkaline Phosphatase: 94 U/L (ref 38–126)
Anion gap: 8 (ref 5–15)
BUN: 9 mg/dL (ref 6–20)
CO2: 24 mmol/L (ref 22–32)
Calcium: 8.7 mg/dL — ABNORMAL LOW (ref 8.9–10.3)
Chloride: 104 mmol/L (ref 98–111)
Creatinine, Ser: 0.52 mg/dL (ref 0.44–1.00)
GFR, Estimated: >60 mL/min (ref >60)
Glucose, Bld: 97 mg/dL (ref 70–99)
Potassium: 3.6 mmol/L (ref 3.5–5.1)
Sodium: 136 mmol/L (ref 135–145)
Total Bilirubin: 0.4 mg/dL (ref 0.3–1.2)
Total Protein: 6.4 g/dL — ABNORMAL LOW (ref 6.5–8.1)

## 2023-06-28 LAB — MAGNESIUM: Magnesium: 1.8 mg/dL (ref 1.7–2.4)

## 2023-06-28 MED ORDER — FAMOTIDINE IN NACL 20-0.9 MG/50ML-% IV SOLN
20.0000 mg | Freq: Once | INTRAVENOUS | Status: AC
  Administered 2023-06-28: 20 mg via INTRAVENOUS
  Filled 2023-06-28: qty 50

## 2023-06-28 MED ORDER — SODIUM CHLORIDE 0.9% FLUSH
10.0000 mL | INTRAVENOUS | Status: DC | PRN
  Administered 2023-06-28: 10 mL via INTRAVENOUS

## 2023-06-28 MED ORDER — HEPARIN SOD (PORK) LOCK FLUSH 100 UNIT/ML IV SOLN: 500.0000 [IU] | Freq: Once | INTRAVENOUS | Status: DC

## 2023-06-28 MED ORDER — SODIUM CHLORIDE 0.9 % IV SOLN
10.0000 mg | Freq: Once | INTRAVENOUS | Status: AC
  Administered 2023-06-28: 10 mg via INTRAVENOUS
  Filled 2023-06-28: qty 10

## 2023-06-28 MED ORDER — CETIRIZINE HCL 10 MG/ML IV SOLN
10.0000 mg | Freq: Once | INTRAVENOUS | Status: AC
  Administered 2023-06-28: 10 mg via INTRAVENOUS
  Filled 2023-06-28: qty 1

## 2023-06-28 MED ORDER — SODIUM CHLORIDE 0.9 % IV SOLN: Freq: Once | INTRAVENOUS | Status: AC

## 2023-06-28 MED ORDER — HEPARIN SOD (PORK) LOCK FLUSH 100 UNIT/ML IV SOLN
500.0000 [IU] | Freq: Once | INTRAVENOUS | Status: AC | PRN
  Administered 2023-06-28: 500 [IU]

## 2023-06-28 MED ORDER — SODIUM CHLORIDE 0.9% FLUSH
10.0000 mL | INTRAVENOUS | Status: DC | PRN
  Administered 2023-06-28: 10 mL

## 2023-06-28 MED ORDER — SODIUM CHLORIDE 0.9 % IV SOLN
64.0000 mg/m2 | Freq: Once | INTRAVENOUS | Status: AC
  Administered 2023-06-28: 126 mg via INTRAVENOUS
  Filled 2023-06-28: qty 21

## 2023-06-28 NOTE — Progress Notes (Signed)
 Patient presents today for Taxol infusion per providers order.  Vital signs and labs within parameters for treatment.  Patient has no new complaints at this time.  Treatment given today per MD orders.  Stable during infusion without adverse affects.  Vital signs stable.  No complaints at this time.  Discharge from clinic ambulatory in stable condition.  Alert and oriented X 3.  Follow up with [facility] as scheduled.

## 2023-06-28 NOTE — Patient Instructions (Signed)
 [facility]  Discharge Instructions: Thank you for choosing [facility] to provide your oncology and hematology care.  If you have a lab appointment with the Cancer Center - please note that after April 8th, 2024, all labs will be drawn in the cancer center.  You do not have to check in or register with the main entrance as you have in the past but will complete your check-in in the cancer center.  Wear comfortable clothing and clothing appropriate for easy access to any Portacath or PICC line.   We strive to give you quality time with your provider. You may need to reschedule your appointment if you arrive late (15 or more minutes).  Arriving late affects you and other patients whose appointments are after yours.  Also, if you miss three or more appointments without notifying the office, you may be dismissed from the clinic at the provider's discretion.      For prescription refill requests, have your pharmacy contact our office and allow 72 hours for refills to be completed.    Today you received the following chemotherapy and/or immunotherapy agents Taxol      To help prevent nausea and vomiting after your treatment, we encourage you to take your nausea medication as directed.  BELOW ARE SYMPTOMS THAT SHOULD BE REPORTED IMMEDIATELY: *FEVER GREATER THAN 100.4 F (38 C) OR HIGHER *CHILLS OR SWEATING *NAUSEA AND VOMITING THAT IS NOT CONTROLLED WITH YOUR NAUSEA MEDICATION *UNUSUAL SHORTNESS OF BREATH *UNUSUAL BRUISING OR BLEEDING *URINARY PROBLEMS (pain or burning when urinating, or frequent urination) *BOWEL PROBLEMS (unusual diarrhea, constipation, pain near the anus) TENDERNESS IN MOUTH AND THROAT WITH OR WITHOUT PRESENCE OF ULCERS (sore throat, sores in mouth, or a toothache) UNUSUAL RASH, SWELLING OR PAIN  UNUSUAL VAGINAL DISCHARGE OR ITCHING   Items with * indicate a potential emergency and should be followed up as soon as possible or go to the  Emergency Department if any problems should occur.  Please show the CHEMOTHERAPY ALERT CARD or IMMUNOTHERAPY ALERT CARD at check-in to the Emergency Department and triage nurse.  Should you have questions after your visit or need to cancel or reschedule your appointment, please contact [facility] [phone]  and follow the prompts.  Office hours are 8:00 a.m. to 4:30 p.m. Monday - Friday. Please note that voicemails left after 4:00 p.m. may not be returned until the following business day.  We are closed weekends and major holidays. You have access to a nurse at all times for urgent questions. Please call the main number to the clinic [phone] and follow the prompts.  For any non-urgent questions, you may also contact your provider using MyChart. We now offer e-Visits for anyone 5 and older to request care online for non-urgent symptoms. For details visit [email or web address].   Also download the MyChart app! Go to the app store, search "MyChart", open the app, select [place], and log in with your MyChart username and password.

## 2023-06-28 NOTE — Progress Notes (Signed)
Patients port flushed without difficulty.  Good blood return noted with no bruising or swelling noted at site.  Patient remains accessed for treatment.  

## 2023-07-04 NOTE — Progress Notes (Signed)
[facility] [address] [place] [address]    Clinic Day:  07/05/23     Referring physician: [name], PA-C  Patient Care Team: [name], PA-C as PCP - General (Physician Assistant) [name], MD as Medical Oncologist (Medical Oncology)   ASSESSMENT & PLAN:   Assessment: 1.  Stage I (T1c N1 M0, G2, ER/PR+, HER2-) left breast UOQ IDC: - Screening mammogram (12/01/2022): Possible distortion in the left breast.  Right breast has no findings suspicious for malignancy. - Left breast diagnostic mammogram/US (12/14/2022): Irregular hypoechoic mass at the 1 o'clock position of the left breast, 4 cm from nipple, measuring 1 x 0.7 x 0.9 cm.  No pathologic lymphadenopathy in the left axilla. - Left breast biopsy (12/16/2022): Invasive ductal carcinoma, grade 2, ER/PR 100%, Ki-67 1%, HER2 0 by IHC. - Left breast lumpectomy and SLNB on 01/10/2023. - Pathology: 1.4 x 1.1 x 1.1 cm moderately differentiated IDC, grade 2, margins negative.  1 sentinel lymph node with metastatic carcinoma (1.1 mm).  1 lymph node with micrometastasis (1 mm).  3 other sentinel lymph nodes negative for invasive carcinoma.  PT1 cpN1a. - Oncotype DX recurrence score 14. - CT CAP (02/08/2023): Left Staebler 1.4 cm sclerotic lesion indeterminate.  No evidence of metastatic disease.  Right nephrolithiasis. - PET scan (02/10/2023): Postop changes in the left breast and axilla.  No uptake in the left established lesion. - 2D echo (02/16/2023): LVEF 55 to 60%. - Cycle 1 AC on 02/22/2023   2.  Social/family history: - She lives at home with her husband.  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  No prior history of smoking. - Paternal grandmother had breast cancer.  Brother had kidney cancer.  Father had prostate cancer.   3.  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  Probable infarct  in the left lung base. - Heterozygosity for factor V Leiden mutation.  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  PT mutation negative.  Protein C, protein S, AT III normal. - She has been on Xarelto since then.   4.  Bone health: - DEXA scan (01/04/2023): T-score -0.3, normal.    Plan: 1.  Stage IB (T1c N1 M0, G2, ER/PR+, HER2-) left breast UOQ IDC: - Neuropathy symptoms have been stable since dose reduction. - Reviewed labs today: Normal LFTs and creatinine.  CBC grossly normal. - Proceed with last treatment today with weekly paclitaxel. - She has follow-up with radiation oncology on 07/07/2023 to discuss treatment. - She had NovaSure endometrial ablation and bilateral salpingectomy for with pelvic cramping done on 11/08/2017.  She started having some hot flashes and mood changes prior to ablation. - Recommend checking LH, FSH and estradiol levels. - Will start her on antiestrogen therapy at next visit in 6 weeks.   2.  Peripheral neuropathy: - Continue gabapentin 300 mg at bedtime. - Reported right middle fingertip is slightly more sore than the rest of the fingertips.   3.  Restless leg syndrome: - Continue Requip twice daily.  Well-controlled.   4.  Hypokalemia: - Continue potassium 20 mill equivalents daily.  Potassium is 3.5.  5.  Bone health: - DEXA scan on 01/04/2023 with T-score -0.3. - Will check vitamin D levels.       Orders Placed This Encounter  Procedures   Follicle stimulating hormone   Luteinizing hormone   Estradiol   VITAMIN D 25 Hydroxy (Vit-D Deficiency, Fractures)  CBC with Differential    Standing Status:   Future    Standing Expiration Date:   07/04/2024   Comprehensive metabolic panel    Standing Status:   Future    Standing Expiration Date:   07/04/2024   Magnesium    Standing Status:   Future    Standing Expiration Date:   07/04/2024       Mikeal Hawthorne R Teague,acting as a scribe for [name], MD.,have documented all relevant documentation on the behalf of  [name], MD,as directed by  [name], MD while in the presence of [name], MD.  I, [name] MD, have reviewed the above documentation for accuracy and completeness, and I agree with the above.      [name], MD   9/3/20245:31 PM  CHIEF COMPLAINT:   Diagnosis: left breast cancer    Cancer Staging  Breast cancer of upper-outer quadrant of left female breast [facility]) Staging form: Breast, AJCC 8th Edition - Clinical stage from 12/23/2022: Stage IB (cT1c, cN1(sn), cM0, G2, ER+, PR+, HER2-) - Unsigned    Prior Therapy: 1. Lumpectomy and SLNB, 01/10/23  2. Dose dense AC, 4 cycles, 02/22/23 - 04/05/23  Current Therapy:  weekly paclitaxel    HISTORY OF PRESENT ILLNESS:   Oncology History  Breast cancer of upper-outer quadrant of left female breast (HCC)  12/23/2022 Initial Diagnosis   Breast cancer of upper-outer quadrant of left female breast   02/22/2023 -  Chemotherapy   Patient is on Treatment Plan : BREAST ADJUVANT DOSE DENSE AC q14d / PACLitaxel q7d        INTERVAL HISTORY:   Judy Patton is a 52 y.o. female presenting to clinic today for follow up of left breast cancer. She was last seen by me on 06/21/23.  Today, she states that she is doing well overall. Her appetite level is at 100%. Her energy level is at 70%. She is accompanied by her husband.   She reports stable neuropathy in the hands and feet, except in tip of her right middle finger which has caused mild soreness with numbness and tingling. She notes she was scratching her face yesterday and that her right middle finger had a "weird odor." She is taking Gabapentin as prescribed. She has an appointment with radiology on 07/07/23.   She had an ablation and bilateral salpingectomy in 11/08/17 due to heavy bleeding, cramps, and being on blood thinner. Her menstruation stopped after this. She has hot flashes that began prior to her ablation and salpingectomy.    PAST MEDICAL  HISTORY:   Past Medical History: Past Medical History:  Diagnosis Date   Anxiety    Chronic headaches    Depression    Factor V Leiden mutation (HCC)    Fatigue 02/18/2015   History of kidney stones    Hypothyroidism    MRSA (methicillin resistant Staphylococcus aureus)    Pain of tooth socket 03/14/2015   Pancreatitis    PONV (postoperative nausea and vomiting)    Port-A-Cath in place 02/14/2023   Pulmonary embolus (HCC)    Restless leg syndrome    Shingles rash 03/14/2015   Shoulder injury    Stress    family   Weight gain 02/18/2015    Surgical History: Past Surgical History:  Procedure Laterality Date   BREAST BIOPSY Left 12/16/2022   [place] LT BREAST BX W LOC DEV 1ST LESION IMG BX SPEC US GUIDE 12/16/2022 [facility]   BREAST BIOPSY Left 01/04/2023   [place] LT RADIO FREQUENCY  TAG LOC US GUIDE 01/04/2023 Edwin Cap, MD [facility]   COLONOSCOPY  03/25/2004   NWG:NFAO canal hemorrhoids, otherwise normal rectum,   DILITATION & CURRETTAGE/HYSTROSCOPY WITH NOVASURE ABLATION N/A 11/08/2017   Procedure: DILATATION & CURETTAGE/HYSTEROSCOPY WITH NOVASURE ENDOMETRIAL ABLATION;  Surgeon: Tilda Burrow, MD;  Location: [facility];  Service: Gynecology;  Laterality: N/A;   ESOPHAGOGASTRODUODENOSCOPY  03/25/2004   ZHY:QMVHQI esophagus, small hiatal hernia, otherwise normal stomach   kidney stones  05/2014,11/2014   LAPAROSCOPIC BILATERAL SALPINGECTOMY Bilateral 11/08/2017   Procedure: LAPAROSCOPIC BILATERAL SALPINGECTOMY;  Surgeon: Tilda Burrow, MD;  Location: [facility];  Service: Gynecology;  Laterality: Bilateral;   LITHOTRIPSY  2008   PARTIAL MASTECTOMY WITH AXILLARY SENTINEL LYMPH NODE BIOPSY Left 01/10/2023   Procedure: PARTIAL MASTECTOMY WITH AXILLARY SENTINEL LYMPH NODE BIOPSY AND RADIOFREQUENCY TAG PLACEMENT;  Surgeon: Lucretia Roers, MD;  Location: [facility];  Service: General;  Laterality: Left;   PORTACATH PLACEMENT Right 02/11/2023   Procedure: INSERTION PORT-A-CATH;  Surgeon:  Lucretia Roers, MD;  Location: [facility];  Service: General;  Laterality: Right;    Social History: Social History   Socioeconomic History   Marital status: Married    Spouse name: Not on file   Number of children: Not on file   Years of education: Not on file   Highest education level: Not on file  Occupational History   Not on file  Tobacco Use   Smoking status: Never   Smokeless tobacco: Never  Vaping Use   Vaping status: Never Used  Substance and Sexual Activity   Alcohol use: Yes    Comment: occ   Drug use: No   Sexual activity: Not Currently    Birth control/protection: Surgical  Other Topics Concern   Not on file  Social History Narrative   Not on file   Social Determinants of Health   Financial Resource Strain: Not on file  Food Insecurity: No Food Insecurity (12/23/2022)   Hunger Vital Sign    Worried About Running Out of Food in the Last Year: Never true    Ran Out of Food in the Last Year: Never true  Transportation Needs: No Transportation Needs (12/23/2022)   PRAPARE - Transportation    Lack of Transportation (Medical): No    Lack of Transportation (Non-Medical): No  Physical Activity: Not on file  Stress: Not on file  Social Connections: Unknown (03/12/2022)   [facility]   Social Network    Social Network: Not on file  Intimate Partner Violence: Not At Risk (12/23/2022)   Humiliation, Afraid, Rape, and Kick questionnaire    Fear of Current or Ex-Partner: No    Emotionally Abused: No    Physically Abused: No    Sexually Abused: No    Family History: Family History  Problem Relation Age of Onset   Stroke Mother    CAD Mother    Prostate cancer Father 45   Kidney cancer Brother        dx 17s   Factor V Leiden deficiency Brother    Fibromyalgia Brother    Breast cancer Paternal Grandmother        dx >50   Lung cancer Paternal Grandfather     Current Medications:  Current Outpatient Medications:     acetaminophen (TYLENOL) 500 MG tablet, Take 1,000 mg by mouth every 6 (six) hours as needed for moderate pain or headache., Disp: , Rfl:    aluminum-magnesium hydroxide 200-200 MG/5ML suspension, Take 15 mLs by  mouth 4 (four) times daily as needed for indigestion. Mix 1:1 with lidocaine and swish and swallow, Disp: 355 mL, Rfl: 0   gabapentin (NEURONTIN) 300 MG capsule, Take 1 capsule (300 mg total) by mouth 2 (two) times daily., Disp: 60 capsule, Rfl: 2   ibuprofen (ADVIL,MOTRIN) 200 MG tablet, Take 400 mg by mouth every 6 (six) hours as needed for headache or moderate pain., Disp: , Rfl:    levothyroxine (SYNTHROID, LEVOTHROID) 88 MCG tablet, TAKE 88 MCG BY MOUTH IN THE MORNING, Disp: 90 tablet, Rfl: 3   lidocaine (XYLOCAINE) 2 % solution, Use as directed 15 mLs in the mouth or throat 4 (four) times daily as needed for mouth pain. Mix 1:1 with maalox and swish and swallow, Disp: 100 mL, Rfl: 0   lidocaine-prilocaine (EMLA) cream, Apply a quarter sized amount to port a cath site and cover with plastic wrap 1 hour prior to infusion appointments, Disp: 30 g, Rfl: 3   loratadine (CLARITIN) 10 MG tablet, Take 10 mg by mouth daily., Disp: , Rfl:    ondansetron (ZOFRAN) 4 MG tablet, Take 1 tablet (4 mg total) by mouth every 8 (eight) hours as needed., Disp: 30 tablet, Rfl: 1   oxyCODONE (ROXICODONE) 5 MG immediate release tablet, Take 1 tablet (5 mg total) by mouth every 4 (four) hours as needed for severe pain or breakthrough pain., Disp: 5 tablet, Rfl: 0   PACLitaxel (TAXOL IV), Inject into the vein once a week., Disp: , Rfl:    Pegfilgrastim-cbqv (UDENYCA [place]), Inject into the skin every 14 (fourteen) days., Disp: , Rfl:    Polyethylene Glycol 400 (VISINE DRY EYE RELIEF OP), Place 1 drop into both eyes daily as needed (dry eye)., Disp: , Rfl:    potassium chloride SA (KLOR-CON M) 20 MEQ tablet, Take 1 tablet (20 mEq total) by mouth daily., Disp: 30 tablet, Rfl: 3   promethazine (PHENERGAN) 25 MG tablet,  Take 1 tablet (25 mg total) by mouth every 6 (six) hours as needed for nausea or vomiting., Disp: 30 tablet, Rfl: 3   rOPINIRole (REQUIP) 2 MG tablet, TAKE ONE TABLET BY MOUTH AT BEDTIME, Disp: 30 tablet, Rfl: 9   sucralfate (CARAFATE) 1 GM/10ML suspension, Take 10 mLs (1 g total) by mouth 4 (four) times daily -  with meals and at bedtime., Disp: 420 mL, Rfl: 0   TRINTELLIX 20 MG TABS tablet, Take 20 mg by mouth daily., Disp: , Rfl:    trolamine salicylate (ASPERCREME) 10 % cream, Apply 1 application topically as needed for muscle pain., Disp: , Rfl:    XARELTO 20 MG TABS tablet, , Disp: , Rfl: 2 No current facility-administered medications for this visit.  Facility-Administered Medications Ordered in Other Visits:    sodium chloride flush (NS) 0.9 % injection 10 mL, 10 mL, Intracatheter, PRN, [name], MD, 10 mL at 07/05/23 1243   Allergies: No Known Allergies  REVIEW OF SYSTEMS:   Review of Systems  Constitutional:  Negative for chills, fatigue and fever.  HENT:   Negative for lump/mass, mouth sores, nosebleeds, sore throat and trouble swallowing.   Eyes:  Negative for eye problems.  Respiratory:  Negative for cough and shortness of breath.   Cardiovascular:  Negative for chest pain, leg swelling and palpitations.  Gastrointestinal:  Negative for abdominal pain, constipation, diarrhea, nausea and vomiting.  Genitourinary:  Negative for bladder incontinence, difficulty urinating, dysuria, frequency, hematuria and nocturia.   Musculoskeletal:  Negative for arthralgias, back pain, flank pain, myalgias and  neck pain.  Skin:  Negative for itching and rash.  Neurological:  Positive for headaches and numbness (tingling in hands and feet). Negative for dizziness.  Hematological:  Does not bruise/bleed easily.  Psychiatric/Behavioral:  Positive for sleep disturbance. Negative for depression and suicidal ideas. The patient is not nervous/anxious.   All other systems reviewed and are  negative.    VITALS:   There were no vitals taken for this visit.  Wt Readings from Last 3 Encounters:  07/05/23 196 lb 12.8 oz (89.3 kg)  06/28/23 196 lb 6.4 oz (89.1 kg)  06/21/23 194 lb 11.2 oz (88.3 kg)    There is no height or weight on file to calculate BMI.  Performance status (ECOG): 1 - Symptomatic but completely ambulatory  PHYSICAL EXAM:   Physical Exam Vitals and nursing note reviewed. Exam conducted with a chaperone present.  Constitutional:      Appearance: Normal appearance.  Cardiovascular:     Rate and Rhythm: Normal rate and regular rhythm.     Pulses: Normal pulses.     Heart sounds: Normal heart sounds.  Pulmonary:     Effort: Pulmonary effort is normal.     Breath sounds: Normal breath sounds.  Abdominal:     Palpations: Abdomen is soft. There is no hepatomegaly, splenomegaly or mass.     Tenderness: There is no abdominal tenderness.  Musculoskeletal:     Right lower leg: No edema.     Left lower leg: No edema.  Lymphadenopathy:     Cervical: No cervical adenopathy.     Right cervical: No superficial, deep or posterior cervical adenopathy.    Left cervical: No superficial, deep or posterior cervical adenopathy.     Upper Body:     Right upper body: No supraclavicular or axillary adenopathy.     Left upper body: No supraclavicular or axillary adenopathy.  Neurological:     General: No focal deficit present.     Mental Status: She is alert and oriented to person, place, and time.  Psychiatric:        Mood and Affect: Mood normal.        Behavior: Behavior normal.     LABS:      Latest Ref Rng & Units 07/05/2023    8:49 AM 06/28/2023    9:27 AM 06/21/2023   10:49 AM  CBC  WBC 4.0 - 10.5 K/uL 6.5  6.4  6.6   Hemoglobin 12.0 - 15.0 g/dL 16.1  09.6  04.5   Hematocrit 36.0 - 46.0 % 35.7  32.9  33.5   Platelets 150 - 400 K/uL 263  250  260       Latest Ref Rng & Units 07/05/2023    8:49 AM 06/28/2023    9:27 AM 06/21/2023   10:49 AM  CMP   Glucose 70 - 99 mg/dL 409  97  811   BUN 6 - 20 mg/dL 10  9  11    Creatinine 0.44 - 1.00 mg/dL 9.14  7.82  9.56   Sodium 135 - 145 mmol/L 137  136  136   Potassium 3.5 - 5.1 mmol/L 3.5  3.6  3.7   Chloride 98 - 111 mmol/L 103  104  103   CO2 22 - 32 mmol/L 23  24  24    Calcium 8.9 - 10.3 mg/dL 8.8  8.7  8.8   Total Protein 6.5 - 8.1 g/dL 6.7  6.4  6.4   Total Bilirubin 0.3 - 1.2 mg/dL 0.3  0.4  0.5   Alkaline Phos 38 - 126 U/L 94  94  94   AST 15 - 41 U/L 28  24  37   ALT 0 - 44 U/L 25  28  43      No results found for: "CEA1", "CEA" / No results found for: "CEA1", "CEA" No results found for: "PSA1" No results found for: "ZOX096" No results found for: "CAN125"  No results found for: "TOTALPROTELP", "ALBUMINELP", "A1GS", "A2GS", "BETS", "BETA2SER", "GAMS", "MSPIKE", "SPEI" Lab Results  Component Value Date   FERRITIN 110 11/13/2013   FERRITIN 81 08/07/2013   No results found for: "LDH"   STUDIES:   No results found.

## 2023-07-05 ENCOUNTER — Inpatient Hospital Stay: Payer: BC Managed Care – PPO | Attending: Hematology

## 2023-07-05 ENCOUNTER — Inpatient Hospital Stay (HOSPITAL_BASED_OUTPATIENT_CLINIC_OR_DEPARTMENT_OTHER): Payer: BC Managed Care – PPO | Admitting: Hematology

## 2023-07-05 ENCOUNTER — Inpatient Hospital Stay: Payer: BC Managed Care – PPO

## 2023-07-05 VITALS — BP 131/84 | HR 88 | Temp 98.6°F | Resp 16

## 2023-07-05 DIAGNOSIS — Z801 Family history of malignant neoplasm of trachea, bronchus and lung: Secondary | ICD-10-CM | POA: Diagnosis not present

## 2023-07-05 DIAGNOSIS — R232 Flushing: Secondary | ICD-10-CM | POA: Insufficient documentation

## 2023-07-05 DIAGNOSIS — Z86711 Personal history of pulmonary embolism: Secondary | ICD-10-CM | POA: Insufficient documentation

## 2023-07-05 DIAGNOSIS — E876 Hypokalemia: Secondary | ICD-10-CM | POA: Insufficient documentation

## 2023-07-05 DIAGNOSIS — Z7901 Long term (current) use of anticoagulants: Secondary | ICD-10-CM | POA: Diagnosis not present

## 2023-07-05 DIAGNOSIS — Z170 Estrogen receptor positive status [ER+]: Secondary | ICD-10-CM | POA: Insufficient documentation

## 2023-07-05 DIAGNOSIS — Z8042 Family history of malignant neoplasm of prostate: Secondary | ICD-10-CM | POA: Diagnosis not present

## 2023-07-05 DIAGNOSIS — G2581 Restless legs syndrome: Secondary | ICD-10-CM | POA: Insufficient documentation

## 2023-07-05 DIAGNOSIS — Z95828 Presence of other vascular implants and grafts: Secondary | ICD-10-CM

## 2023-07-05 DIAGNOSIS — G479 Sleep disorder, unspecified: Secondary | ICD-10-CM | POA: Diagnosis not present

## 2023-07-05 DIAGNOSIS — Z803 Family history of malignant neoplasm of breast: Secondary | ICD-10-CM | POA: Diagnosis not present

## 2023-07-05 DIAGNOSIS — E559 Vitamin D deficiency, unspecified: Secondary | ICD-10-CM | POA: Diagnosis not present

## 2023-07-05 DIAGNOSIS — Z79899 Other long term (current) drug therapy: Secondary | ICD-10-CM | POA: Diagnosis not present

## 2023-07-05 DIAGNOSIS — G629 Polyneuropathy, unspecified: Secondary | ICD-10-CM | POA: Insufficient documentation

## 2023-07-05 DIAGNOSIS — C50412 Malignant neoplasm of upper-outer quadrant of left female breast: Secondary | ICD-10-CM | POA: Insufficient documentation

## 2023-07-05 DIAGNOSIS — Z8051 Family history of malignant neoplasm of kidney: Secondary | ICD-10-CM | POA: Insufficient documentation

## 2023-07-05 DIAGNOSIS — Z5111 Encounter for antineoplastic chemotherapy: Secondary | ICD-10-CM | POA: Insufficient documentation

## 2023-07-05 LAB — CBC WITH DIFFERENTIAL/PLATELET
Abs Immature Granulocytes: 0.03 10*3/uL (ref 0.00–0.07)
Basophils Absolute: 0.1 10*3/uL (ref 0.0–0.1)
Basophils Relative: 1 %
Eosinophils Absolute: 0.1 10*3/uL (ref 0.0–0.5)
Eosinophils Relative: 2 %
HCT: 35.7 % — ABNORMAL LOW (ref 36.0–46.0)
Hemoglobin: 11.5 g/dL — ABNORMAL LOW (ref 12.0–15.0)
Immature Granulocytes: 1 %
Lymphocytes Relative: 18 %
Lymphs Abs: 1.2 10*3/uL (ref 0.7–4.0)
MCH: 32.9 pg (ref 26.0–34.0)
MCHC: 32.2 g/dL (ref 30.0–36.0)
MCV: 102 fL — ABNORMAL HIGH (ref 80.0–100.0)
Monocytes Absolute: 0.5 10*3/uL (ref 0.1–1.0)
Monocytes Relative: 8 %
Neutro Abs: 4.6 10*3/uL (ref 1.7–7.7)
Neutrophils Relative %: 70 %
Platelets: 263 10*3/uL (ref 150–400)
RBC: 3.5 MIL/uL — ABNORMAL LOW (ref 3.87–5.11)
RDW: 13.1 % (ref 11.5–15.5)
WBC: 6.5 10*3/uL (ref 4.0–10.5)
nRBC: 0 % (ref 0.0–0.2)

## 2023-07-05 LAB — COMPREHENSIVE METABOLIC PANEL
ALT: 25 U/L (ref 0–44)
AST: 28 U/L (ref 15–41)
Albumin: 3.6 g/dL (ref 3.5–5.0)
Alkaline Phosphatase: 94 U/L (ref 38–126)
Anion gap: 11 (ref 5–15)
BUN: 10 mg/dL (ref 6–20)
CO2: 23 mmol/L (ref 22–32)
Calcium: 8.8 mg/dL — ABNORMAL LOW (ref 8.9–10.3)
Chloride: 103 mmol/L (ref 98–111)
Creatinine, Ser: 0.63 mg/dL (ref 0.44–1.00)
GFR, Estimated: >60 mL/min (ref >60)
Glucose, Bld: 156 mg/dL — ABNORMAL HIGH (ref 70–99)
Potassium: 3.5 mmol/L (ref 3.5–5.1)
Sodium: 137 mmol/L (ref 135–145)
Total Bilirubin: 0.3 mg/dL (ref 0.3–1.2)
Total Protein: 6.7 g/dL (ref 6.5–8.1)

## 2023-07-05 LAB — MAGNESIUM: Magnesium: 1.8 mg/dL (ref 1.7–2.4)

## 2023-07-05 LAB — VITAMIN D 25 HYDROXY (VIT D DEFICIENCY, FRACTURES): Vit D, 25-Hydroxy: 19.08 ng/mL — ABNORMAL LOW (ref 30–100)

## 2023-07-05 MED ORDER — SODIUM CHLORIDE 0.9 % IV SOLN
10.0000 mg | Freq: Once | INTRAVENOUS | Status: AC
  Administered 2023-07-05: 10 mg via INTRAVENOUS
  Filled 2023-07-05: qty 10

## 2023-07-05 MED ORDER — HEPARIN SOD (PORK) LOCK FLUSH 100 UNIT/ML IV SOLN
500.0000 [IU] | Freq: Once | INTRAVENOUS | Status: AC | PRN
  Administered 2023-07-05: 500 [IU]

## 2023-07-05 MED ORDER — SODIUM CHLORIDE 0.9% FLUSH
10.0000 mL | INTRAVENOUS | Status: AC
  Administered 2023-07-05: 10 mL

## 2023-07-05 MED ORDER — CETIRIZINE HCL 10 MG/ML IV SOLN
10.0000 mg | Freq: Once | INTRAVENOUS | Status: AC
  Administered 2023-07-05: 10 mg via INTRAVENOUS
  Filled 2023-07-05: qty 1

## 2023-07-05 MED ORDER — SODIUM CHLORIDE 0.9% FLUSH
10.0000 mL | INTRAVENOUS | Status: DC | PRN
  Administered 2023-07-05: 10 mL

## 2023-07-05 MED ORDER — FAMOTIDINE IN NACL 20-0.9 MG/50ML-% IV SOLN
20.0000 mg | Freq: Once | INTRAVENOUS | Status: AC
  Administered 2023-07-05: 20 mg via INTRAVENOUS
  Filled 2023-07-05: qty 50

## 2023-07-05 MED ORDER — SODIUM CHLORIDE 0.9 % IV SOLN
64.0000 mg/m2 | Freq: Once | INTRAVENOUS | Status: AC
  Administered 2023-07-05: 126 mg via INTRAVENOUS
  Filled 2023-07-05: qty 21

## 2023-07-05 MED ORDER — SODIUM CHLORIDE 0.9 % IV SOLN: Freq: Once | INTRAVENOUS | Status: AC

## 2023-07-05 NOTE — Progress Notes (Signed)
Patient has been examined by Dr. Delton Coombes. Vital signs (HR 110) and labs have been reviewed by MD - ANC, Creatinine, LFTs, hemoglobin, and platelets are within treatment parameters per M.D. - pt may proceed with treatment.  Primary RN and pharmacy notified.

## 2023-07-05 NOTE — Progress Notes (Signed)

## 2023-07-05 NOTE — Patient Instructions (Signed)
 [facility]  Discharge Instructions: Thank you for choosing [facility] to provide your oncology and hematology care.  If you have a lab appointment with the Cancer Center - please note that after April 8th, 2024, all labs will be drawn in the cancer center.  You do not have to check in or register with the main entrance as you have in the past but will complete your check-in in the cancer center.  Wear comfortable clothing and clothing appropriate for easy access to any Portacath or PICC line.   We strive to give you quality time with your provider. You may need to reschedule your appointment if you arrive late (15 or more minutes).  Arriving late affects you and other patients whose appointments are after yours.  Also, if you miss three or more appointments without notifying the office, you may be dismissed from the clinic at the provider's discretion.      For prescription refill requests, have your pharmacy contact our office and allow 72 hours for refills to be completed.    Today you received the following chemotherapy and/or immunotherapy agents taxol       To help prevent nausea and vomiting after your treatment, we encourage you to take your nausea medication as directed.  BELOW ARE SYMPTOMS THAT SHOULD BE REPORTED IMMEDIATELY: *FEVER GREATER THAN 100.4 F (38 C) OR HIGHER *CHILLS OR SWEATING *NAUSEA AND VOMITING THAT IS NOT CONTROLLED WITH YOUR NAUSEA MEDICATION *UNUSUAL SHORTNESS OF BREATH *UNUSUAL BRUISING OR BLEEDING *URINARY PROBLEMS (pain or burning when urinating, or frequent urination) *BOWEL PROBLEMS (unusual diarrhea, constipation, pain near the anus) TENDERNESS IN MOUTH AND THROAT WITH OR WITHOUT PRESENCE OF ULCERS (sore throat, sores in mouth, or a toothache) UNUSUAL RASH, SWELLING OR PAIN  UNUSUAL VAGINAL DISCHARGE OR ITCHING   Items with * indicate a potential emergency and should be followed up as soon as possible or go to the  Emergency Department if any problems should occur.  Please show the CHEMOTHERAPY ALERT CARD or IMMUNOTHERAPY ALERT CARD at check-in to the Emergency Department and triage nurse.  Should you have questions after your visit or need to cancel or reschedule your appointment, please contact [facility] [phone]  and follow the prompts.  Office hours are 8:00 a.m. to 4:30 p.m. Monday - Friday. Please note that voicemails left after 4:00 p.m. may not be returned until the following business day.  We are closed weekends and major holidays. You have access to a nurse at all times for urgent questions. Please call the main number to the clinic [phone] and follow the prompts.  For any non-urgent questions, you may also contact your provider using MyChart. We now offer e-Visits for anyone 25 and older to request care online for non-urgent symptoms. For details visit [email or web address].   Also download the MyChart app! Go to the app store, search "MyChart", open the app, select [place], and log in with your MyChart username and password.

## 2023-07-05 NOTE — Patient Instructions (Signed)
[place] at [facility] Discharge Instructions   You were seen and examined today by Dr. Delton Coombes.  He reviewed the results of your lab work which are normal/stable.   We will proceed with your final treatment today.   Return as scheduled.    Thank you for choosing [place] at [facility] to provide your oncology and hematology care.  To afford each patient quality time with our provider, please arrive at least 15 minutes before your scheduled appointment time.   If you have a lab appointment with the [facility] please come in thru the Main Entrance and check in at the main information desk.  You need to re-schedule your appointment should you arrive 10 or more minutes late.  We strive to give you quality time with our providers, and arriving late affects you and other patients whose appointments are after yours.  Also, if you no show three or more times for appointments you may be dismissed from the clinic at the providers discretion.     Again, thank you for choosing [facility].  Our hope is that these requests will decrease the amount of time that you wait before being seen by our physicians.       _____________________________________________________________  Should you have questions after your visit to [facility], please contact our office at [phone] and follow the prompts.  Our office hours are 8:00 a.m. and 4:30 p.m. Monday - Friday.  Please note that voicemails left after 4:00 p.m. may not be returned until the following business day.  We are closed weekends and major holidays.  You do have access to a nurse 24-7, just call the main number to the clinic [phone] and do not press any options, hold on the line and a nurse will answer the phone.    For prescription refill requests, have your pharmacy contact our office and allow 72 hours.    Due to Covid, you will need to wear a mask upon  entering the hospital. If you do not have a mask, a mask will be given to you at the Main Entrance upon arrival. For doctor visits, patients may have 1 support person age 7 or older with them. For treatment visits, patients can not have anyone with them due to social distancing guidelines and our immunocompromised population.

## 2023-07-06 ENCOUNTER — Other Ambulatory Visit: Payer: Self-pay

## 2023-07-06 LAB — FOLLICLE STIMULATING HORMONE: FSH: 103 m[IU]/mL

## 2023-07-06 LAB — ESTRADIOL: Estradiol: <5 pg/mL

## 2023-07-06 LAB — LUTEINIZING HORMONE: LH: 54.8 m[IU]/mL

## 2023-07-07 ENCOUNTER — Telehealth: Payer: Self-pay

## 2023-07-07 NOTE — Telephone Encounter (Signed)
Attempted to leave voicemail to inform patient documents for [employer] had been completed and faxed to company. Fax conformation received. Copy of documents mailed to patient per request.

## 2023-07-11 ENCOUNTER — Other Ambulatory Visit: Payer: Self-pay

## 2023-07-11 MED ORDER — CIPROFLOXACIN HCL 500 MG PO TABS: 500.0000 mg | ORAL_TABLET | Freq: Two times a day (BID) | ORAL | 0 refills | Status: DC

## 2023-07-29 ENCOUNTER — Other Ambulatory Visit: Payer: Self-pay | Admitting: Hematology

## 2023-07-29 NOTE — Progress Notes (Signed)
Patient called reporting ongoing neuropathy. Patient reports no pain or burning with the neuropathy. Discussed that medications cannot reverse the neuropathy but should she develop pain or burning to call us back. Discussed that sometimes neuropathy can be permanent following chemotherapy treatment and asked that she continue to monitor symptoms and let us know of any changes. Patient verbalized understanding.

## 2023-08-01 ENCOUNTER — Encounter: Payer: Self-pay | Admitting: Hematology

## 2023-08-02 ENCOUNTER — Encounter: Payer: Self-pay | Admitting: Hematology

## 2023-08-09 ENCOUNTER — Encounter: Payer: Self-pay | Admitting: Hematology

## 2023-08-17 ENCOUNTER — Inpatient Hospital Stay: Payer: BC Managed Care – PPO

## 2023-08-17 ENCOUNTER — Inpatient Hospital Stay: Payer: BC Managed Care – PPO | Attending: Hematology | Admitting: Hematology

## 2023-08-17 ENCOUNTER — Encounter: Payer: Self-pay | Admitting: Hematology

## 2023-08-17 VITALS — BP 112/60 | HR 75 | Temp 97.9°F | Resp 18 | Wt 196.6 lb

## 2023-08-17 VITALS — BP 129/76 | HR 75 | Temp 98.4°F | Resp 20

## 2023-08-17 DIAGNOSIS — Z803 Family history of malignant neoplasm of breast: Secondary | ICD-10-CM | POA: Diagnosis not present

## 2023-08-17 DIAGNOSIS — G2581 Restless legs syndrome: Secondary | ICD-10-CM | POA: Diagnosis not present

## 2023-08-17 DIAGNOSIS — G629 Polyneuropathy, unspecified: Secondary | ICD-10-CM | POA: Diagnosis not present

## 2023-08-17 DIAGNOSIS — Z86711 Personal history of pulmonary embolism: Secondary | ICD-10-CM | POA: Insufficient documentation

## 2023-08-17 DIAGNOSIS — Z7901 Long term (current) use of anticoagulants: Secondary | ICD-10-CM | POA: Diagnosis not present

## 2023-08-17 DIAGNOSIS — Z170 Estrogen receptor positive status [ER+]: Secondary | ICD-10-CM | POA: Diagnosis not present

## 2023-08-17 DIAGNOSIS — Z79899 Other long term (current) drug therapy: Secondary | ICD-10-CM | POA: Diagnosis not present

## 2023-08-17 DIAGNOSIS — Z8042 Family history of malignant neoplasm of prostate: Secondary | ICD-10-CM | POA: Insufficient documentation

## 2023-08-17 DIAGNOSIS — Z8051 Family history of malignant neoplasm of kidney: Secondary | ICD-10-CM | POA: Diagnosis not present

## 2023-08-17 DIAGNOSIS — C50412 Malignant neoplasm of upper-outer quadrant of left female breast: Secondary | ICD-10-CM | POA: Insufficient documentation

## 2023-08-17 DIAGNOSIS — Z95828 Presence of other vascular implants and grafts: Secondary | ICD-10-CM

## 2023-08-17 DIAGNOSIS — E876 Hypokalemia: Secondary | ICD-10-CM | POA: Insufficient documentation

## 2023-08-17 DIAGNOSIS — Z801 Family history of malignant neoplasm of trachea, bronchus and lung: Secondary | ICD-10-CM | POA: Diagnosis not present

## 2023-08-17 LAB — CBC WITH DIFFERENTIAL/PLATELET
Abs Immature Granulocytes: 0.02 10*3/uL (ref 0.00–0.07)
Basophils Absolute: 0 10*3/uL (ref 0.0–0.1)
Basophils Relative: 0 %
Eosinophils Absolute: 0.3 10*3/uL (ref 0.0–0.5)
Eosinophils Relative: 3 %
HCT: 37 % (ref 36.0–46.0)
Hemoglobin: 12.2 g/dL (ref 12.0–15.0)
Immature Granulocytes: 0 %
Lymphocytes Relative: 17 %
Lymphs Abs: 1.5 10*3/uL (ref 0.7–4.0)
MCH: 31 pg (ref 26.0–34.0)
MCHC: 33 g/dL (ref 30.0–36.0)
MCV: 93.9 fL (ref 80.0–100.0)
Monocytes Absolute: 1 10*3/uL (ref 0.1–1.0)
Monocytes Relative: 11 %
Neutro Abs: 6 10*3/uL (ref 1.7–7.7)
Neutrophils Relative %: 69 %
Platelets: 236 10*3/uL (ref 150–400)
RBC: 3.94 MIL/uL (ref 3.87–5.11)
RDW: 12.8 % (ref 11.5–15.5)
WBC: 8.7 10*3/uL (ref 4.0–10.5)
nRBC: 0 % (ref 0.0–0.2)

## 2023-08-17 LAB — COMPREHENSIVE METABOLIC PANEL
ALT: 27 U/L (ref 0–44)
AST: 29 U/L (ref 15–41)
Albumin: 3.7 g/dL (ref 3.5–5.0)
Alkaline Phosphatase: 99 U/L (ref 38–126)
Anion gap: 8 (ref 5–15)
BUN: 10 mg/dL (ref 6–20)
CO2: 25 mmol/L (ref 22–32)
Calcium: 8.9 mg/dL (ref 8.9–10.3)
Chloride: 105 mmol/L (ref 98–111)
Creatinine, Ser: 0.51 mg/dL (ref 0.44–1.00)
GFR, Estimated: >60 mL/min (ref >60)
Glucose, Bld: 89 mg/dL (ref 70–99)
Potassium: 3.5 mmol/L (ref 3.5–5.1)
Sodium: 138 mmol/L (ref 135–145)
Total Bilirubin: 0.5 mg/dL (ref 0.3–1.2)
Total Protein: 7 g/dL (ref 6.5–8.1)

## 2023-08-17 LAB — MAGNESIUM: Magnesium: 1.9 mg/dL (ref 1.7–2.4)

## 2023-08-17 MED ORDER — SODIUM CHLORIDE 0.9% FLUSH
10.0000 mL | INTRAVENOUS | Status: DC | PRN
  Administered 2023-08-17: 10 mL via INTRAVENOUS

## 2023-08-17 MED ORDER — ANASTROZOLE 1 MG PO TABS
1.0000 mg | ORAL_TABLET | Freq: Every day | ORAL | 2 refills | Status: DC
Start: 2023-08-17 — End: 2024-05-22

## 2023-08-17 MED ORDER — HEPARIN SOD (PORK) LOCK FLUSH 100 UNIT/ML IV SOLN
500.0000 [IU] | Freq: Once | INTRAVENOUS | Status: AC
  Administered 2023-08-17: 500 [IU] via INTRAVENOUS

## 2023-08-17 NOTE — Progress Notes (Signed)
[facility] [address] [place] [address]    Clinic Day:  08/17/2023  Referring physician: [name], PA-C  Patient Care Team: [name], PA-C as PCP - General (Physician Assistant) [name], MD as Medical Oncologist (Medical Oncology)   ASSESSMENT & PLAN:   Assessment: 1.  Stage I (T1c N1 M0, G2, ER/PR+, HER2-) left breast UOQ IDC: - Screening mammogram (12/01/2022): Possible distortion in the left breast.  Right breast has no findings suspicious for malignancy. - Left breast diagnostic mammogram/US (12/14/2022): Irregular hypoechoic mass at the 1 o'clock position of the left breast, 4 cm from nipple, measuring 1 x 0.7 x 0.9 cm.  No pathologic lymphadenopathy in the left axilla. - Left breast biopsy (12/16/2022): Invasive ductal carcinoma, grade 2, ER/PR 100%, Ki-67 1%, HER2 0 by IHC. - Left breast lumpectomy and SLNB on 01/10/2023. - Pathology: 1.4 x 1.1 x 1.1 cm moderately differentiated IDC, grade 2, margins negative.  1 sentinel lymph node with metastatic carcinoma (1.1 mm).  1 lymph node with micrometastasis (1 mm).  3 other sentinel lymph nodes negative for invasive carcinoma.  PT1 cpN1a. - Oncotype DX recurrence score 14. - CT CAP (02/08/2023): Left Staebler 1.4 cm sclerotic lesion indeterminate.  No evidence of metastatic disease.  Right nephrolithiasis. - PET scan (02/10/2023): Postop changes in the left breast and axilla.  No uptake in the left established lesion. - 2D echo (02/16/2023): LVEF 55 to 60%. - Adjuvant chemotherapy: Dose dense AC and weekly paclitaxel from 02/22/2023 through 07/05/2023 - XRT from 08/02/2023 through 08/30/2023 - Anastrozole started on 09/02/2023   2.  Social/family history: - She lives at home with her husband.  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  No prior history of smoking. - Paternal grandmother had breast cancer.  Brother had kidney cancer.  Father had prostate cancer.   3.   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 - Heterozygosity for factor V Leiden mutation.  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  PT mutation negative.  Protein C, protein S, AT III normal. - She has been on Xarelto since then.   4.  Bone health: - DEXA scan (01/04/2023): T-score -0.3, normal.    Plan: 1.  Stage IB (T1c N1 M0, G2, ER/PR+, HER2-) left breast UOQ IDC: - XRT to the chest wall and breast started on 08/02/2023.  She has mild fatigue but otherwise she is tolerating well. - She had a history of NovaSure endometrial ablation and bilateral salpingectomy for pelvic cramping done on 11/08/2017.  She started having some hot flashes and mood changes even prior to ablation. - We discussed her estradiol, LH and FSH levels which are consistent with postmenopausal state. - We talked about initiating her on antiestrogen therapy with anastrozole for at least 5 years.  We discussed side effects including hot flashes, musculoskeletal symptoms, decreased bone mineral density among others. - She reports that she is having mild joint pains of last 1 month, mostly achiness in all the joints in the evenings.  No prior history of connective tissue disease.  No joint swellings. - She will start anastrozole after completion of radiation.  She is planning to go back to work in mid November. - RTC 4 months for follow-up.   2.  Peripheral neuropathy: - She has slight tingling in the fingertips and sensitivity.  Her toes feel cold.  She is  not taking gabapentin at this time.   3.  Restless leg syndrome: - Continue Requip twice daily.  Well-controlled.   4.  Hypokalemia: - Continue K-Dur 20 mill equivalents once every other day.  Potassium is 3.5.   5.  Bone health: - DEXA scan on 01/04/2023 with T-score -0.3. - Vitamin D level was 19.  She reports taking 2 tablets of vitamin D daily.  She does not  know the dose.  She will call us with the dose so we can adjust it.    Orders Placed This Encounter  Procedures   CBC with Differential/Platelet    Standing Status:   Future    Standing Expiration Date:   08/16/2024    Order Specific Question:   Release to patient    Answer:   Immediate   Comprehensive metabolic panel    Standing Status:   Future    Standing Expiration Date:   08/16/2024    Order Specific Question:   Release to patient    Answer:   Immediate   VITAMIN D 25 Hydroxy (Vit-D Deficiency, Fractures)    Standing Status:   Future    Standing Expiration Date:   08/16/2024    Order Specific Question:   Release to patient    Answer:   Immediate      I,Katie Daubenspeck,acting as a scribe for [name], MD.,have documented all relevant documentation on the behalf of [name], MD,as directed by  [name], MD while in the presence of [name], MD.   I, [name] MD, have reviewed the above documentation for accuracy and completeness, and I agree with the above.   [name], MD   10/16/20244:24 PM  CHIEF COMPLAINT:   Diagnosis: left breast cancer    Cancer Staging  Breast cancer of upper-outer quadrant of left female breast [facility]) Staging form: Breast, AJCC 8th Edition - Clinical stage from 12/23/2022: Stage IB (cT1c, cN1(sn), cM0, G2, ER+, PR+, HER2-) - Unsigned    Prior Therapy: 1. Lumpectomy and SLNB, 01/10/23  2. Dose dense AC, 4 cycles, 02/22/23 - 04/05/23  Current Therapy:  weekly paclitaxel    HISTORY OF PRESENT ILLNESS:   Oncology History  Breast cancer of upper-outer quadrant of left female breast (HCC)  12/23/2022 Initial Diagnosis   Breast cancer of upper-outer quadrant of left female breast   02/22/2023 -  Chemotherapy   Patient is on Treatment Plan : BREAST ADJUVANT DOSE DENSE AC q14d / PACLitaxel q7d        INTERVAL HISTORY:   Judy Patton is a 52 y.o. female presenting to clinic today for  follow up of left breast cancer. She was last seen by me on 07/05/23.  Since her last visit, she began radiation treatment under Dr. Langston Masker on 08/02/23 at [facility].  Today, she states that she is doing well overall. Her appetite level is at 100%. Her energy level is at 50%.  PAST MEDICAL HISTORY:   Past Medical History: Past Medical History:  Diagnosis Date   Anxiety    Chronic headaches    Depression    Factor V Leiden mutation (HCC)    Fatigue 02/18/2015   History of kidney stones    Hypothyroidism    MRSA (methicillin resistant Staphylococcus aureus)    Pain of tooth socket 03/14/2015   Pancreatitis    PONV (postoperative nausea and vomiting)    Port-A-Cath in place 02/14/2023   Pulmonary embolus (HCC)    Restless leg syndrome    Shingles rash  03/14/2015   Shoulder injury    Stress    family   Weight gain 02/18/2015    Surgical History: Past Surgical History:  Procedure Laterality Date   BREAST BIOPSY Left 12/16/2022   [place] LT BREAST BX W LOC DEV 1ST LESION IMG BX SPEC US GUIDE 12/16/2022 [facility]   BREAST BIOPSY Left 01/04/2023   [place] LT RADIO FREQUENCY TAG LOC US GUIDE 01/04/2023 Edwin Cap, MD [facility]   COLONOSCOPY  03/25/2004   VWU:JWJX canal hemorrhoids, otherwise normal rectum,   DILITATION & CURRETTAGE/HYSTROSCOPY WITH NOVASURE ABLATION N/A 11/08/2017   Procedure: DILATATION & CURETTAGE/HYSTEROSCOPY WITH NOVASURE ENDOMETRIAL ABLATION;  Surgeon: Tilda Burrow, MD;  Location: [facility];  Service: Gynecology;  Laterality: N/A;   ESOPHAGOGASTRODUODENOSCOPY  03/25/2004   BJY:NWGNFA esophagus, small hiatal hernia, otherwise normal stomach   kidney stones  05/2014,11/2014   LAPAROSCOPIC BILATERAL SALPINGECTOMY Bilateral 11/08/2017   Procedure: LAPAROSCOPIC BILATERAL SALPINGECTOMY;  Surgeon: Tilda Burrow, MD;  Location: [facility];  Service: Gynecology;  Laterality: Bilateral;   LITHOTRIPSY  2008   PARTIAL MASTECTOMY WITH AXILLARY SENTINEL LYMPH NODE BIOPSY Left  01/10/2023   Procedure: PARTIAL MASTECTOMY WITH AXILLARY SENTINEL LYMPH NODE BIOPSY AND RADIOFREQUENCY TAG PLACEMENT;  Surgeon: Lucretia Roers, MD;  Location: [facility];  Service: General;  Laterality: Left;   PORTACATH PLACEMENT Right 02/11/2023   Procedure: INSERTION PORT-A-CATH;  Surgeon: Lucretia Roers, MD;  Location: [facility];  Service: General;  Laterality: Right;    Social History: Social History   Socioeconomic History   Marital status: Married    Spouse name: Not on file   Number of children: Not on file   Years of education: Not on file   Highest education level: Not on file  Occupational History   Not on file  Tobacco Use   Smoking status: Never   Smokeless tobacco: Never  Vaping Use   Vaping status: Never Used  Substance and Sexual Activity   Alcohol use: Yes    Comment: occ   Drug use: No   Sexual activity: Not Currently    Birth control/protection: Surgical  Other Topics Concern   Not on file  Social History Narrative   Not on file   Social Determinants of Health   Financial Resource Strain: Low Risk  (07/07/2023)   Received from [facility]   Overall Financial Resource Strain (CARDIA)    Difficulty of Paying Living Expenses: Not hard at all  Food Insecurity: No Food Insecurity (07/07/2023)   Received from [facility]   Hunger Vital Sign    Worried About Running Out of Food in the Last Year: Never true    Ran Out of Food in the Last Year: Never true  Transportation Needs: No Transportation Needs (12/23/2022)   PRAPARE - Transportation    Lack of Transportation (Medical): No    Lack of Transportation (Non-Medical): No  Physical Activity: Insufficiently Active (07/07/2023)   Received from [facility]   Exercise Vital Sign    Days of Exercise per Week: 7 days    Minutes of Exercise per Session: 20 min  Stress: No Stress Concern Present (07/07/2023)   Received from [facility] - Occupational Stress  Questionnaire    Feeling of Stress : Not at all  Social Connections: Socially Integrated (07/07/2023)   Received from [facility]   Social Connection and Isolation Panel [NHANES]    Frequency of Communication with Friends and Family: More  than three times a week    Frequency of Social Gatherings with Friends and Family: More than three times a week    Attends Religious Services: More than 4 times per year    Active Member of [employer] or Organizations: Yes    Attends Engineer, structural: More than 4 times per year    Marital Status: Married  Catering manager Violence: Not At Risk (12/23/2022)   Humiliation, Afraid, Rape, and Kick questionnaire    Fear of Current or Ex-Partner: No    Emotionally Abused: No    Physically Abused: No    Sexually Abused: No    Family History: Family History  Problem Relation Age of Onset   Stroke Mother    CAD Mother    Prostate cancer Father 74   Kidney cancer Brother        dx 51s   Factor V Leiden deficiency Brother    Fibromyalgia Brother    Breast cancer Paternal Grandmother        dx >50   Lung cancer Paternal Grandfather     Current Medications:  Current Outpatient Medications:    acetaminophen (TYLENOL) 500 MG tablet, Take 1,000 mg by mouth every 6 (six) hours as needed for moderate pain or headache., Disp: , Rfl:    aluminum-magnesium hydroxide 200-200 MG/5ML suspension, Take 15 mLs by mouth 4 (four) times daily as needed for indigestion. Mix 1:1 with lidocaine and swish and swallow, Disp: 355 mL, Rfl: 0   anastrozole (ARIMIDEX) 1 MG tablet, Take 1 tablet (1 mg total) by mouth daily., Disp: 90 tablet, Rfl: 2   ciprofloxacin (CIPRO) 500 MG tablet, Take 1 tablet (500 mg total) by mouth 2 (two) times daily., Disp: 14 tablet, Rfl: 0   gabapentin (NEURONTIN) 300 MG capsule, Take 1 capsule (300 mg total) by mouth 2 (two) times daily., Disp: 60 capsule, Rfl: 2   ibuprofen (ADVIL,MOTRIN) 200 MG tablet, Take 400 mg by mouth every 6 (six)  hours as needed for headache or moderate pain., Disp: , Rfl:    KLOR-CON M20 20 MEQ tablet, TAKE 1 TABLET BY MOUTH EVERY DAY, Disp: 90 tablet, Rfl: 1   levothyroxine (SYNTHROID, LEVOTHROID) 88 MCG tablet, TAKE 88 MCG BY MOUTH IN THE MORNING, Disp: 90 tablet, Rfl: 3   lidocaine (XYLOCAINE) 2 % solution, Use as directed 15 mLs in the mouth or throat 4 (four) times daily as needed for mouth pain. Mix 1:1 with maalox and swish and swallow, Disp: 100 mL, Rfl: 0   lidocaine-prilocaine (EMLA) cream, Apply a quarter sized amount to port a cath site and cover with plastic wrap 1 hour prior to infusion appointments, Disp: 30 g, Rfl: 3   loratadine (CLARITIN) 10 MG tablet, Take 10 mg by mouth daily., Disp: , Rfl:    ondansetron (ZOFRAN) 4 MG tablet, Take 1 tablet (4 mg total) by mouth every 8 (eight) hours as needed., Disp: 30 tablet, Rfl: 1   oxyCODONE (ROXICODONE) 5 MG immediate release tablet, Take 1 tablet (5 mg total) by mouth every 4 (four) hours as needed for severe pain or breakthrough pain., Disp: 5 tablet, Rfl: 0   Pegfilgrastim-cbqv (UDENYCA [place]), Inject into the skin every 14 (fourteen) days., Disp: , Rfl:    Polyethylene Glycol 400 (VISINE DRY EYE RELIEF OP), Place 1 drop into both eyes daily as needed (dry eye)., Disp: , Rfl:    promethazine (PHENERGAN) 25 MG tablet, Take 1 tablet (25 mg total) by mouth every 6 (six) hours  as needed for nausea or vomiting., Disp: 30 tablet, Rfl: 3   rOPINIRole (REQUIP) 2 MG tablet, TAKE ONE TABLET BY MOUTH AT BEDTIME, Disp: 30 tablet, Rfl: 9   sucralfate (CARAFATE) 1 GM/10ML suspension, Take 10 mLs (1 g total) by mouth 4 (four) times daily -  with meals and at bedtime., Disp: 420 mL, Rfl: 0   TRINTELLIX 20 MG TABS tablet, Take 20 mg by mouth daily., Disp: , Rfl:    trolamine salicylate (ASPERCREME) 10 % cream, Apply 1 application topically as needed for muscle pain., Disp: , Rfl:    XARELTO 20 MG TABS tablet, , Disp: , Rfl: 2 No current facility-administered  medications for this visit.  Facility-Administered Medications Ordered in Other Visits:    sodium chloride flush (NS) 0.9 % injection 10 mL, 10 mL, Intravenous, PRN, [name], MD, 10 mL at 08/17/23 1425   Allergies: No Known Allergies  REVIEW OF SYSTEMS:   Review of Systems  Constitutional:  Negative for chills, fatigue and fever.  HENT:   Negative for lump/mass, mouth sores, nosebleeds, sore throat and trouble swallowing.   Eyes:  Negative for eye problems.  Respiratory:  Negative for cough and shortness of breath.   Cardiovascular:  Negative for chest pain, leg swelling and palpitations.  Gastrointestinal:  Negative for abdominal pain, constipation, diarrhea, nausea and vomiting.  Genitourinary:  Negative for bladder incontinence, difficulty urinating, dysuria, frequency, hematuria and nocturia.   Musculoskeletal:  Negative for arthralgias, back pain, flank pain, myalgias and neck pain.  Skin:  Negative for itching and rash.  Neurological:  Positive for headaches and numbness. Negative for dizziness.  Hematological:  Does not bruise/bleed easily.  Psychiatric/Behavioral:  Negative for depression, sleep disturbance and suicidal ideas. The patient is not nervous/anxious.   All other systems reviewed and are negative.    VITALS:   Blood pressure 112/60, pulse 75, temperature 97.9 F (36.6 C), temperature source Oral, resp. rate 18, weight 196 lb 9.6 oz (89.2 kg), SpO2 100%.  Wt Readings from Last 3 Encounters:  08/17/23 196 lb 9.6 oz (89.2 kg)  07/05/23 196 lb 12.8 oz (89.3 kg)  06/28/23 196 lb 6.4 oz (89.1 kg)    Body mass index is 32.72 kg/m.  Performance status (ECOG): 1 - Symptomatic but completely ambulatory  PHYSICAL EXAM:   Physical Exam Vitals and nursing note reviewed. Exam conducted with a chaperone present.  Constitutional:      Appearance: Normal appearance.  Cardiovascular:     Rate and Rhythm: Normal rate and regular rhythm.     Pulses: Normal  pulses.     Heart sounds: Normal heart sounds.  Pulmonary:     Effort: Pulmonary effort is normal.     Breath sounds: Normal breath sounds.  Abdominal:     Palpations: Abdomen is soft. There is no hepatomegaly, splenomegaly or mass.     Tenderness: There is no abdominal tenderness.  Musculoskeletal:     Right lower leg: No edema.     Left lower leg: No edema.  Lymphadenopathy:     Cervical: No cervical adenopathy.     Right cervical: No superficial, deep or posterior cervical adenopathy.    Left cervical: No superficial, deep or posterior cervical adenopathy.     Upper Body:     Right upper body: No supraclavicular or axillary adenopathy.     Left upper body: No supraclavicular or axillary adenopathy.  Neurological:     General: No focal deficit present.     Mental Status:  She is alert and oriented to person, place, and time.  Psychiatric:        Mood and Affect: Mood normal.        Behavior: Behavior normal.     LABS:      Latest Ref Rng & Units 08/17/2023    2:25 PM 07/05/2023    8:49 AM 06/28/2023    9:27 AM  CBC  WBC 4.0 - 10.5 K/uL 8.7  6.5  6.4   Hemoglobin 12.0 - 15.0 g/dL 28.4  13.2  44.0   Hematocrit 36.0 - 46.0 % 37.0  35.7  32.9   Platelets 150 - 400 K/uL 236  263  250       Latest Ref Rng & Units 08/17/2023    2:25 PM 07/05/2023    8:49 AM 06/28/2023    9:27 AM  CMP  Glucose 70 - 99 mg/dL 89  102  97   BUN 6 - 20 mg/dL 10  10  9    Creatinine 0.44 - 1.00 mg/dL 7.25  3.66  4.40   Sodium 135 - 145 mmol/L 138  137  136   Potassium 3.5 - 5.1 mmol/L 3.5  3.5  3.6   Chloride 98 - 111 mmol/L 105  103  104   CO2 22 - 32 mmol/L 25  23  24    Calcium 8.9 - 10.3 mg/dL 8.9  8.8  8.7   Total Protein 6.5 - 8.1 g/dL 7.0  6.7  6.4   Total Bilirubin 0.3 - 1.2 mg/dL 0.5  0.3  0.4   Alkaline Phos 38 - 126 U/L 99  94  94   AST 15 - 41 U/L 29  28  24    ALT 0 - 44 U/L 27  25  28       No results found for: "CEA1", "CEA" / No results found for: "CEA1", "CEA" No results  found for: "PSA1" No results found for: "HKV425" No results found for: "CAN125"  No results found for: "TOTALPROTELP", "ALBUMINELP", "A1GS", "A2GS", "BETS", "BETA2SER", "GAMS", "MSPIKE", "SPEI" Lab Results  Component Value Date   FERRITIN 110 11/13/2013   FERRITIN 81 08/07/2013   No results found for: "LDH"   STUDIES:   No results found.

## 2023-08-17 NOTE — Patient Instructions (Signed)
[facility]  Discharge Instructions  You were seen and examined today by Dr. Ellin Saba.  Dr. Ellin Saba discussed your most recent lab work which revealed that everything looks good and stable.  Start taking the Anastozole after you finish your radiation treatments. Call us with the units of the Vitamin D you are taking.  Follow-up as scheduled in 4 months.    Thank you for choosing [facility] to provide your oncology and hematology care.   To afford each patient quality time with our provider, please arrive at least 15 minutes before your scheduled appointment time. You may need to reschedule your appointment if you arrive late (10 or more minutes). Arriving late affects you and other patients whose appointments are after yours.  Also, if you miss three or more appointments without notifying the office, you may be dismissed from the clinic at the provider's discretion.    Again, thank you for choosing [facility].  Our hope is that these requests will decrease the amount of time that you wait before being seen by our physicians.   If you have a lab appointment with the Cancer Center - please note that after April 8th, all labs will be drawn in the cancer center.  You do not have to check in or register with the main entrance as you have in the past but will complete your check-in at the cancer center.            _____________________________________________________________  Should you have questions after your visit to [facility], please contact our office at [phone] and follow the prompts.  Our office hours are 8:00 a.m. to 4:30 p.m. Monday - Thursday and 8:00 a.m. to 2:30 p.m. Friday.  Please note that voicemails left after 4:00 p.m. may not be returned until the following business day.  We are closed weekends and all major holidays.  You do have access to a nurse 24-7, just call the main number to  the clinic [phone] and do not press any options, hold on the line and a nurse will answer the phone.    For prescription refill requests, have your pharmacy contact our office and allow 72 hours.    Masks are no longer required in the cancer centers. If you would like for your care team to wear a mask while they are taking care of you, please let them know. You may have one support person who is at least 52 years old accompany you for your appointments.

## 2023-08-17 NOTE — Progress Notes (Signed)
Patients port flushed without difficulty.  Good blood return noted with no bruising or swelling noted at site.  Band aid applied.  VSS with discharge and left in satisfactory condition with no s/s of distress noted.   

## 2023-08-18 ENCOUNTER — Other Ambulatory Visit: Payer: Self-pay | Admitting: *Deleted

## 2023-08-19 ENCOUNTER — Other Ambulatory Visit: Payer: Self-pay | Admitting: *Deleted

## 2023-08-19 ENCOUNTER — Other Ambulatory Visit: Payer: Self-pay

## 2023-08-19 NOTE — Telephone Encounter (Signed)
Vitamin D dose changed to 2,000 units daily per Dr. Ellin Saba.  Patient was notified and verbalized understanding.

## 2023-08-29 ENCOUNTER — Other Ambulatory Visit: Payer: Self-pay | Admitting: *Deleted

## 2023-08-30 ENCOUNTER — Encounter: Payer: Self-pay | Admitting: *Deleted

## 2023-09-07 ENCOUNTER — Telehealth: Payer: Self-pay | Admitting: *Deleted

## 2023-09-07 NOTE — Telephone Encounter (Signed)
Received Judy Patton's [place] 161 form signed by provider.  Successfully faxed to [employer].  Copy to [facility] bin designated for items to be scanned.  Copy mailed to address on file. [address] [place] [address] No further request received or actions performed by this nurse.

## 2023-09-12 ENCOUNTER — Other Ambulatory Visit: Payer: Self-pay

## 2023-10-31 ENCOUNTER — Encounter: Payer: Self-pay | Admitting: Hematology

## 2023-11-11 ENCOUNTER — Other Ambulatory Visit: Payer: Self-pay

## 2023-11-11 DIAGNOSIS — C50412 Malignant neoplasm of upper-outer quadrant of left female breast: Secondary | ICD-10-CM

## 2023-11-14 ENCOUNTER — Other Ambulatory Visit (HOSPITAL_COMMUNITY): Payer: Self-pay | Admitting: Hematology

## 2023-11-14 DIAGNOSIS — N63 Unspecified lump in unspecified breast: Secondary | ICD-10-CM

## 2023-11-23 ENCOUNTER — Other Ambulatory Visit: Payer: Self-pay | Admitting: Hematology

## 2023-11-24 ENCOUNTER — Encounter: Payer: Self-pay | Admitting: Hematology

## 2023-12-05 ENCOUNTER — Encounter: Payer: Self-pay | Admitting: Hematology

## 2023-12-12 ENCOUNTER — Ambulatory Visit: Payer: 59

## 2023-12-18 ENCOUNTER — Other Ambulatory Visit: Payer: Self-pay

## 2023-12-20 ENCOUNTER — Other Ambulatory Visit (HOSPITAL_COMMUNITY): Payer: Self-pay | Admitting: Hematology

## 2023-12-20 ENCOUNTER — Ambulatory Visit (HOSPITAL_COMMUNITY)
Admission: RE | Admit: 2023-12-20 | Discharge: 2023-12-20 | Disposition: A | Discharge status: Home / Self Care | Payer: 59 | Source: Ambulatory Visit | Attending: Hematology | Admitting: Hematology

## 2023-12-20 DIAGNOSIS — N63 Unspecified lump in unspecified breast: Secondary | ICD-10-CM

## 2023-12-20 DIAGNOSIS — R928 Other abnormal and inconclusive findings on diagnostic imaging of breast: Secondary | ICD-10-CM

## 2023-12-20 DIAGNOSIS — Z170 Estrogen receptor positive status [ER+]: Secondary | ICD-10-CM | POA: Insufficient documentation

## 2023-12-20 DIAGNOSIS — C50412 Malignant neoplasm of upper-outer quadrant of left female breast: Secondary | ICD-10-CM | POA: Insufficient documentation

## 2023-12-20 NOTE — Progress Notes (Incomplete)
[facility] [address] [place] [address]    Clinic Day:  12/20/2023  Referring physician: [name], PA-C  Patient Care Team: [name], PA-C as PCP - General (Physician Assistant) [name], MD as Medical Oncologist (Medical Oncology)   ASSESSMENT & PLAN:   Assessment: 1.  Stage I (T1c N1 M0, G2, ER/PR+, HER2-) left breast UOQ IDC: - Screening mammogram (12/01/2022): Possible distortion in the left breast.  Right breast has no findings suspicious for malignancy. - Left breast diagnostic mammogram/US (12/14/2022): Irregular hypoechoic mass at the 1 o'clock position of the left breast, 4 cm from nipple, measuring 1 x 0.7 x 0.9 cm.  No pathologic lymphadenopathy in the left axilla. - Left breast biopsy (12/16/2022): Invasive ductal carcinoma, grade 2, ER/PR 100%, Ki-67 1%, HER2 0 by IHC. - Left breast lumpectomy and SLNB on 01/10/2023. - Pathology: 1.4 x 1.1 x 1.1 cm moderately differentiated IDC, grade 2, margins negative.  1 sentinel lymph node with metastatic carcinoma (1.1 mm).  1 lymph node with micrometastasis (1 mm).  3 other sentinel lymph nodes negative for invasive carcinoma.  PT1 cpN1a. - Oncotype DX recurrence score 14. - CT CAP (02/08/2023): Left Staebler 1.4 cm sclerotic lesion indeterminate.  No evidence of metastatic disease.  Right nephrolithiasis. - PET scan (02/10/2023): Postop changes in the left breast and axilla.  No uptake in the left established lesion. - 2D echo (02/16/2023): LVEF 55 to 60%. - Adjuvant chemotherapy: Dose dense AC and weekly paclitaxel from 02/22/2023 through 07/05/2023 - XRT from 08/02/2023 through 08/30/2023 - Anastrozole started on 09/02/2023   2.  Social/family history: - She lives at home with her husband.  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  No prior history of smoking. - Paternal grandmother had breast cancer.  Brother had kidney cancer.  Father had prostate cancer.   3.   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 - Heterozygosity for factor V Leiden mutation.  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  PT mutation negative.  Protein C, protein S, AT III normal. - She has been on Xarelto since then.   4.  Bone health: - DEXA scan (01/04/2023): T-score -0.3, normal.    Plan: 1.  Stage IB (T1c N1 M0, G2, ER/PR+, HER2-) left breast UOQ IDC: - XRT to the chest wall and breast started on 08/02/2023.  She has mild fatigue but otherwise she is tolerating well. - She had a history of NovaSure endometrial ablation and bilateral salpingectomy for pelvic cramping done on 11/08/2017.  She started having some hot flashes and mood changes even prior to ablation. - We discussed her estradiol, LH and FSH levels which are consistent with postmenopausal state. - We talked about initiating her on antiestrogen therapy with anastrozole for at least 5 years.  We discussed side effects including hot flashes, musculoskeletal symptoms, decreased bone mineral density among others. - She reports that she is having mild joint pains of last 1 month, mostly achiness in all the joints in the evenings.  No prior history of connective tissue disease.  No joint swellings. - She will start anastrozole after completion of radiation.  She is planning to go back to work in mid November. - RTC 4 months for follow-up.   2.  Peripheral neuropathy: - She has slight tingling in the fingertips and sensitivity.  Her toes feel cold.  She is  not taking gabapentin at this time.   3.  Restless leg syndrome: - Continue Requip twice daily.  Well-controlled.   4.  Hypokalemia: - Continue K-Dur 20 mill equivalents once every other day.  Potassium is 3.5.   5.  Bone health: - DEXA scan on 01/04/2023 with T-score -0.3. - Vitamin D level was 19.  She reports taking 2 tablets of vitamin D daily.  She does not  know the dose.  She will call us with the dose so we can adjust it.    No orders of the defined types were placed in this encounter.     I,Katie Daubenspeck,acting as a Neurosurgeon for [name], MD.,have documented all relevant documentation on the behalf of [name], MD,as directed by  [name], MD while in the presence of [name], MD.   ***  Mickie Bail   2/18/20251:18 PM  CHIEF COMPLAINT:   Diagnosis: left breast cancer   Cancer Staging  Breast cancer of upper-outer quadrant of left female breast [facility]) Staging form: Breast, AJCC 8th Edition - Clinical stage from 12/23/2022: Stage IB (cT1c, cN1(sn), cM0, G2, ER+, PR+, HER2-) - Unsigned    Prior Therapy: 1. Lumpectomy and SLNB, 01/10/23  2. Dose dense AC, 4 cycles, 02/22/23 - 04/05/23 3. Weekly paclitaxel, 04/19/23 - 07/05/23 4. XRT to breast, 08/02/23 - 08/30/23  Current Therapy:  anastrozole   HISTORY OF PRESENT ILLNESS:   Oncology History  Breast cancer of upper-outer quadrant of left female breast (HCC)  12/23/2022 Initial Diagnosis   Breast cancer of upper-outer quadrant of left female breast   02/22/2023 -  Chemotherapy   Patient is on Treatment Plan : BREAST ADJUVANT DOSE DENSE AC q14d / PACLitaxel q7d        INTERVAL HISTORY:   Judy Patton is a 53 y.o. female presenting to clinic today for follow up of left breast cancer. She was last seen by me on 08/17/23.  Today, she states that she is doing well overall. Her appetite level is at ***%. Her energy level is at ***%.  PAST MEDICAL HISTORY:   Past Medical History: Past Medical History:  Diagnosis Date   Anxiety    Chronic headaches    Depression    Factor V Leiden mutation (HCC)    Fatigue 02/18/2015   History of kidney stones    Hypothyroidism    MRSA (methicillin resistant Staphylococcus aureus)    Pain of tooth socket 03/14/2015   Pancreatitis    PONV (postoperative nausea and vomiting)    Port-A-Cath in place  02/14/2023   Pulmonary embolus (HCC)    Restless leg syndrome    Shingles rash 03/14/2015   Shoulder injury    Stress    family   Weight gain 02/18/2015    Surgical History: Past Surgical History:  Procedure Laterality Date   BREAST BIOPSY Left 12/16/2022   [place] LT BREAST BX W LOC DEV 1ST LESION IMG BX SPEC US GUIDE 12/16/2022 [facility]   BREAST BIOPSY Left 01/04/2023   [place] LT RADIO FREQUENCY TAG LOC US GUIDE 01/04/2023 Edwin Cap, MD [facility]   COLONOSCOPY  03/25/2004   ZOX:WRUE canal hemorrhoids, otherwise normal rectum,   DILITATION & CURRETTAGE/HYSTROSCOPY WITH NOVASURE ABLATION N/A 11/08/2017   Procedure: DILATATION & CURETTAGE/HYSTEROSCOPY WITH NOVASURE ENDOMETRIAL ABLATION;  Surgeon: Tilda Burrow, MD;  Location: [facility];  Service: Gynecology;  Laterality: N/A;   ESOPHAGOGASTRODUODENOSCOPY  03/25/2004   AVW:UJWJXB esophagus, small hiatal hernia, otherwise normal stomach   kidney stones  05/2014,11/2014  LAPAROSCOPIC BILATERAL SALPINGECTOMY Bilateral 11/08/2017   Procedure: LAPAROSCOPIC BILATERAL SALPINGECTOMY;  Surgeon: Tilda Burrow, MD;  Location: [facility];  Service: Gynecology;  Laterality: Bilateral;   LITHOTRIPSY  2008   PARTIAL MASTECTOMY WITH AXILLARY SENTINEL LYMPH NODE BIOPSY Left 01/10/2023   Procedure: PARTIAL MASTECTOMY WITH AXILLARY SENTINEL LYMPH NODE BIOPSY AND RADIOFREQUENCY TAG PLACEMENT;  Surgeon: Lucretia Roers, MD;  Location: [facility];  Service: General;  Laterality: Left;   PORTACATH PLACEMENT Right 02/11/2023   Procedure: INSERTION PORT-A-CATH;  Surgeon: Lucretia Roers, MD;  Location: [facility];  Service: General;  Laterality: Right;    Social History: Social History   Socioeconomic History   Marital status: Married    Spouse name: Not on file   Number of children: Not on file   Years of education: Not on file   Highest education level: Not on file  Occupational History   Not on file  Tobacco Use   Smoking status: Never   Smokeless  tobacco: Never  Vaping Use   Vaping status: Never Used  Substance and Sexual Activity   Alcohol use: Yes    Comment: occ   Drug use: No   Sexual activity: Not Currently    Birth control/protection: Surgical  Other Topics Concern   Not on file  Social History Narrative   Not on file   Social Drivers of Health   Financial Resource Strain: Low Risk  (10/13/2023)   Received from [facility]   Overall Financial Resource Strain (CARDIA)    Difficulty of Paying Living Expenses: Not hard at all  Food Insecurity: No Food Insecurity (10/13/2023)   Received from [facility]   Hunger Vital Sign    Worried About Running Out of Food in the Last Year: Never true    Ran Out of Food in the Last Year: Never true  Transportation Needs: No Transportation Needs (10/13/2023)   Received from [facility] - Transportation    Lack of Transportation (Medical): No    Lack of Transportation (Non-Medical): No  Physical Activity: Insufficiently Active (07/07/2023)   Received from [facility]   Exercise Vital Sign    Days of Exercise per Week: 7 days    Minutes of Exercise per Session: 20 min  Stress: No Stress Concern Present (07/07/2023)   Received from [facility] - Occupational Stress Questionnaire    Feeling of Stress : Not at all  Social Connections: Socially Integrated (07/07/2023)   Received from [facility]   Social Connection and Isolation Panel [NHANES]    Frequency of Communication with Friends and Family: More than three times a week    Frequency of Social Gatherings with Friends and Family: More than three times a week    Attends Religious Services: More than 4 times per year    Active Member of [employer] or Organizations: Yes    Attends Banker Meetings: More than 4 times per year    Marital Status: Married  Catering manager Violence: Not At Risk (12/23/2022)   Humiliation, Afraid, Rape, and Kick questionnaire     Fear of Current or Ex-Partner: No    Emotionally Abused: No    Physically Abused: No    Sexually Abused: No    Family History: Family History  Problem Relation Age of Onset   Stroke Mother    CAD Mother    Prostate cancer Father 79   Kidney cancer Brother  dx 71s   Factor V Leiden deficiency Brother    Fibromyalgia Brother    Breast cancer Paternal Grandmother        dx >50   Lung cancer Paternal Grandfather     Current Medications:  Current Outpatient Medications:    acetaminophen (TYLENOL) 500 MG tablet, Take 1,000 mg by mouth every 6 (six) hours as needed for moderate pain or headache., Disp: , Rfl:    aluminum-magnesium hydroxide 200-200 MG/5ML suspension, Take 15 mLs by mouth 4 (four) times daily as needed for indigestion. Mix 1:1 with lidocaine and swish and swallow, Disp: 355 mL, Rfl: 0   anastrozole (ARIMIDEX) 1 MG tablet, Take 1 tablet (1 mg total) by mouth daily., Disp: 90 tablet, Rfl: 2   Cholecalciferol (VITAMIN D3) 50 MCG (2000 UT) CAPS, Take 1 capsule by mouth daily., Disp: , Rfl:    gabapentin (NEURONTIN) 300 MG capsule, TAKE 1 CAPSULE BY MOUTH TWICE A DAY, Disp: 60 capsule, Rfl: 2   ibuprofen (ADVIL,MOTRIN) 200 MG tablet, Take 400 mg by mouth every 6 (six) hours as needed for headache or moderate pain., Disp: , Rfl:    KLOR-CON M20 20 MEQ tablet, TAKE 1 TABLET BY MOUTH EVERY DAY, Disp: 90 tablet, Rfl: 1   levothyroxine (SYNTHROID, LEVOTHROID) 88 MCG tablet, TAKE 88 MCG BY MOUTH IN THE MORNING, Disp: 90 tablet, Rfl: 3   lidocaine (XYLOCAINE) 2 % solution, Use as directed 15 mLs in the mouth or throat 4 (four) times daily as needed for mouth pain. Mix 1:1 with maalox and swish and swallow, Disp: 100 mL, Rfl: 0   lidocaine-prilocaine (EMLA) cream, Apply a quarter sized amount to port a cath site and cover with plastic wrap 1 hour prior to infusion appointments, Disp: 30 g, Rfl: 3   loratadine (CLARITIN) 10 MG tablet, Take 10 mg by mouth daily., Disp: , Rfl:     ondansetron (ZOFRAN) 4 MG tablet, Take 1 tablet (4 mg total) by mouth every 8 (eight) hours as needed., Disp: 30 tablet, Rfl: 1   oxyCODONE (ROXICODONE) 5 MG immediate release tablet, Take 1 tablet (5 mg total) by mouth every 4 (four) hours as needed for severe pain or breakthrough pain., Disp: 5 tablet, Rfl: 0   Polyethylene Glycol 400 (VISINE DRY EYE RELIEF OP), Place 1 drop into both eyes daily as needed (dry eye)., Disp: , Rfl:    promethazine (PHENERGAN) 25 MG tablet, Take 1 tablet (25 mg total) by mouth every 6 (six) hours as needed for nausea or vomiting., Disp: 30 tablet, Rfl: 3   rOPINIRole (REQUIP) 2 MG tablet, TAKE ONE TABLET BY MOUTH AT BEDTIME, Disp: 30 tablet, Rfl: 9   sucralfate (CARAFATE) 1 GM/10ML suspension, Take 10 mLs (1 g total) by mouth 4 (four) times daily -  with meals and at bedtime., Disp: 420 mL, Rfl: 0   TRINTELLIX 20 MG TABS tablet, Take 20 mg by mouth daily., Disp: , Rfl:    trolamine salicylate (ASPERCREME) 10 % cream, Apply 1 application topically as needed for muscle pain., Disp: , Rfl:    XARELTO 20 MG TABS tablet, , Disp: , Rfl: 2   Allergies: No Known Allergies  REVIEW OF SYSTEMS:   Review of Systems  Constitutional:  Negative for chills, fatigue and fever.  HENT:   Negative for lump/mass, mouth sores, nosebleeds, sore throat and trouble swallowing.   Eyes:  Negative for eye problems.  Respiratory:  Negative for cough and shortness of breath.   Cardiovascular:  Negative  for chest pain, leg swelling and palpitations.  Gastrointestinal:  Negative for abdominal pain, constipation, diarrhea, nausea and vomiting.  Genitourinary:  Negative for bladder incontinence, difficulty urinating, dysuria, frequency, hematuria and nocturia.   Musculoskeletal:  Negative for arthralgias, back pain, flank pain, myalgias and neck pain.  Skin:  Negative for itching and rash.  Neurological:  Negative for dizziness, headaches and numbness.  Hematological:  Does not bruise/bleed  easily.  Psychiatric/Behavioral:  Negative for depression, sleep disturbance and suicidal ideas. The patient is not nervous/anxious.   All other systems reviewed and are negative.    VITALS:   There were no vitals taken for this visit.  Wt Readings from Last 3 Encounters:  08/17/23 196 lb 9.6 oz (89.2 kg)  07/05/23 196 lb 12.8 oz (89.3 kg)  06/28/23 196 lb 6.4 oz (89.1 kg)    There is no height or weight on file to calculate BMI.  Performance status (ECOG): {CHL ONC [id number]  PHYSICAL EXAM:   Physical Exam Vitals and nursing note reviewed. Exam conducted with a chaperone present.  Constitutional:      Appearance: Normal appearance.  Cardiovascular:     Rate and Rhythm: Normal rate and regular rhythm.     Pulses: Normal pulses.     Heart sounds: Normal heart sounds.  Pulmonary:     Effort: Pulmonary effort is normal.     Breath sounds: Normal breath sounds.  Abdominal:     Palpations: Abdomen is soft. There is no hepatomegaly, splenomegaly or mass.     Tenderness: There is no abdominal tenderness.  Musculoskeletal:     Right lower leg: No edema.     Left lower leg: No edema.  Lymphadenopathy:     Cervical: No cervical adenopathy.     Right cervical: No superficial, deep or posterior cervical adenopathy.    Left cervical: No superficial, deep or posterior cervical adenopathy.     Upper Body:     Right upper body: No supraclavicular or axillary adenopathy.     Left upper body: No supraclavicular or axillary adenopathy.  Neurological:     General: No focal deficit present.     Mental Status: She is alert and oriented to person, place, and time.  Psychiatric:        Mood and Affect: Mood normal.        Behavior: Behavior normal.     LABS:   CBC     Component Value Date/Time   WBC 8.7 08/17/2023 1425   RBC 3.94 08/17/2023 1425   HGB 12.2 08/17/2023 1425   HCT 37.0 08/17/2023 1425   PLT 236 08/17/2023 1425   MCV 93.9 08/17/2023 1425   MCH 31.0 08/17/2023  1425   MCHC 33.0 08/17/2023 1425   RDW 12.8 08/17/2023 1425   RDW 13.4 02/18/2015 1527   LYMPHSABS 1.5 08/17/2023 1425   MONOABS 1.0 08/17/2023 1425   EOSABS 0.3 08/17/2023 1425   BASOSABS 0.0 08/17/2023 1425    CMP      Component Value Date/Time   NA 138 08/17/2023 1425   NA 141 02/18/2015 1527   K 3.5 08/17/2023 1425   CL 105 08/17/2023 1425   CO2 25 08/17/2023 1425   GLUCOSE 89 08/17/2023 1425   BUN 10 08/17/2023 1425   BUN 9 02/18/2015 1527   CREATININE 0.51 08/17/2023 1425   CREATININE 0.53 12/26/2013 1434   CALCIUM 8.9 08/17/2023 1425   PROT 7.0 08/17/2023 1425   PROT 6.8 02/18/2015 1527   ALBUMIN 3.7 08/17/2023  1425   ALBUMIN 3.7 02/18/2015 1527   AST 29 08/17/2023 1425   ALT 27 08/17/2023 1425   ALKPHOS 99 08/17/2023 1425   BILITOT 0.5 08/17/2023 1425   BILITOT 0.2 02/18/2015 1527   GFRNONAA >60 08/17/2023 1425   GFRAA >60 11/02/2017 1453     No results found for: "CEA1", "CEA" / No results found for: "CEA1", "CEA" No results found for: "PSA1" No results found for: "UJW119" No results found for: "CAN125"  No results found for: "TOTALPROTELP", "ALBUMINELP", "A1GS", "A2GS", "BETS", "BETA2SER", "GAMS", "MSPIKE", "SPEI" Lab Results  Component Value Date   FERRITIN 110 11/13/2013   FERRITIN 81 08/07/2013   No results found for: "LDH"   STUDIES:   No results found.

## 2023-12-21 ENCOUNTER — Inpatient Hospital Stay: Payer: BC Managed Care – PPO

## 2023-12-21 ENCOUNTER — Inpatient Hospital Stay: Payer: BC Managed Care – PPO | Admitting: Hematology

## 2023-12-21 ENCOUNTER — Other Ambulatory Visit: Payer: Self-pay

## 2023-12-23 ENCOUNTER — Other Ambulatory Visit: Payer: Self-pay

## 2023-12-27 ENCOUNTER — Inpatient Hospital Stay: Payer: 59 | Admitting: Hematology

## 2023-12-27 ENCOUNTER — Inpatient Hospital Stay: Payer: 59

## 2023-12-28 ENCOUNTER — Encounter (HOSPITAL_COMMUNITY): Payer: Self-pay | Admitting: Hematology

## 2023-12-28 ENCOUNTER — Other Ambulatory Visit (HOSPITAL_COMMUNITY): Payer: Self-pay | Admitting: Hematology

## 2023-12-28 DIAGNOSIS — R928 Other abnormal and inconclusive findings on diagnostic imaging of breast: Secondary | ICD-10-CM

## 2024-01-03 ENCOUNTER — Inpatient Hospital Stay: Payer: 59 | Attending: Hematology

## 2024-01-03 ENCOUNTER — Encounter (HOSPITAL_COMMUNITY): Payer: Self-pay

## 2024-01-03 ENCOUNTER — Ambulatory Visit (HOSPITAL_COMMUNITY)
Admission: RE | Admit: 2024-01-03 | Discharge: 2024-01-03 | Disposition: A | Discharge status: Home / Self Care | Payer: 59 | Source: Ambulatory Visit | Attending: Hematology | Admitting: Hematology

## 2024-01-03 DIAGNOSIS — Z8051 Family history of malignant neoplasm of kidney: Secondary | ICD-10-CM | POA: Diagnosis not present

## 2024-01-03 DIAGNOSIS — Z7901 Long term (current) use of anticoagulants: Secondary | ICD-10-CM | POA: Diagnosis not present

## 2024-01-03 DIAGNOSIS — Z170 Estrogen receptor positive status [ER+]: Secondary | ICD-10-CM | POA: Insufficient documentation

## 2024-01-03 DIAGNOSIS — G2581 Restless legs syndrome: Secondary | ICD-10-CM | POA: Insufficient documentation

## 2024-01-03 DIAGNOSIS — G629 Polyneuropathy, unspecified: Secondary | ICD-10-CM | POA: Insufficient documentation

## 2024-01-03 DIAGNOSIS — Z79811 Long term (current) use of aromatase inhibitors: Secondary | ICD-10-CM | POA: Insufficient documentation

## 2024-01-03 DIAGNOSIS — Z803 Family history of malignant neoplasm of breast: Secondary | ICD-10-CM | POA: Diagnosis not present

## 2024-01-03 DIAGNOSIS — Z79899 Other long term (current) drug therapy: Secondary | ICD-10-CM | POA: Diagnosis not present

## 2024-01-03 DIAGNOSIS — E876 Hypokalemia: Secondary | ICD-10-CM | POA: Diagnosis not present

## 2024-01-03 DIAGNOSIS — Z923 Personal history of irradiation: Secondary | ICD-10-CM | POA: Diagnosis not present

## 2024-01-03 DIAGNOSIS — Z8042 Family history of malignant neoplasm of prostate: Secondary | ICD-10-CM | POA: Diagnosis not present

## 2024-01-03 DIAGNOSIS — Z801 Family history of malignant neoplasm of trachea, bronchus and lung: Secondary | ICD-10-CM | POA: Diagnosis not present

## 2024-01-03 DIAGNOSIS — R928 Other abnormal and inconclusive findings on diagnostic imaging of breast: Secondary | ICD-10-CM

## 2024-01-03 DIAGNOSIS — R232 Flushing: Secondary | ICD-10-CM | POA: Insufficient documentation

## 2024-01-03 DIAGNOSIS — Z86711 Personal history of pulmonary embolism: Secondary | ICD-10-CM | POA: Diagnosis not present

## 2024-01-03 DIAGNOSIS — N951 Menopausal and female climacteric states: Secondary | ICD-10-CM | POA: Diagnosis not present

## 2024-01-03 DIAGNOSIS — C50412 Malignant neoplasm of upper-outer quadrant of left female breast: Secondary | ICD-10-CM | POA: Diagnosis present

## 2024-01-03 DIAGNOSIS — Z9221 Personal history of antineoplastic chemotherapy: Secondary | ICD-10-CM | POA: Diagnosis not present

## 2024-01-03 LAB — COMPREHENSIVE METABOLIC PANEL
ALT: 23 U/L (ref 0–44)
AST: 25 U/L (ref 15–41)
Albumin: 3.7 g/dL (ref 3.5–5.0)
Alkaline Phosphatase: 96 U/L (ref 38–126)
Anion gap: 10 (ref 5–15)
BUN: 10 mg/dL (ref 6–20)
CO2: 24 mmol/L (ref 22–32)
Calcium: 9.1 mg/dL (ref 8.9–10.3)
Chloride: 103 mmol/L (ref 98–111)
Creatinine, Ser: 0.48 mg/dL (ref 0.44–1.00)
GFR, Estimated: >60 mL/min (ref >60)
Glucose, Bld: 94 mg/dL (ref 70–99)
Potassium: 3.6 mmol/L (ref 3.5–5.1)
Sodium: 137 mmol/L (ref 135–145)
Total Bilirubin: 0.5 mg/dL (ref 0.0–1.2)
Total Protein: 7 g/dL (ref 6.5–8.1)

## 2024-01-03 LAB — CBC WITH DIFFERENTIAL/PLATELET
Abs Immature Granulocytes: 0.02 10*3/uL (ref 0.00–0.07)
Basophils Absolute: 0 10*3/uL (ref 0.0–0.1)
Basophils Relative: 1 %
Eosinophils Absolute: 0.2 10*3/uL (ref 0.0–0.5)
Eosinophils Relative: 3 %
HCT: 39.1 % (ref 36.0–46.0)
Hemoglobin: 12.7 g/dL (ref 12.0–15.0)
Immature Granulocytes: 0 %
Lymphocytes Relative: 22 %
Lymphs Abs: 1.5 10*3/uL (ref 0.7–4.0)
MCH: 30 pg (ref 26.0–34.0)
MCHC: 32.5 g/dL (ref 30.0–36.0)
MCV: 92.2 fL (ref 80.0–100.0)
Monocytes Absolute: 0.8 10*3/uL (ref 0.1–1.0)
Monocytes Relative: 12 %
Neutro Abs: 4 10*3/uL (ref 1.7–7.7)
Neutrophils Relative %: 62 %
Platelets: 218 10*3/uL (ref 150–400)
RBC: 4.24 MIL/uL (ref 3.87–5.11)
RDW: 13.6 % (ref 11.5–15.5)
WBC: 6.5 10*3/uL (ref 4.0–10.5)
nRBC: 0 % (ref 0.0–0.2)

## 2024-01-03 LAB — VITAMIN D 25 HYDROXY (VIT D DEFICIENCY, FRACTURES): Vit D, 25-Hydroxy: 40.73 ng/mL (ref 30–100)

## 2024-01-03 MED ORDER — HEPARIN SOD (PORK) LOCK FLUSH 100 UNIT/ML IV SOLN
500.0000 [IU] | Freq: Once | INTRAVENOUS | Status: AC
  Administered 2024-01-03: 500 [IU] via INTRAVENOUS

## 2024-01-03 MED ORDER — SODIUM CHLORIDE 0.9% FLUSH
10.0000 mL | INTRAVENOUS | Status: DC | PRN
  Administered 2024-01-03: 10 mL via INTRAVENOUS

## 2024-01-03 MED ORDER — LIDOCAINE HCL (PF) 2 % IJ SOLN
10.0000 mL | Freq: Once | INTRAMUSCULAR | Status: AC
  Administered 2024-01-03: 10 mL

## 2024-01-03 MED ORDER — LIDOCAINE HCL (PF) 2 % IJ SOLN
INTRAMUSCULAR | Status: AC
  Filled 2024-01-03: qty 10

## 2024-01-03 MED ORDER — LIDOCAINE-EPINEPHRINE (PF) 1 %-1:200000 IJ SOLN
10.0000 mL | Freq: Once | INTRAMUSCULAR | Status: AC
  Administered 2024-01-03: 10 mL via INTRADERMAL

## 2024-01-03 NOTE — Progress Notes (Signed)
 PT tolerated Left breast biopsy well today with NAD noted. PT verbalized understanding of discharge instructions. PT ambulated back to the mammogram area this time and given ice packs. Specimens taken to lab for processing by Colette from ultrasound.

## 2024-01-05 LAB — SURGICAL PATHOLOGY

## 2024-01-10 ENCOUNTER — Inpatient Hospital Stay (HOSPITAL_BASED_OUTPATIENT_CLINIC_OR_DEPARTMENT_OTHER): Payer: 59 | Admitting: Hematology

## 2024-01-10 VITALS — BP 134/77 | HR 85 | Temp 98.2°F | Resp 16 | Wt 198.0 lb

## 2024-01-10 DIAGNOSIS — C50412 Malignant neoplasm of upper-outer quadrant of left female breast: Secondary | ICD-10-CM

## 2024-01-10 DIAGNOSIS — Z170 Estrogen receptor positive status [ER+]: Secondary | ICD-10-CM | POA: Diagnosis not present

## 2024-01-10 NOTE — Progress Notes (Signed)
 [facility] [address] [place] [address]    Clinic Day:  01/10/2024  Referring physician: [name], PA-C  Patient Care Team: Judy Negus, PA-C as PCP - General (Physician Assistant) Judy Massed, MD as Medical Oncologist (Medical Oncology)   ASSESSMENT & PLAN:   Assessment: 1.  Stage I (T1c N1 M0, G2, ER/PR+, HER2-) left breast UOQ IDC: - Screening mammogram (12/01/2022): Possible distortion in the left breast.  Right breast has no findings suspicious for malignancy. - Left breast diagnostic mammogram/US (12/14/2022): Irregular hypoechoic mass at the 1 o'clock position of the left breast, 4 cm from nipple, measuring 1 x 0.7 x 0.9 cm.  No pathologic lymphadenopathy in the left axilla. - Left breast biopsy (12/16/2022): Invasive ductal carcinoma, grade 2, ER/PR 100%, Ki-67 1%, HER2 0 by IHC. - Left breast lumpectomy and SLNB on 01/10/2023. - Pathology: 1.4 x 1.1 x 1.1 cm moderately differentiated IDC, grade 2, margins negative.  1 sentinel lymph node with metastatic carcinoma (1.1 mm).  1 lymph node with micrometastasis (1 mm).  3 other sentinel lymph nodes negative for invasive carcinoma.  PT1 cpN1a. - Oncotype DX recurrence score 14. - CT CAP (02/08/2023): Left Staebler 1.4 cm sclerotic lesion indeterminate.  No evidence of metastatic disease.  Right nephrolithiasis. - PET scan (02/10/2023): Postop changes in the left breast and axilla.  No uptake in the left established lesion. - 2D echo (02/16/2023): LVEF 55 to 60%. - Adjuvant chemotherapy: Dose dense AC and weekly paclitaxel from 02/22/2023 through 07/05/2023 - XRT from 08/02/2023 through 08/30/2023 - She had a history of NovaSure endometrial ablation and bilateral salpingectomy for pelvic cramping done on 11/08/2017.  She started having hot flashes and mood changes even prior to radiation. - Estradiol, LH and FSH levels consistent with postmenopausal state. - Anastrozole started on 09/02/2023   2.   Social/family history: - She lives at home with her husband.  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  No prior history of smoking. - Paternal grandmother had breast cancer.  Brother had kidney cancer.  Father had prostate cancer.   3.  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 - Heterozygosity for factor V Leiden mutation.  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  PT mutation negative.  Protein C, protein S, AT III normal. - She has been on Xarelto since then.   4.  Bone health: - DEXA scan (01/04/2023): T-score -0.3, normal.    Plan: 1.  Stage IB (T1c N1 M0, G2, ER/PR+, HER2-) left breast UOQ IDC: - She is tolerating anastrozole reasonably well. - She has slight worsening of joint pains at nighttime.  She has joint pains particularly in the knees prior to start of anastrozole.  Now she has joint pains all over.  She also has hot flashes both during day and night. - Reviewed labs: Normal LFTs and CBC. - Reviewed diagnostic mammogram from 12/20/2023: Indeterminate 1.1 cm mass in the retroareolar left breast.  The patient reportedly felt this mass. - We reviewed pathology report of the biopsy from 01/03/2024: Dense fibrotic tissue with extensive hemosiderin deposition with focal attenuated ductal epithelium suggestive of cyst/dilated duct and rare benign breast elements. - Will closely monitor this.  Continue anastrozole.  RTC 6 months for follow-up.  She will also be considered to add [facility] agonist should she start menstruating again.   2.  Peripheral neuropathy: -  She has left arm numbness at nighttime since surgery.  Her left hand also tingles mostly at nighttime.   3.  Restless leg syndrome: - Continue Requip twice daily.  This is well-controlled.   4.  Hypokalemia: - She is taking K-Dur 20 mill equivalents once a week.  Potassium is 3.6.   5.  Bone health  (DEXA 01/04/2023 T-score -0.3): - Continue vitamin D daily.  Vitamin D level is normal at 40.    Orders Placed This Encounter  Procedures   CBC with Differential    Standing Status:   Future    Expected Date:   07/09/2024    Expiration Date:   01/09/2025   Comprehensive metabolic panel    Standing Status:   Future    Expected Date:   07/09/2024    Expiration Date:   01/09/2025      Judy Patton,acting as a scribe for Judy Massed, MD.,have documented all relevant documentation on the behalf of Judy Massed, MD,as directed by  Judy Massed, MD while in the presence of Judy Massed, MD.   I, Judy Massed MD, have reviewed the above documentation for accuracy and completeness, and I agree with the above.    Judy Massed, MD   3/11/20254:55 PM  CHIEF COMPLAINT:   Diagnosis: left breast cancer    Cancer Staging  Breast cancer of upper-outer quadrant of left female breast [facility]) Staging form: Breast, AJCC 8th Edition - Clinical stage from 12/23/2022: Stage IB (cT1c, cN1(sn), cM0, G2, ER+, PR+, HER2-) - Unsigned    Prior Therapy: 1. Lumpectomy and SLNB, 01/10/23  2. Dose dense AC, 4 cycles, 02/22/23 - 04/05/23  Current Therapy:  weekly paclitaxel    HISTORY OF PRESENT ILLNESS:   Oncology History  Breast cancer of upper-outer quadrant of left female breast (HCC)  12/23/2022 Initial Diagnosis   Breast cancer of upper-outer quadrant of left female breast   02/22/2023 -  Chemotherapy   Patient is on Treatment Plan : BREAST ADJUVANT DOSE DENSE AC q14d / PACLitaxel q7d        INTERVAL HISTORY:   Judy Patton is a 53 y.o. female presenting to clinic today for follow up of left breast cancer. She was last seen by me on 08/17/23.  Since her last visit, she underwent bilateral diagnostic mammogram on 12/20/23 that found: Indeterminate 1.1 cm mass in the retroareolar left breast. Judy Patton then had left atroareolar breast mass biopsy on 01/03/23 with  pathology revealing: dense fibrotic tissue with extensive hemosiderin deposition with focal attenuated ductal epithelium suggestive of a cyst/dilated duct and rare benign breast elements.    Today, she states that she is doing well overall. Her appetite level is at 100%. Her energy level is at 75%. She is accompanied by her husband.  Judy Patton is tolerating Anastrozole, though she reports arthralgias throughout the body at night. Pain improves when she is active. She also notes hot flashes, occurring both during the night and day, without impacting daily activities. She denies any vaginal spotting or bleeding.   Judy Patton states she has left arm and hand numbness and tingling. She denies any inflammation or pain to the area. Left arm numbness had an onset beginning after radiation and surgery. She also notes occasional left shoulder pain with the same onset time. [place] has no neuropathy in the lower extremities, though she does endorse mild tingling in the right hand fingertips.   She is taking Vitamin D 4,000 units daily and potassium once a week. Restless leg syndrome is  stable.   PAST MEDICAL HISTORY:   Past Medical History: Past Medical History:  Diagnosis Date   Anxiety    Chronic headaches    Depression    Factor V Leiden mutation (HCC)    Fatigue 02/18/2015   History of kidney stones    Hypothyroidism    MRSA (methicillin resistant Staphylococcus aureus)    Pain of tooth socket 03/14/2015   Pancreatitis    PONV (postoperative nausea and vomiting)    Port-A-Cath in place 02/14/2023   Pulmonary embolus (HCC)    Restless leg syndrome    Shingles rash 03/14/2015   Shoulder injury    Stress    family   Weight gain 02/18/2015    Surgical History: Past Surgical History:  Procedure Laterality Date   BREAST BIOPSY Left 12/16/2022   [place] LT BREAST BX W LOC DEV 1ST LESION IMG BX SPEC US GUIDE 12/16/2022 [facility]   BREAST BIOPSY Left 01/04/2023   [place] LT RADIO FREQUENCY TAG LOC US GUIDE  01/04/2023 Edwin Cap, MD [facility]   BREAST BIOPSY Left 01/03/2024   [place] LT BREAST BX W LOC DEV 1ST LESION IMG BX SPEC US GUIDE 01/03/2024 Edwin Cap, MD [facility]   COLONOSCOPY  03/25/2004   ZOX:WRUE canal hemorrhoids, otherwise normal rectum,   DILITATION & CURRETTAGE/HYSTROSCOPY WITH NOVASURE ABLATION N/A 11/08/2017   Procedure: DILATATION & CURETTAGE/HYSTEROSCOPY WITH NOVASURE ENDOMETRIAL ABLATION;  Surgeon: Tilda Burrow, MD;  Location: [facility];  Service: Gynecology;  Laterality: N/A;   ESOPHAGOGASTRODUODENOSCOPY  03/25/2004   AVW:[id number] esophagus, small hiatal hernia, otherwise normal stomach   kidney stones  05/2014,11/2014   LAPAROSCOPIC BILATERAL SALPINGECTOMY Bilateral 11/08/2017   Procedure: LAPAROSCOPIC BILATERAL SALPINGECTOMY;  Surgeon: Tilda Burrow, MD;  Location: [facility];  Service: Gynecology;  Laterality: Bilateral;   LITHOTRIPSY  2008   PARTIAL MASTECTOMY WITH AXILLARY SENTINEL LYMPH NODE BIOPSY Left 01/10/2023   Procedure: PARTIAL MASTECTOMY WITH AXILLARY SENTINEL LYMPH NODE BIOPSY AND RADIOFREQUENCY TAG PLACEMENT;  Surgeon: Lucretia Roers, MD;  Location: [facility];  Service: General;  Laterality: Left;   PORTACATH PLACEMENT Right 02/11/2023   Procedure: INSERTION PORT-A-CATH;  Surgeon: Lucretia Roers, MD;  Location: [facility];  Service: General;  Laterality: Right;    Social History: Social History   Socioeconomic History   Marital status: Married    Spouse name: Not on file   Number of children: Not on file   Years of education: Not on file   Highest education level: Not on file  Occupational History   Not on file  Tobacco Use   Smoking status: Never   Smokeless tobacco: Never  Vaping Use   Vaping status: Never Used  Substance and Sexual Activity   Alcohol use: Yes    Comment: occ   Drug use: No   Sexual activity: Not Currently    Birth control/protection: Surgical  Other Topics Concern   Not on file  Social History Narrative   Not on file    Social Drivers of Health   Financial Resource Strain: Low Risk  (10/13/2023)   Received from [facility]   Overall Financial Resource Strain (CARDIA)    Difficulty of Paying Living Expenses: Not hard at all  Food Insecurity: No Food Insecurity (10/13/2023)   Received from [facility]   Hunger Vital Sign    Worried About Running Out of Food in the Last Year: Never true    Ran Out of Food in the Last Year: Never true  Transportation Needs: No Transportation Needs (  10/13/2023)   Received [facility] - Transportation    Lack of Transportation (Medical): No    Lack of Transportation (Non-Medical): No  Physical Activity: Insufficiently Active (07/07/2023)   [facility]   Exercise Vital Sign    Days of Exercise per Week: 7 days    Minutes of Exercise per Session: 20 min  Stress: No Stress Concern Present (07/07/2023)   Received from [facility] - Occupational Stress Questionnaire    Feeling of Stress : Not at all  Social Connections: Socially Integrated (07/07/2023)   Received from [facility]   Social Connection and Isolation Panel [NHANES]    Frequency of Communication with Friends and Family: More than three times a week    Frequency of Social Gatherings with Friends and Family: More than three times a week    Attends Religious Services: More than 4 times per year    Active Member of [employer] or Organizations: Yes    Attends Engineer, structural: More than 4 times per year    Marital Status: Married  Catering manager Violence: Not At Risk (12/23/2022)   Humiliation, Afraid, Rape, and Kick questionnaire    Fear of Current or Ex-Partner: No    Emotionally Abused: No    Physically Abused: No    Sexually Abused: No    Family History: Family History  Problem Relation Age of Onset   Stroke Mother    CAD Mother    Prostate cancer Father 43   Kidney cancer Brother        dx 6s   Factor  V Leiden deficiency Brother    Fibromyalgia Brother    Breast cancer Paternal Grandmother        dx >50   Lung cancer Paternal Grandfather     Current Medications:  Current Outpatient Medications:    acetaminophen (TYLENOL) 500 MG tablet, Take 1,000 mg by mouth every 6 (six) hours as needed for moderate pain or headache., Disp: , Rfl:    aluminum-magnesium hydroxide 200-200 MG/5ML suspension, Take 15 mLs by mouth 4 (four) times daily as needed for indigestion. Mix 1:1 with lidocaine and swish and swallow, Disp: 355 mL, Rfl: 0   anastrozole (ARIMIDEX) 1 MG tablet, Take 1 tablet (1 mg total) by mouth daily., Disp: 90 tablet, Rfl: 2   Cholecalciferol (VITAMIN D3) 50 MCG (2000 UT) CAPS, Take 1 capsule by mouth daily., Disp: , Rfl:    gabapentin (NEURONTIN) 300 MG capsule, TAKE 1 CAPSULE BY MOUTH TWICE A DAY, Disp: 60 capsule, Rfl: 2   ibuprofen (ADVIL,MOTRIN) 200 MG tablet, Take 400 mg by mouth every 6 (six) hours as needed for headache or moderate pain., Disp: , Rfl:    KLOR-CON M20 20 MEQ tablet, TAKE 1 TABLET BY MOUTH EVERY DAY, Disp: 90 tablet, Rfl: 1   levothyroxine (SYNTHROID, LEVOTHROID) 88 MCG tablet, TAKE 88 MCG BY MOUTH IN THE MORNING, Disp: 90 tablet, Rfl: 3   lidocaine (XYLOCAINE) 2 % solution, Use as directed 15 mLs in the mouth or throat 4 (four) times daily as needed for mouth pain. Mix 1:1 with maalox and swish and swallow, Disp: 100 mL, Rfl: 0   lidocaine-prilocaine (EMLA) cream, Apply a quarter sized amount to port a cath site and cover with plastic wrap 1 hour prior to infusion appointments, Disp: 30 g, Rfl: 3   loratadine (CLARITIN) 10 MG tablet, Take 10 mg by mouth daily.,  Disp: , Rfl:    ondansetron (ZOFRAN) 4 MG tablet, Take 1 tablet (4 mg total) by mouth every 8 (eight) hours as needed., Disp: 30 tablet, Rfl: 1   oxyCODONE (ROXICODONE) 5 MG immediate release tablet, Take 1 tablet (5 mg total) by mouth every 4 (four) hours as needed for severe pain or breakthrough pain.,  Disp: 5 tablet, Rfl: 0   Polyethylene Glycol 400 (VISINE DRY EYE RELIEF OP), Place 1 drop into both eyes daily as needed (dry eye)., Disp: , Rfl:    promethazine (PHENERGAN) 25 MG tablet, Take 1 tablet (25 mg total) by mouth every 6 (six) hours as needed for nausea or vomiting., Disp: 30 tablet, Rfl: 3   rOPINIRole (REQUIP) 2 MG tablet, TAKE ONE TABLET BY MOUTH AT BEDTIME, Disp: 30 tablet, Rfl: 9   sucralfate (CARAFATE) 1 GM/10ML suspension, Take 10 mLs (1 g total) by mouth 4 (four) times daily -  with meals and at bedtime., Disp: 420 mL, Rfl: 0   TRINTELLIX 20 MG TABS tablet, Take 20 mg by mouth daily., Disp: , Rfl:    trolamine salicylate (ASPERCREME) 10 % cream, Apply 1 application topically as needed for muscle pain., Disp: , Rfl:    XARELTO 20 MG TABS tablet, , Disp: , Rfl: 2   Allergies: No Known Allergies  REVIEW OF SYSTEMS:   Review of Systems  Constitutional:  Negative for chills, fatigue and fever.  HENT:   Negative for lump/mass, mouth sores, nosebleeds, sore throat and trouble swallowing.   Eyes:  Negative for eye problems.  Respiratory:  Positive for cough and shortness of breath.   Cardiovascular:  Negative for chest pain, leg swelling and palpitations.  Gastrointestinal:  Negative for abdominal pain, constipation, diarrhea, nausea and vomiting.  Endocrine: Positive for hot flashes.  Genitourinary:  Negative for bladder incontinence, difficulty urinating, dysuria, frequency, hematuria and nocturia.   Musculoskeletal:  Negative for arthralgias, back pain, flank pain, myalgias and neck pain.       +left breast tenderness, 2/10 pain severity  Skin:  Negative for itching and rash.  Neurological:  Positive for headaches and numbness (in left arm). Negative for dizziness.  Hematological:  Does not bruise/bleed easily.  Psychiatric/Behavioral:  Positive for sleep disturbance. Negative for depression and suicidal ideas. The patient is not nervous/anxious.   All other systems  reviewed and are negative.    VITALS:   Blood pressure 134/77, pulse 85, temperature 98.2 F (36.8 C), temperature source Oral, resp. rate 16, weight 197 lb 15.6 oz (89.8 kg), SpO2 100%.  Wt Readings from Last 3 Encounters:  01/10/24 197 lb 15.6 oz (89.8 kg)  08/17/23 196 lb 9.6 oz (89.2 kg)  07/05/23 196 lb 12.8 oz (89.3 kg)    Body mass index is 32.94 kg/m.  Performance status (ECOG): 1 - Symptomatic but completely ambulatory  PHYSICAL EXAM:   Physical Exam Vitals and nursing note reviewed. Exam conducted with a chaperone present.  Constitutional:      Appearance: Normal appearance.  Cardiovascular:     Rate and Rhythm: Normal rate and regular rhythm.     Pulses: Normal pulses.     Heart sounds: Normal heart sounds.  Pulmonary:     Effort: Pulmonary effort is normal.     Breath sounds: Normal breath sounds.  Abdominal:     Palpations: Abdomen is soft. There is no hepatomegaly, splenomegaly or mass.     Tenderness: There is no abdominal tenderness.  Musculoskeletal:     Right lower leg:  No edema.     Left lower leg: No edema.  Lymphadenopathy:     Cervical: No cervical adenopathy.     Right cervical: No superficial, deep or posterior cervical adenopathy.    Left cervical: No superficial, deep or posterior cervical adenopathy.     Upper Body:     Right upper body: No supraclavicular or axillary adenopathy.     Left upper body: No supraclavicular or axillary adenopathy.  Neurological:     General: No focal deficit present.     Mental Status: She is alert and oriented to person, place, and time.  Psychiatric:        Mood and Affect: Mood normal.        Behavior: Behavior normal.     LABS:      Latest Ref Rng & Units 01/03/2024   10:04 AM 08/17/2023    2:25 PM 07/05/2023    8:49 AM  CBC  WBC 4.0 - 10.5 K/uL 6.5  8.7  6.5   Hemoglobin 12.0 - 15.0 g/dL 53.6  64.4  03.4   Hematocrit 36.0 - 46.0 % 39.1  37.0  35.7   Platelets 150 - 400 K/uL 218  236  263        Latest Ref Rng & Units 01/03/2024   10:04 AM 08/17/2023    2:25 PM 07/05/2023    8:49 AM  CMP  Glucose 70 - 99 mg/dL 94  89  742   BUN 6 - 20 mg/dL 10  10  10    Creatinine 0.44 - 1.00 mg/dL 5.95  6.38  7.56   Sodium 135 - 145 mmol/L 137  138  137   Potassium 3.5 - 5.1 mmol/L 3.6  3.5  3.5   Chloride 98 - 111 mmol/L 103  105  103   CO2 22 - 32 mmol/L 24  25  23    Calcium 8.9 - 10.3 mg/dL 9.1  8.9  8.8   Total Protein 6.5 - 8.1 g/dL 7.0  7.0  6.7   Total Bilirubin 0.0 - 1.2 mg/dL 0.5  0.5  0.3   Alkaline Phos 38 - 126 U/L 96  99  94   AST 15 - 41 U/L 25  29  28    ALT 0 - 44 U/L 23  27  25       No results found for: "CEA1", "CEA" / No results found for: "CEA1", "CEA" No results found for: "PSA1" No results found for: "EPP295" No results found for: "CAN125"  No results found for: "TOTALPROTELP", "ALBUMINELP", "A1GS", "A2GS", "BETS", "BETA2SER", "GAMS", "MSPIKE", "SPEI" Lab Results  Component Value Date   FERRITIN 110 11/13/2013   FERRITIN 81 08/07/2013   No results found for: "LDH"   STUDIES:   [place] LT BREAST BX W LOC DEV 1ST LESION IMG BX SPEC US GUIDE Addendum Date: 01/05/2024 ADDENDUM REPORT: 01/05/2024 14:48 ADDENDUM: PATHOLOGY revealed: A. BREAST, LEFT RETROAREOLAR MASS, NEEDLE CORE BIOPSY: - Dense fibrotic tissue with extensive hemosiderin deposition with focal attenuated ductal epithelium suggestive of a cyst/dilated duct and rare benign breast elements. Note: The needle cores consists of fragments of predominantly dense fibrous soft tissue with extensive mechanical artifact. Deposited within this dense fibrous stroma is a significant amount of hemosiderin. There is very limited benign breast parenchymal tissue with a very focal thin/attenuated strip of ductal epithelium suggestive of a cyst/dilated duct. The above features suggest either reaction to a ruptured duct or possibly scarring from the patient's lumpectomy. Pathology results are CONCORDANT with  imaging findings, per Dr.  Edwin Cap. Pathology results and recommendations were discussed with patient via telephone on 01/05/2024 by Randa Lynn RN. Patient reported biopsy site doing well with no adverse symptoms, and only slight tenderness at the site. Post biopsy care instructions were reviewed, questions were answered and my direct phone number was provided. Patient was instructed to call [facility] Mammography Department for any additional questions or concerns related to biopsy site. RECOMMENDATION: Patient to return in six months for unilateral LEFT breast diagnostic mammogram (and ultrasound if deemed necessary) to ensure stability of biopsied area. Patient informed a reminder notice will be sent regarding this appointment and she may need to call mammography site to schedule this appointment. Pathology results reported by Randa Lynn RN on 01/05/2024. Electronically Signed   By: Edwin Cap M.D.   On: 01/05/2024 14:48   Result Date: 01/05/2024 CLINICAL DATA:  Patient presents for ultrasound-guided core biopsy of an indeterminate 1.1 cm mass in retroareolar left breast. EXAM: ULTRASOUND GUIDED LEFT BREAST CORE NEEDLE BIOPSY COMPARISON:  Previous exam(s). PROCEDURE: I met with the patient and we discussed the procedure of ultrasound-guided biopsy, including benefits and alternatives. We discussed the high likelihood of a successful procedure. We discussed the risks of the procedure, including infection, bleeding, tissue injury, clip migration, and inadequate sampling. Informed written consent was given. The usual time-out protocol was performed immediately prior to the procedure. Lesion quadrant: Upper outer Using sterile technique and 1% Lidocaine as local anesthetic, under direct ultrasound visualization, a 14 gauge spring-loaded device was used to perform biopsy of the mass in the retroareolar left breast using a medial to lateral approach. At the conclusion of the procedure a ribbon shaped tissue marker  clip was deployed into the biopsy cavity. Follow up 2 view mammogram was performed and dictated separately. IMPRESSION: Ultrasound guided biopsy of the mass in the retroareolar left breast. No apparent complications. Electronically Signed: By: Edwin Cap M.D. On: 01/03/2024 09:57   MM CLIP PLACEMENT LEFT Result Date: 01/03/2024 CLINICAL DATA:  Post ultrasound-guided core biopsy a mass in the retroareolar left breast. EXAM: 3D DIAGNOSTIC LEFT MAMMOGRAM POST ULTRASOUND BIOPSY COMPARISON:  Previous exam(s). ACR Breast Density Category c: The breasts are heterogeneously dense, which may obscure small masses. FINDINGS: 3D Mammographic images were obtained following ultrasound-guided core biopsy a mass in the retroareolar left breast. A ribbon shaped biopsy marking clip is present at the site of the biopsied mass in the retroareolar left breast. IMPRESSION: Ribbon shaped biopsy marking clip site biopsied mass in the retroareolar left breast. Final Assessment: Post Procedure Mammograms for Marker Placement Electronically Signed   By: Edwin Cap M.D.   On: 01/03/2024 09:57   MM 3D DIAGNOSTIC MAMMOGRAM BILATERAL BREAST Result Date: 12/20/2023 CLINICAL DATA:  53 year old female with history of left breast cancer post lumpectomy 01/10/2023. EXAM: DIGITAL DIAGNOSTIC BILATERAL MAMMOGRAM WITH TOMOSYNTHESIS AND CAD; ULTRASOUND LEFT BREAST LIMITED TECHNIQUE: Bilateral digital diagnostic mammography and breast tomosynthesis was performed. The images were evaluated with computer-aided detection. ; Targeted ultrasound examination of the left breast was performed. COMPARISON:  Previous exam(s). ACR Breast Density Category c: The breasts are heterogeneously dense, which may obscure small masses. FINDINGS: No suspicious masses or calcifications are seen in the right breast. New lumpectomy changes present in the upper-outer left breast. Spot compression tomograms were performed at the site of palpable concern in the inner  left breast demonstrating skin thickening however no discrete underlying this region. There is a persistent asymmetry in the  retroareolar left breast. Physical examination at site of palpable concern in the inner left breast reveals thickening without a discrete underlying palpable mass. Targeted ultrasound of the inner left breast in the region of palpable concern was performed demonstrating skin in subcutaneous edema however no suspicious masses or abnormalities in this location. There is an irregular hypoechoic mass in the retroareolar left breast measuring 1.1 x 1 x 1 cm. This is felt to correspond with the persistent asymmetry seen in the retroareolar left breast at mammography, possibly focal fluid/edema however cannot be clearly characterized as such. No abnormal lymph nodes seen in the left axilla. IMPRESSION: Indeterminate 1.1 cm mass in the retroareolar left breast. RECOMMENDATION: Recommend ultrasound guided core biopsy of the in the retroareolar left breast. I have discussed the findings and recommendations with the patient. If applicable, a reminder letter will be sent to the patient regarding the next appointment. BI-RADS CATEGORY  4: Suspicious. Electronically Signed   By: Edwin Cap M.D.   On: 12/20/2023 14:34   [place] LIMITED ULTRASOUND INCLUDING AXILLA LEFT BREAST  Result Date: 12/20/2023 CLINICAL DATA:  53 year old female with history of left breast cancer post lumpectomy 01/10/2023. EXAM: DIGITAL DIAGNOSTIC BILATERAL MAMMOGRAM WITH TOMOSYNTHESIS AND CAD; ULTRASOUND LEFT BREAST LIMITED TECHNIQUE: Bilateral digital diagnostic mammography and breast tomosynthesis was performed. The images were evaluated with computer-aided detection. ; Targeted ultrasound examination of the left breast was performed. COMPARISON:  Previous exam(s). ACR Breast Density Category c: The breasts are heterogeneously dense, which may obscure small masses. FINDINGS: No suspicious masses or calcifications are seen in the  right breast. New lumpectomy changes present in the upper-outer left breast. Spot compression tomograms were performed at the site of palpable concern in the inner left breast demonstrating skin thickening however no discrete underlying this region. There is a persistent asymmetry in the retroareolar left breast. Physical examination at site of palpable concern in the inner left breast reveals thickening without a discrete underlying palpable mass. Targeted ultrasound of the inner left breast in the region of palpable concern was performed demonstrating skin in subcutaneous edema however no suspicious masses or abnormalities in this location. There is an irregular hypoechoic mass in the retroareolar left breast measuring 1.1 x 1 x 1 cm. This is felt to correspond with the persistent asymmetry seen in the retroareolar left breast at mammography, possibly focal fluid/edema however cannot be clearly characterized as such. No abnormal lymph nodes seen in the left axilla. IMPRESSION: Indeterminate 1.1 cm mass in the retroareolar left breast. RECOMMENDATION: Recommend ultrasound guided core biopsy of the in the retroareolar left breast. I have discussed the findings and recommendations with the patient. If applicable, a reminder letter will be sent to the patient regarding the next appointment. BI-RADS CATEGORY  4: Suspicious. Electronically Signed   By: Edwin Cap M.D.   On: 12/20/2023 14:34

## 2024-01-10 NOTE — Patient Instructions (Addendum)
 [facility] at [facility] Discharge Instructions   You were seen and examined today by Dr. Ellin Saba.  He reviewed the results of your lab work which are normal/stable.   He reviewed the results of your mammogram and biopsy which was benign.   We will see you back in 6 months.   Return as scheduled.    Thank you for choosing [facility] at [facility] to provide your oncology and hematology care.  To afford each patient quality time with our provider, please arrive at least 15 minutes before your scheduled appointment time.   If you have a lab appointment with the Cancer Center please come in thru the Main Entrance and check in at the main information desk.  You need to re-schedule your appointment should you arrive 10 or more minutes late.  We strive to give you quality time with our providers, and arriving late affects you and other patients whose appointments are after yours.  Also, if you no show three or more times for appointments you may be dismissed from the clinic at the providers discretion.     Again, thank you for choosing [facility].  Our hope is that these requests will decrease the amount of time that you wait before being seen by our physicians.       _____________________________________________________________  Should you have questions after your visit to [facility], please contact our office at [phone] and follow the prompts.  Our office hours are 8:00 a.m. and 4:30 p.m. Monday - Friday.  Please note that voicemails left after 4:00 p.m. may not be returned until the following business day.  We are closed weekends and major holidays.  You do have access to a nurse 24-7, just call the main number to the clinic [phone] and do not press any options, hold on the line and a nurse will answer the phone.    For prescription refill requests, have your pharmacy contact our office and allow 72  hours.    Due to Covid, you will need to wear a mask upon entering the hospital. If you do not have a mask, a mask will be given to you at the Main Entrance upon arrival. For doctor visits, patients may have 1 support person age 53 or older with them. For treatment visits, patients can not have anyone with them due to social distancing guidelines and our immunocompromised population.

## 2024-01-11 ENCOUNTER — Other Ambulatory Visit: Payer: Self-pay

## 2024-02-28 ENCOUNTER — Encounter: Payer: Self-pay | Admitting: Hematology

## 2024-03-06 ENCOUNTER — Other Ambulatory Visit (HOSPITAL_COMMUNITY)
Admission: RE | Admit: 2024-03-06 | Discharge: 2024-03-06 | Disposition: A | Discharge status: Home / Self Care | Source: Ambulatory Visit | Attending: Adult Health | Admitting: Adult Health

## 2024-03-06 ENCOUNTER — Encounter: Payer: Self-pay | Admitting: Adult Health

## 2024-03-06 ENCOUNTER — Ambulatory Visit: Admitting: Adult Health

## 2024-03-06 VITALS — BP 149/81 | HR 85 | Ht 65.0 in | Wt 201.0 lb

## 2024-03-06 DIAGNOSIS — Z1211 Encounter for screening for malignant neoplasm of colon: Secondary | ICD-10-CM | POA: Diagnosis not present

## 2024-03-06 DIAGNOSIS — R030 Elevated blood-pressure reading, without diagnosis of hypertension: Secondary | ICD-10-CM | POA: Diagnosis not present

## 2024-03-06 DIAGNOSIS — Z853 Personal history of malignant neoplasm of breast: Secondary | ICD-10-CM

## 2024-03-06 DIAGNOSIS — Z01419 Encounter for gynecological examination (general) (routine) without abnormal findings: Secondary | ICD-10-CM | POA: Diagnosis present

## 2024-03-06 DIAGNOSIS — Z1331 Encounter for screening for depression: Secondary | ICD-10-CM

## 2024-03-06 LAB — HEMOCCULT GUIAC POC 1CARD (OFFICE): Fecal Occult Blood, POC: NEGATIVE

## 2024-03-06 NOTE — Progress Notes (Signed)
 Patient ID: ANABELLE FREIERMUTH, female   DOB: 08/14/1971, 53 y.o.   MRN: [id number] History of Present Illness: Zelda is a 53 year old white female, married, PM in for a well woman gyn exam and pap. She had breast cancer last year, had chemo and radiation and is on arimidex  now.   PCP is Herb Loges PA.    Current Medications, Allergies, Past Medical History, Past Surgical History, Family History and Social History were reviewed in [employer] record.     Review of Systems: Patient denies any headaches, hearing loss, blurred vision, shortness of breath, chest pain, abdominal pain, problems with bowel movements, urination, or intercourse. No joint pain or mood swings.  She has hot flashes, vaginal dryness, weight gain, body aches and brain fog, discussed could be chem related or menopause related   Physical Exam:BP (!) 149/81 (BP Location: Right Arm, Patient Position: Sitting, Cuff Size: Normal)   Pulse 85   Ht 5\' 5"  (1.651 m)   Wt 201 lb (91.2 kg)   LMP  (Exact Date)   BMI 33.45 kg/m   General:  Well developed, well nourished, no acute distress Skin:  Warm and dry Neck:  Midline trachea, normal thyroid, good ROM, no lymphadenopathy Lungs; Clear to auscultation bilaterally Breast:  No dominant palpable mass, retraction, or nipple discharge, has some skin thickness left breast from radiation  Cardiovascular: Regular rate and rhythm Abdomen:  Soft, non tender, no hepatosplenomegaly Pelvic:  External genitalia is normal in appearance, no lesions.  The vagina is pale. Urethra has no lesions or masses. The cervix is smooth, pap with HR HPV genotyping performed.  Uterus is felt to be normal size, shape, and contour.  No adnexal masses or tenderness noted.Bladder is non tender, no masses felt. Rectal: Good sphincter tone, no polyps, or hemorrhoids felt.  Hemoccult negative. Extremities/musculoskeletal:  No swelling or varicosities noted, no clubbing or cyanosis Psych:  No mood  changes, alert and cooperative,seems happy AA is 0 Fall risk is low    03/06/2024    1:37 PM 12/23/2022    8:29 AM 09/26/2017    9:26 AM  Depression screen PHQ 2/9  Decreased Interest 1 0 1  Down, Depressed, Hopeless 2 1 1   PHQ - 2 Score 3 1 2   Altered sleeping 2  1  Tired, decreased energy 3  1  Change in appetite 3  1  Feeling bad or failure about yourself  1  1  Trouble concentrating 1  1  Moving slowly or fidgety/restless 0  0  Suicidal thoughts 0  0  PHQ-9 Score 13  7  Difficult doing work/chores   Not difficult at all   On trintellix     03/06/2024    1:37 PM  GAD 7 : Generalized Anxiety Score  Nervous, Anxious, on Edge 1  Control/stop worrying 1  Worry too much - different things 1  Trouble relaxing 1  Restless 0  Easily annoyed or irritable 2  Afraid - awful might happen 0  Total GAD 7 Score 6      Upstream - 03/06/24 1334       Pregnancy Intention Screening   Does the patient want to become pregnant in the next year? N/A    Does the patient's partner want to become pregnant in the next year? N/A    Would the patient like to discuss contraceptive options today? N/A      Contraception Wrap Up   Current Method Female Sterilization  End Method Female Sterilization    Contraception Counseling Provided No            Examination chaperoned by Alphonso Aschoff LPN   Impression and plan: 1. Encounter for gynecological examination with Papanicolaou smear of cervix (Primary) Pap sent Pap in 3 years if normal Physical in 1 year Labs with PCP and  Dr Linnell Richardson Mammogram 12/30/23 and follow up 01/03/24 for biopsy, repeat  diagnostic left mammogram in 6 months and bilateral yearly  Colonoscopy per GI  - Cytology - PAP( [place]) Review handout on menopause  2. History of breast cancer On arimidex    3. Encounter for screening fecal occult blood testing Hemoccult was negative  - POCT occult blood stool  4. Elevated BP without diagnosis of hypertension Keep check on  BP

## 2024-03-08 LAB — CYTOLOGY - PAP
Comment: NEGATIVE
Diagnosis: NEGATIVE
High risk HPV: NEGATIVE

## 2024-04-11 ENCOUNTER — Other Ambulatory Visit: Payer: Self-pay

## 2024-04-13 ENCOUNTER — Encounter: Payer: Self-pay | Admitting: Hematology

## 2024-04-13 ENCOUNTER — Inpatient Hospital Stay: Attending: Hematology

## 2024-04-13 VITALS — BP 150/86 | HR 71 | Resp 18

## 2024-04-13 DIAGNOSIS — Z1732 Human epidermal growth factor receptor 2 negative status: Secondary | ICD-10-CM | POA: Diagnosis not present

## 2024-04-13 DIAGNOSIS — Z452 Encounter for adjustment and management of vascular access device: Secondary | ICD-10-CM | POA: Diagnosis present

## 2024-04-13 DIAGNOSIS — Z95828 Presence of other vascular implants and grafts: Secondary | ICD-10-CM

## 2024-04-13 DIAGNOSIS — Z1721 Progesterone receptor positive status: Secondary | ICD-10-CM | POA: Insufficient documentation

## 2024-04-13 DIAGNOSIS — Z170 Estrogen receptor positive status [ER+]: Secondary | ICD-10-CM | POA: Insufficient documentation

## 2024-04-13 DIAGNOSIS — C50412 Malignant neoplasm of upper-outer quadrant of left female breast: Secondary | ICD-10-CM | POA: Insufficient documentation

## 2024-04-13 DIAGNOSIS — Z79811 Long term (current) use of aromatase inhibitors: Secondary | ICD-10-CM | POA: Insufficient documentation

## 2024-04-13 MED ORDER — SODIUM CHLORIDE 0.9% FLUSH
10.0000 mL | INTRAVENOUS | Status: DC | PRN
  Administered 2024-04-13: 10 mL via INTRAVENOUS

## 2024-04-13 MED ORDER — HEPARIN SOD (PORK) LOCK FLUSH 100 UNIT/ML IV SOLN
500.0000 [IU] | Freq: Once | INTRAVENOUS | Status: AC
  Administered 2024-04-13: 500 [IU] via INTRAVENOUS

## 2024-04-13 NOTE — Patient Instructions (Signed)

## 2024-04-13 NOTE — Progress Notes (Signed)
 Patients port flushed without difficulty.  Good blood return noted with no bruising or swelling noted at site.  Band aid applied.  VSS with discharge and left in satisfactory condition with no s/s of distress noted.

## 2024-04-21 ENCOUNTER — Other Ambulatory Visit: Payer: Self-pay | Admitting: Hematology

## 2024-04-23 ENCOUNTER — Encounter: Payer: Self-pay | Admitting: Hematology

## 2024-05-22 ENCOUNTER — Other Ambulatory Visit: Payer: Self-pay | Admitting: Hematology

## 2024-05-22 DIAGNOSIS — Z170 Estrogen receptor positive status [ER+]: Secondary | ICD-10-CM

## 2024-06-19 ENCOUNTER — Other Ambulatory Visit (HOSPITAL_COMMUNITY): Payer: Self-pay | Admitting: Oncology

## 2024-06-19 DIAGNOSIS — R928 Other abnormal and inconclusive findings on diagnostic imaging of breast: Secondary | ICD-10-CM

## 2024-06-20 ENCOUNTER — Other Ambulatory Visit (HOSPITAL_COMMUNITY): Payer: Self-pay | Admitting: Oncology

## 2024-06-20 DIAGNOSIS — R928 Other abnormal and inconclusive findings on diagnostic imaging of breast: Secondary | ICD-10-CM

## 2024-07-10 ENCOUNTER — Ambulatory Visit (HOSPITAL_COMMUNITY)
Admission: RE | Admit: 2024-07-10 | Discharge: 2024-07-10 | Disposition: A | Discharge status: Home / Self Care | Source: Ambulatory Visit | Attending: Oncology | Admitting: Oncology

## 2024-07-10 ENCOUNTER — Inpatient Hospital Stay: Attending: Hematology

## 2024-07-10 DIAGNOSIS — C50412 Malignant neoplasm of upper-outer quadrant of left female breast: Secondary | ICD-10-CM | POA: Diagnosis present

## 2024-07-10 DIAGNOSIS — Z7901 Long term (current) use of anticoagulants: Secondary | ICD-10-CM | POA: Insufficient documentation

## 2024-07-10 DIAGNOSIS — Z170 Estrogen receptor positive status [ER+]: Secondary | ICD-10-CM | POA: Insufficient documentation

## 2024-07-10 DIAGNOSIS — Z1721 Progesterone receptor positive status: Secondary | ICD-10-CM | POA: Insufficient documentation

## 2024-07-10 DIAGNOSIS — R232 Flushing: Secondary | ICD-10-CM | POA: Insufficient documentation

## 2024-07-10 DIAGNOSIS — Z79899 Other long term (current) drug therapy: Secondary | ICD-10-CM | POA: Insufficient documentation

## 2024-07-10 DIAGNOSIS — Z79811 Long term (current) use of aromatase inhibitors: Secondary | ICD-10-CM | POA: Insufficient documentation

## 2024-07-10 DIAGNOSIS — R928 Other abnormal and inconclusive findings on diagnostic imaging of breast: Secondary | ICD-10-CM | POA: Insufficient documentation

## 2024-07-10 DIAGNOSIS — G2581 Restless legs syndrome: Secondary | ICD-10-CM | POA: Diagnosis not present

## 2024-07-10 DIAGNOSIS — M2550 Pain in unspecified joint: Secondary | ICD-10-CM | POA: Insufficient documentation

## 2024-07-10 DIAGNOSIS — Z1732 Human epidermal growth factor receptor 2 negative status: Secondary | ICD-10-CM | POA: Insufficient documentation

## 2024-07-10 DIAGNOSIS — Z86711 Personal history of pulmonary embolism: Secondary | ICD-10-CM | POA: Diagnosis not present

## 2024-07-10 LAB — CBC WITH DIFFERENTIAL/PLATELET
Abs Immature Granulocytes: 0.02 K/uL (ref 0.00–0.07)
Basophils Absolute: 0 K/uL (ref 0.0–0.1)
Basophils Relative: 1 %
Eosinophils Absolute: 0.1 K/uL (ref 0.0–0.5)
Eosinophils Relative: 2 %
HCT: 40.2 % (ref 36.0–46.0)
Hemoglobin: 13.2 g/dL (ref 12.0–15.0)
Immature Granulocytes: 0 %
Lymphocytes Relative: 21 %
Lymphs Abs: 1.2 K/uL (ref 0.7–4.0)
MCH: 30.6 pg (ref 26.0–34.0)
MCHC: 32.8 g/dL (ref 30.0–36.0)
MCV: 93.1 fL (ref 80.0–100.0)
Monocytes Absolute: 1.2 K/uL — ABNORMAL HIGH (ref 0.1–1.0)
Monocytes Relative: 21 %
Neutro Abs: 3.3 K/uL (ref 1.7–7.7)
Neutrophils Relative %: 55 %
Platelets: 206 K/uL (ref 150–400)
RBC: 4.32 MIL/uL (ref 3.87–5.11)
RDW: 13.2 % (ref 11.5–15.5)
WBC: 6 K/uL (ref 4.0–10.5)
nRBC: 0 % (ref 0.0–0.2)

## 2024-07-10 LAB — COMPREHENSIVE METABOLIC PANEL WITH GFR
ALT: 21 U/L (ref 0–44)
AST: 25 U/L (ref 15–41)
Albumin: 3.4 g/dL — ABNORMAL LOW (ref 3.5–5.0)
Alkaline Phosphatase: 116 U/L (ref 38–126)
Anion gap: 10 (ref 5–15)
BUN: 12 mg/dL (ref 6–20)
CO2: 23 mmol/L (ref 22–32)
Calcium: 8.9 mg/dL (ref 8.9–10.3)
Chloride: 103 mmol/L (ref 98–111)
Creatinine, Ser: 0.58 mg/dL (ref 0.44–1.00)
GFR, Estimated: >60 mL/min (ref >60)
Glucose, Bld: 89 mg/dL (ref 70–99)
Potassium: 3.5 mmol/L (ref 3.5–5.1)
Sodium: 136 mmol/L (ref 135–145)
Total Bilirubin: 0.7 mg/dL (ref 0.0–1.2)
Total Protein: 7 g/dL (ref 6.5–8.1)

## 2024-07-10 LAB — MAGNESIUM: Magnesium: 1.9 mg/dL (ref 1.7–2.4)

## 2024-07-10 LAB — VITAMIN D 25 HYDROXY (VIT D DEFICIENCY, FRACTURES): Vit D, 25-Hydroxy: 34.77 ng/mL (ref 30–100)

## 2024-07-10 NOTE — Progress Notes (Signed)
 Searra Carnathan [facility] presented for Portacath access and flush.  Portacath located  chest wall accessed with  H 20 needle.  Good blood return present. Portacath flushed with 20 mL of NS needle removed intact. No bruising or swelling noted at the site. Procedure tolerated well and without incident.     Discharged from clinic ambulatory in stable condition. Alert and oriented x 3. F/U with [facility] as scheduled.

## 2024-07-11 ENCOUNTER — Inpatient Hospital Stay

## 2024-07-18 ENCOUNTER — Ambulatory Visit: Admitting: Hematology

## 2024-07-23 ENCOUNTER — Inpatient Hospital Stay (HOSPITAL_BASED_OUTPATIENT_CLINIC_OR_DEPARTMENT_OTHER): Payer: Self-pay | Admitting: Oncology

## 2024-07-23 VITALS — BP 133/95 | HR 71 | Temp 98.0°F | Resp 18 | Wt 198.0 lb

## 2024-07-23 DIAGNOSIS — M2550 Pain in unspecified joint: Secondary | ICD-10-CM

## 2024-07-23 DIAGNOSIS — C50412 Malignant neoplasm of upper-outer quadrant of left female breast: Secondary | ICD-10-CM

## 2024-07-23 DIAGNOSIS — R232 Flushing: Secondary | ICD-10-CM | POA: Insufficient documentation

## 2024-07-23 DIAGNOSIS — G2581 Restless legs syndrome: Secondary | ICD-10-CM

## 2024-07-23 DIAGNOSIS — Z170 Estrogen receptor positive status [ER+]: Secondary | ICD-10-CM

## 2024-07-23 DIAGNOSIS — I2699 Other pulmonary embolism without acute cor pulmonale: Secondary | ICD-10-CM | POA: Insufficient documentation

## 2024-07-23 DIAGNOSIS — I2693 Single subsegmental pulmonary embolism without acute cor pulmonale: Secondary | ICD-10-CM

## 2024-07-23 NOTE — Assessment & Plan Note (Signed)
 Diagnosed on 08/08/2015 CT angiogram with bilateral pulmonary emboli, moderate embolus burden.  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 Heterozygosity for factor V Leiden mutation.  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  PT mutation negative.  Protein C, protein S, AT III normal.  - Continue Xarelto twice daily.

## 2024-07-23 NOTE — Assessment & Plan Note (Addendum)
 Well-controlled restless leg syndrome at this time  -Continue Requip  twice daily

## 2024-07-23 NOTE — Assessment & Plan Note (Addendum)
 Joint pain worsening at night, partially relieved by gabapentin . Concerns about gabapentin  and dementia due to family history.  - Increase gabapentin  to twice daily dosing.

## 2024-07-23 NOTE — Progress Notes (Signed)
 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 female was transferred to my care after her prior physician has left.   ASSESSMENT & PLAN:   Assessment & Plan: Judy Patton  is a 53 y.o. 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  Extensive oncology history below Oncotype DX score: 14.  Considering patient had positive metastatic disease in the lymph node, patient received adjuvant chemotherapy with dose dense AC + weekly paclitaxel  for 12 cycles.  Also received adjuvant radiation Currently on anastrozole  daily  -Patient reports some hot flashes and joint pains from anastrozole  but overall tolerating okay.  Continue anastrozole  1 mg daily - Last mammogram from 07/10/2024 was negative for carcinoma.  Repeat in 1 year - Last DEXA scan from 01/04/2023 was normal.  Will repeat in 2 years that is 12/2024 - Continue calcium and vitamin D   Return to clinic in 3 months for physical exam and further evaluation Arthralgia, unspecified joint Joint pain worsening at night, partially relieved by gabapentin . Concerns about gabapentin  and dementia due to family history.  - Increase gabapentin  to twice daily dosing. Hot flashes Hot flashes began prior to cancer diagnosis but are more or less stable at this time  - Continue to monitor at this time Single subsegmental pulmonary embolism without acute cor pulmonale (HCC) Diagnosed on 08/08/2015 CT angiogram with bilateral pulmonary emboli, moderate embolus burden.  Probable infarct in the  left lung base. Heterozygosity for factor V Leiden mutation.  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  PT mutation negative.  Protein C, protein S, AT III normal.  - Continue Xarelto twice daily. Restless leg syndrome Well-controlled restless leg syndrome at this time  -Continue Requip  twice daily    The patient understands the plans discussed today and is in agreement with them.  She knows to contact our office if she develops concerns prior to her next appointment.  60 minutes of total time was spent for this patient encounter, including preparation,review of records,  face-to-face counseling with the patient and coordination of care, physical exam, and documentation of the encounter.   Judy Dry, MD  [facility] [place] - A DEPT OF [facility] [address] [place] [address] Dept: [phone] Dept Fax: [phone]   No orders of the defined types were placed in this encounter.    ONCOLOGY HISTORY:   I have reviewed her chart and materials related to her cancer extensively and collaborated history with the patient. 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  No pathologic left axillary lymphadenopathy.  BI-RADS: 5: Highly suggestive of malignancy. -12/16/2022: Breast, left, needle core biopsy:  - Invasive mammary carcinoma overall grade 2/3, no lymphovascular invasion was identified - ER: Positive, 100%, PR: Positive, 100%, Ki-67:  1%, HER2: Negative(0) -01/04/2023: Bone density plan: T-score: -0.3, normal. - 01/10/2023: Left breast, partial mastectomy:  - Invasive moderately differentiated ductal  adenocarcinoma, grade 2.  Focal ductal carcinoma in situ, intermediate nuclear grade, cribriform type without necrosis.  - Margins free  - Left axillary sentinel lymph node: 1 1 lymph node with metastatic carcinoma and 1 lymph node with micrometastasis  -pT1c, pN80mi  - 01/20/2023: Oncotype Dx: RS score: 14.  Distant recurrence at 9 years with AI/TAM is 14%, no apparent benefit of chemotherapy -02/10/2023: PET scan: Postoperative findings in the left breast and left axilla, with doing good GU maximum SUV in the subcutaneous tissues of the left breast at the site of prior surgery, maximum SUV is 4.2. No specific indicators of active malignancy.The rim sclerotic lesion of concern in the left acetabulum demonstrates no accentuated metabolic activity and is likely an enchondroma or similar benign osseous lesion. -02/11/2023: Port placed -02/22/2023-07/05/2023: Adjuvant chemotherapy ddAC + weekly taxol  -08/02/2023: Left breast radiation -08/17/2023-Current: Anastrozole  1mg  daily -12/20/2023: Diagnostic mammogram: Indeterminate 1.1 cm mass in the retroareolar left breast.  BI-RADS: 4: Suspicious - 01/03/2024: Breast, left retroareolar mass, needle core biopsy:  - Dense fibrotic tissue with extensive hemosiderin deposition with focal denuded ductal epithelium suggestive of a cyst/dilated duct and rare benign breast elements. (Benign) - 07/10/2024: Diagnostic mammogram: Stable 1.2 cm mass in the retroareolar location of the left breast, previously biopsied with pathology revealing dense stromal fibrosis.  BI-RADS: 3: Probably benign.  Current Treatment:  Anastrazole  INTERVAL HISTORY:   Judy Patton is here today for follow up and to establish care with me for stage I ER/PR positive HER2 negative breast cancer.  She experiences joint pain, particularly worsening at night. She is currently taking gabapentin  300 mg once daily at night, which provides some relief. She is concerned about potential side  effects of gabapentin , especially regarding dementia, as her father has dementia.  She has a history of breast cancer and is currently on anastrozole . Her recent mammogram showed a benign lesion that had been previously biopsied. She is taking vitamin D  2000 IU but not calcium.  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 She was found to have heterozygous Factor V Leiden mutation and was on birth control at the time of the clot.  She experiences restless leg syndrome and takes Requip  for it, which makes her sleepy. She also reports left arm numbness that started after radiation therapy, which is not as severe now but still present.  She experiences hot flashes, which began before her cancer treatment, likely related to perimenopause. These occur both at night and during the day, causing discomfort.  I have reviewed the past medical history, past surgical history, social history and family history with the patient and they are unchanged from previous note.  ALLERGIES:  is allergic to hydrocodone-acetaminophen .  MEDICATIONS:  Current Outpatient Medications  Medication Sig Dispense Refill   acetaminophen  (TYLENOL ) 500 MG tablet Take 1,000 mg by mouth every 6 (six) hours as needed for moderate pain or headache.     anastrozole  (ARIMIDEX ) 1 MG tablet TAKE 1 TABLET BY MOUTH EVERY DAY 90 tablet 2   Cholecalciferol (VITAMIN D3) 50 MCG (2000 UT) CAPS Take 1 capsule by mouth daily.     gabapentin  (NEURONTIN ) 300 MG capsule TAKE 1 CAPSULE BY MOUTH TWICE A DAY 60 capsule 2   ibuprofen  (ADVIL ,MOTRIN ) 200 MG tablet Take 400 mg by  mouth every 6 (six) hours as needed for headache or moderate pain.     levothyroxine  (SYNTHROID , LEVOTHROID) 88 MCG tablet TAKE 88 MCG BY MOUTH IN THE MORNING 90 tablet 3   lidocaine -prilocaine  (EMLA ) cream Apply a quarter sized amount to port a cath site and cover with plastic wrap 1 hour prior to infusion  appointments 30 g 3   loratadine (CLARITIN) 10 MG tablet Take 10 mg by mouth daily.     Polyethylene Glycol 400 (VISINE [facility] RELIEF OP) Place 1 drop into both eyes daily as needed ([facility]).     rOPINIRole  (REQUIP ) 2 MG tablet TAKE ONE TABLET BY MOUTH AT BEDTIME 30 tablet 9   TRINTELLIX 20 MG TABS tablet Take 20 mg by mouth daily.     trolamine salicylate (ASPERCREME) 10 % cream Apply 1 application topically as needed for muscle pain.     XARELTO 20 MG TABS tablet   2   No current facility-administered medications for this visit.    REVIEW OF SYSTEMS:   Constitutional: Denies fevers, chills or abnormal weight loss Eyes: Denies blurriness of vision Ears, nose, mouth, throat, and face: Denies mucositis or sore throat Respiratory: Denies cough, dyspnea or wheezes Cardiovascular: Denies palpitation, chest discomfort or lower extremity swelling Gastrointestinal:  Denies nausea, heartburn or change in bowel habits Skin: Denies abnormal skin rashes Lymphatics: Denies new lymphadenopathy or easy bruising Neurological:Denies numbness, tingling or new weaknesses Behavioral/Psych: Mood is stable, no new changes  All other systems were reviewed with the patient and are negative.   VITALS:  Blood pressure (!) 133/95, pulse 71, temperature 98 F (36.7 C), temperature source Oral, resp. rate 18, weight 198 lb (89.8 kg), SpO2 99%.  Wt Readings from Last 3 Encounters:  07/23/24 198 lb (89.8 kg)  03/06/24 201 lb (91.2 kg)  01/10/24 197 lb 15.6 oz (89.8 kg)    Body mass index is 32.95 kg/m.  Performance status (ECOG): 0 - Asymptomatic  PHYSICAL EXAM:   GENERAL:alert, no distress and comfortable SKIN: skin color, texture, turgor are normal, no rashes or significant lesions BREAST: Right: Normal breast exam, no masses palpated.  Left breast exam: Surgical scar palpated in the upper outer quadrant.  No other masses palpated. LYMPH:  no palpable lymphadenopathy in the cervical, axillary or  inguinal LUNGS: clear to auscultation and percussion with normal breathing effort HEART: regular rate & rhythm and no murmurs and no lower extremity edema ABDOMEN:abdomen soft, non-tender and normal bowel sounds Musculoskeletal:no cyanosis of digits and no clubbing  NEURO: alert & oriented x 3 with fluent speech, no focal motor/sensory deficits  LABORATORY DATA:  I have reviewed the data as listed  Lab Results  Component Value Date   WBC 6.0 07/10/2024   NEUTROABS 3.3 07/10/2024   HGB 13.2 07/10/2024   HCT 40.2 07/10/2024   MCV 93.1 07/10/2024   PLT 206 07/10/2024      Chemistry      Component Value Date/Time   NA 136 07/10/2024 0916   NA 141 02/18/2015 1527   K 3.5 07/10/2024 0916   CL 103 07/10/2024 0916   CO2 23 07/10/2024 0916   BUN 12 07/10/2024 0916   BUN 9 02/18/2015 1527   CREATININE 0.58 07/10/2024 0916   CREATININE 0.53 12/26/2013 1434      Component Value Date/Time   CALCIUM 8.9 07/10/2024 0916   ALKPHOS 116 07/10/2024 0916   AST 25 07/10/2024 0916   ALT 21 07/10/2024 0916   BILITOT 0.7 07/10/2024 0916  BILITOT 0.2 02/18/2015 1527        RADIOGRAPHIC STUDIES: I have personally reviewed the radiological images as listed and agreed with the findings in the report.  US  LIMITED ULTRASOUND INCLUDING AXILLA LEFT BREAST  CLINICAL DATA:  Six-month interval follow-up after a benign core needle biopsy of a mass in the retroareolar LEFT breast at anterior depth, pathology revealing dense fibrosis with extensive hemosiderin deposition. Personal history of malignant lumpectomy from the UPPER OUTER QUADRANT of the LEFT breast in March, 2024, with adjuvant radiation therapy and chemotherapy.  EXAM: DIGITAL DIAGNOSTIC UNILATERAL LEFT MAMMOGRAM WITH TOMOSYNTHESIS AND CAD; ULTRASOUND LEFT BREAST LIMITED  TECHNIQUE: Left digital diagnostic mammography and breast tomosynthesis was performed. The images were evaluated with computer-aided detection. ; Targeted  ultrasound examination of the left breast was performed.  COMPARISON:  Previous exam(s).  ACR Breast Density Category c: The breasts are heterogeneously dense, which may obscure small masses.  FINDINGS: Full field CC and MLO views were obtained.  The mass in the retroareolar location at anterior depth is unchanged since the February, 2025 mammogram. The ribbon shaped tissue marking clip placed at the time of biopsy is present within the mass.  No new or suspicious findings elsewhere. Scar/distortion at the lumpectomy site in the UPPER OUTER QUADRANT.  Targeted ultrasound is performed, again demonstrating the hypoechoic mass in the retroareolar location measuring approximately 1.2 x 0.9 x 1.1 cm (previously 1.1 x 1.0 x 1.0 cm on 12/20/2023), demonstrating no posterior characteristics and no internal power Doppler flow. The tissue marking clip is visible within the mass.  IMPRESSION: Stable 1.2 cm mass in the retroareolar location of the LEFT breast, previously biopsied with pathology revealing dense stromal fibrosis.  RECOMMENDATION: LEFT breast ultrasound at the time of annual BILATERAL diagnostic mammography which is due in February, 2026.  I have discussed the findings and recommendations with the patient. If applicable, a reminder letter will be sent to the patient regarding the next appointment.  BI-RADS CATEGORY  3: Probably benign.  Electronically Signed   By: Debby Satterfield M.D.   On: 07/10/2024 10:40 MM 3D DIAGNOSTIC MAMMOGRAM UNILATERAL LEFT BREAST CLINICAL DATA:  Six-month interval follow-up after a benign core needle biopsy of a mass in the retroareolar LEFT breast at anterior depth, pathology revealing dense fibrosis with extensive hemosiderin deposition. Personal history of malignant lumpectomy from the UPPER OUTER QUADRANT of the LEFT breast in March, 2024, with adjuvant radiation therapy and chemotherapy.  EXAM: DIGITAL DIAGNOSTIC UNILATERAL LEFT  MAMMOGRAM WITH TOMOSYNTHESIS AND CAD; ULTRASOUND LEFT BREAST LIMITED  TECHNIQUE: Left digital diagnostic mammography and breast tomosynthesis was performed. The images were evaluated with computer-aided detection. ; Targeted ultrasound examination of the left breast was performed.  COMPARISON:  Previous exam(s).  ACR Breast Density Category c: The breasts are heterogeneously dense, which may obscure small masses.  FINDINGS: Full field CC and MLO views were obtained.  The mass in the retroareolar location at anterior depth is unchanged since the February, 2025 mammogram. The ribbon shaped tissue marking clip placed at the time of biopsy is present within the mass.  No new or suspicious findings elsewhere. Scar/distortion at the lumpectomy site in the UPPER OUTER QUADRANT.  Targeted ultrasound is performed, again demonstrating the hypoechoic mass in the retroareolar location measuring approximately 1.2 x 0.9 x 1.1 cm (previously 1.1 x 1.0 x 1.0 cm on 12/20/2023), demonstrating no posterior characteristics and no internal power Doppler flow. The tissue marking clip is visible within the mass.  IMPRESSION: Stable 1.2 cm  mass in the retroareolar location of the LEFT breast, previously biopsied with pathology revealing dense stromal fibrosis.  RECOMMENDATION: LEFT breast ultrasound at the time of annual BILATERAL diagnostic mammography which is due in February, 2026.  I have discussed the findings and recommendations with the patient. If applicable, a reminder letter will be sent to the patient regarding the next appointment.  BI-RADS CATEGORY  3: Probably benign.  Electronically Signed   By: Debby Satterfield M.D.   On: 07/10/2024 10:40

## 2024-07-23 NOTE — Assessment & Plan Note (Addendum)
 Stage I ER/PR positive, HER2 negative left breast carcinoma s/p lumpectomy.  Extensive oncology history below Oncotype DX score: 14.  Considering patient had positive metastatic disease in the lymph node, patient received adjuvant chemotherapy with dose dense AC + weekly paclitaxel  for 12 cycles.  Also received adjuvant radiation Currently on anastrozole  daily  -Patient reports some hot flashes and joint pains from anastrozole  but overall tolerating okay.  Continue anastrozole  1 mg daily - Last mammogram from 07/10/2024 was negative for carcinoma.  Repeat in 1 year - Last DEXA scan from 01/04/2023 was normal.  Will repeat in 2 years that is 12/2024 - Continue calcium and vitamin D   Return to clinic in 3 months for physical exam and further evaluation

## 2024-07-23 NOTE — Assessment & Plan Note (Addendum)
 Hot flashes began prior to cancer diagnosis but are more or less stable at this time  - Continue to monitor at this time

## 2024-07-24 ENCOUNTER — Other Ambulatory Visit: Payer: Self-pay

## 2024-09-03 ENCOUNTER — Other Ambulatory Visit: Payer: Self-pay | Admitting: *Deleted

## 2024-09-03 MED ORDER — GABAPENTIN 300 MG PO CAPS: 300.0000 mg | ORAL_CAPSULE | Freq: Two times a day (BID) | ORAL | 2 refills | Status: AC

## 2024-09-05 ENCOUNTER — Other Ambulatory Visit: Payer: Self-pay | Admitting: *Deleted

## 2024-10-12 ENCOUNTER — Inpatient Hospital Stay: Attending: Hematology

## 2024-10-12 VITALS — BP 148/86 | HR 78 | Resp 18

## 2024-10-12 DIAGNOSIS — Z79899 Other long term (current) drug therapy: Secondary | ICD-10-CM | POA: Insufficient documentation

## 2024-10-12 DIAGNOSIS — R232 Flushing: Secondary | ICD-10-CM | POA: Diagnosis not present

## 2024-10-12 DIAGNOSIS — D6851 Activated protein C resistance: Secondary | ICD-10-CM | POA: Diagnosis not present

## 2024-10-12 DIAGNOSIS — R5383 Other fatigue: Secondary | ICD-10-CM | POA: Insufficient documentation

## 2024-10-12 DIAGNOSIS — G2581 Restless legs syndrome: Secondary | ICD-10-CM | POA: Diagnosis not present

## 2024-10-12 DIAGNOSIS — C50412 Malignant neoplasm of upper-outer quadrant of left female breast: Secondary | ICD-10-CM

## 2024-10-12 DIAGNOSIS — Z7901 Long term (current) use of anticoagulants: Secondary | ICD-10-CM | POA: Diagnosis not present

## 2024-10-12 DIAGNOSIS — Z170 Estrogen receptor positive status [ER+]: Secondary | ICD-10-CM | POA: Diagnosis not present

## 2024-10-12 DIAGNOSIS — Z923 Personal history of irradiation: Secondary | ICD-10-CM | POA: Diagnosis not present

## 2024-10-12 DIAGNOSIS — Z1732 Human epidermal growth factor receptor 2 negative status: Secondary | ICD-10-CM | POA: Insufficient documentation

## 2024-10-12 DIAGNOSIS — M2550 Pain in unspecified joint: Secondary | ICD-10-CM | POA: Diagnosis not present

## 2024-10-12 DIAGNOSIS — Z9221 Personal history of antineoplastic chemotherapy: Secondary | ICD-10-CM | POA: Insufficient documentation

## 2024-10-12 DIAGNOSIS — Z79811 Long term (current) use of aromatase inhibitors: Secondary | ICD-10-CM | POA: Insufficient documentation

## 2024-10-12 DIAGNOSIS — Z1721 Progesterone receptor positive status: Secondary | ICD-10-CM | POA: Diagnosis not present

## 2024-10-12 DIAGNOSIS — Z86711 Personal history of pulmonary embolism: Secondary | ICD-10-CM | POA: Diagnosis not present

## 2024-10-12 LAB — CBC WITH DIFFERENTIAL/PLATELET
Abs Immature Granulocytes: 0.02 K/uL (ref 0.00–0.07)
Basophils Absolute: 0.1 K/uL (ref 0.0–0.1)
Basophils Relative: 1 %
Eosinophils Absolute: 0.1 K/uL (ref 0.0–0.5)
Eosinophils Relative: 2 %
HCT: 41.6 % (ref 36.0–46.0)
Hemoglobin: 13.4 g/dL (ref 12.0–15.0)
Immature Granulocytes: 0 %
Lymphocytes Relative: 22 %
Lymphs Abs: 1.7 K/uL (ref 0.7–4.0)
MCH: 30.2 pg (ref 26.0–34.0)
MCHC: 32.2 g/dL (ref 30.0–36.0)
MCV: 93.9 fL (ref 80.0–100.0)
Monocytes Absolute: 0.9 K/uL (ref 0.1–1.0)
Monocytes Relative: 12 %
Neutro Abs: 5 K/uL (ref 1.7–7.7)
Neutrophils Relative %: 63 %
Platelets: 214 K/uL (ref 150–400)
RBC: 4.43 MIL/uL (ref 3.87–5.11)
RDW: 12.9 % (ref 11.5–15.5)
WBC: 7.8 K/uL (ref 4.0–10.5)
nRBC: 0 % (ref 0.0–0.2)

## 2024-10-12 LAB — COMPREHENSIVE METABOLIC PANEL WITH GFR
ALT: 18 U/L (ref 0–44)
AST: 24 U/L (ref 15–41)
Albumin: 4.1 g/dL (ref 3.5–5.0)
Alkaline Phosphatase: 141 U/L — ABNORMAL HIGH (ref 38–126)
Anion gap: 8 (ref 5–15)
BUN: 14 mg/dL (ref 6–20)
CO2: 29 mmol/L (ref 22–32)
Calcium: 9.1 mg/dL (ref 8.9–10.3)
Chloride: 104 mmol/L (ref 98–111)
Creatinine, Ser: 0.64 mg/dL (ref 0.44–1.00)
GFR, Estimated: >60 mL/min (ref >60)
Glucose, Bld: 84 mg/dL (ref 70–99)
Potassium: 4 mmol/L (ref 3.5–5.1)
Sodium: 140 mmol/L (ref 135–145)
Total Bilirubin: 0.4 mg/dL (ref 0.0–1.2)
Total Protein: 7.1 g/dL (ref 6.5–8.1)

## 2024-10-12 LAB — MAGNESIUM: Magnesium: 2.1 mg/dL (ref 1.7–2.4)

## 2024-10-12 LAB — VITAMIN D 25 HYDROXY (VIT D DEFICIENCY, FRACTURES): Vit D, 25-Hydroxy: 41.24 ng/mL (ref 30–100)

## 2024-10-12 NOTE — Progress Notes (Signed)
 Lyl[facility] [employer] [facility] presented for Portacath access and flush with labs. Portacath located right chest wall accessed with  H 20 needle.  Good blood return present. Portacath flushed with 20ml NS and needle removed intact.  Procedure tolerated well and without incident.     Discharged from clinic ambulatory in stable condition. Alert and oriented x 3. F/U with [facility] as scheduled.

## 2024-10-15 ENCOUNTER — Inpatient Hospital Stay

## 2024-10-21 NOTE — Progress Notes (Unsigned)
 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  Extensive oncology history below Oncotype DX score: 14.  Considering patient had positive metastatic disease in the lymph node, patient received adjuvant chemotherapy with dose dense AC + weekly paclitaxel  for 12 cycles.  Also received adjuvant radiation Currently on anastrozole  daily   -Patient reports some hot flashes and joint pains from anastrozole  but overall tolerating okay.  Continue anastrozole  1 mg daily - Last mammogram from 07/10/2024 was negative for carcinoma.  Repeat scheduled in February for assessment of the stable 1.2 cm mass in the retroareolar location of the left breast. - Last DEXA scan from 01/04/2023 was normal.  Will repeat in 2 years that is 12/2024. Ordered today - Continue calcium and vitamin D    Return to clinic in 3 months for physical exam and further evaluation  Arthralgia, unspecified joint Joint pain worsening at night, partially relieved by gabapentin . Concerns about gabapentin  and dementia due to family history.   - Continue gabapentin   twice daily dosing. - Recommended ibuprofen  400-600 mg per dose, up to four times daily as needed. - Advised not to exceed four doses per day.  Hot flashes Hot flashes began prior to cancer diagnosis but are more or less stable at this time   - Continue to monitor at this time  Single subsegmental pulmonary embolism without acute cor  pulmonale Diagnosed on 08/08/2015 CT angiogram with bilateral pulmonary emboli, moderate embolus burden.  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 Heterozygosity for factor V Leiden mutation.  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  PT mutation negative.  Protein C, protein S, AT III normal.   - Continue Xarelto twice daily.  Restless leg syndrome Well-controlled restless leg syndrome at this time   -Continue Requip  twice daily   Fatigue Patient reports fatigue Had TSH checked with primary care but does not know the results yet.  - Will order iron panel with next blood draw   The patient understands the plans discussed today and is in agreement with them.  She knows to contact our office if she develops concerns prior to her next appointment.  The total time spent in the appointment was 17 minutes for the encounter with patient, including review of chart and various tests results, discussions about plan of care and coordination of care plan   Judy Dry, MD  [facility] [place] - A DEPT OF [facility] [address] [place] [place] [address] Dept: [phone] Dept Fax: [phone]   Orders Placed This Encounter  Procedures   DG Bone Density    Standing Status:   Future    Expected Date:   01/20/2025    Expiration Date:   10/22/2025    Reason for Exam (SYMPTOM  OR DIAGNOSIS REQUIRED):   Onestrogen blocker therapy    Is patient pregnant?:   No    Preferred imaging location?:   [facility]   CBC with Differential/Platelet  Standing Status:   Future    Expected Date:   01/14/2025    Expiration Date:   04/14/2025   Comprehensive metabolic panel with GFR    Standing Status:   Future    Expected Date:   01/14/2025    Expiration Date:   04/14/2025   Ferritin    Standing Status:   Future    Expected Date:   01/14/2025    Expiration Date:   04/14/2025   Iron and TIBC    Standing Status:   Future    Expected Date:   01/14/2025     Expiration Date:   04/14/2025     ONCOLOGY HISTORY:   I have reviewed her chart and materials related to her cancer extensively and collaborated history with the patient. 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  No pathologic left axillary lymphadenopathy.  BI-RADS: 5: Highly suggestive of malignancy. -12/16/2022: Breast, left, needle core biopsy:  - Invasive mammary carcinoma overall grade 2/3, no lymphovascular invasion was identified - ER: Positive, 100%, PR: Positive, 100%, Ki-67: 1%, HER2: Negative(0) -01/04/2023: Bone density plan: T-score: -0.3, normal. - 01/10/2023: Left breast, partial mastectomy:  - Invasive moderately differentiated ductal adenocarcinoma, grade 2.  Focal ductal carcinoma in situ, intermediate nuclear grade, cribriform type without necrosis.  - Margins free  - Left axillary sentinel lymph node: 1 1 lymph node with metastatic carcinoma and 1 lymph node with micrometastasis  -pT1c, pN41mi  - 01/20/2023: Oncotype Dx: RS score: 14.  Distant recurrence at 9 years with AI/TAM is 14%, no apparent benefit of chemotherapy -02/10/2023: PET scan: Postoperative findings in the left breast and left axilla, with doing good GU maximum SUV in the subcutaneous tissues of the left breast at the site of prior surgery, maximum SUV is 4.2. No specific indicators of active malignancy.The rim sclerotic lesion of concern in the left acetabulum demonstrates no accentuated metabolic activity and is likely an enchondroma or similar benign osseous lesion. -02/11/2023: Port placed -02/22/2023-07/05/2023: Adjuvant chemotherapy ddAC + weekly taxol  -08/02/2023: Left  breast radiation -08/17/2023-Current: Anastrozole  1mg  daily -12/20/2023: Diagnostic mammogram: Indeterminate 1.1 cm mass in the retroareolar left breast.  BI-RADS: 4: Suspicious - 01/03/2024: Breast, left retroareolar mass, needle core biopsy:  - Dense fibrotic tissue with extensive hemosiderin deposition with focal denuded ductal epithelium suggestive of a cyst/dilated duct and rare benign breast elements. (Benign) - 07/10/2024: Diagnostic mammogram: Stable 1.2 cm mass in the retroareolar location of the left breast, previously biopsied with pathology revealing dense stromal fibrosis.  BI-RADS: 3: Probably benign.  Current Treatment:  Anastrazole 1mg  daily  INTERVAL HISTORY:   Discussed the use of AI scribe software for clinical note transcription with the patient, who gave verbal consent to proceed.  History of Present Illness Judy Patton is a 53 year old female with stage I left breast cancer, status post surgery and lymph node dissection,radiation, presenting for oncology follow-up regarding persistent arthralgia and management of treatment-related side effects.  She continues adjuvant endocrine therapy with anastrozole  for stage I left breast cancer. She recently underwent a mammogram, with follow-up imaging of the left breast scheduled in February to evaluate fibrosis. She takes vitamin D  but has not been taking calcium supplements. She  continues Xarelto for prior pulmonary embolism.  Arthralgia remains her most severe symptom, described as gradually worsening and affecting multiple joints, including toes, fingers, ankles, knees, and shoulders. Gabapentin  dose escalation has not provided relief. She uses ibuprofen  occasionally for headaches but avoids regular use for joint pain due to concerns about side effects, despite normal renal function.  She experiences persistent hot flashes, characterized by a constant sensation of heat and occasional diaphoresis. She manages these symptoms with  environmental modifications, such as using a fan at work, and finds them bothersome but tolerable.  She notes occasional pain at the surgical scar and a cord-like sensation under the arm, which she attributes to prior lymph node dissection. She finds her breasts dense and has difficulty performing self-exams, never having detected the original lesion even after diagnosis.  Restless legs syndrome symptoms are unchanged and not worsening. She continues to take Requip  as prescribed. She also reports ongoing fatigue and some shortness of breath, which she attributes to weight gain and decreased physical activity. She works in a sedentary job and expresses concern about weight gain, though she typically has only one large meal per day and does not associate it with anastrozole  use.    I have reviewed the past medical history, past surgical history, social history and family history with the patient and they are unchanged from previous note.  ALLERGIES:  is allergic to hydrocodone-acetaminophen .  MEDICATIONS:  Current Outpatient Medications  Medication Sig Dispense Refill   acetaminophen  (TYLENOL ) 500 MG tablet Take 1,000 mg by mouth every 6 (six) hours as needed for moderate pain or headache.     anastrozole  (ARIMIDEX ) 1 MG tablet TAKE 1 TABLET BY MOUTH EVERY DAY 90 tablet 2   Cholecalciferol (VITAMIN D3) 50 MCG (2000 UT) CAPS Take 1 capsule by mouth daily.     gabapentin  (NEURONTIN ) 300 MG capsule Take 1 capsule (300 mg total) by mouth 2 (two) times daily. 60 capsule 2   ibuprofen  (ADVIL ,MOTRIN ) 200 MG tablet Take 400 mg by mouth every 6 (six) hours as needed for headache or moderate pain.     levothyroxine  (SYNTHROID , LEVOTHROID) 88 MCG tablet TAKE 88 MCG BY MOUTH IN THE MORNING 90 tablet 3   lidocaine -prilocaine  (EMLA ) cream Apply a quarter sized amount to port a cath site and cover with plastic wrap 1 hour prior to infusion appointments 30 g 3   loratadine (CLARITIN) 10 MG tablet Take 10 mg by  mouth daily.     LORazepam (ATIVAN) 0.5 MG tablet Take 0.25-0.5 mg by mouth 2 (two) times daily.     Polyethylene Glycol 400 (VISINE Patton EYE RELIEF OP) Place 1 drop into both eyes daily as needed (Patton eye).     rOPINIRole  (REQUIP ) 2 MG tablet TAKE ONE TABLET BY MOUTH AT BEDTIME 30 tablet 9   TRINTELLIX 20 MG TABS tablet Take 20 mg by mouth daily.     trolamine salicylate (ASPERCREME) 10 % cream Apply 1 application topically as needed for muscle pain.     XARELTO 20 MG TABS tablet   2   No current facility-administered medications for this visit.    VITALS:  Blood pressure 133/74, pulse 77, temperature 98.1 F (36.7 C), temperature source Oral, resp. rate 18, weight 206 lb 12.7 oz (93.8 kg), SpO2 100%.  Wt Readings from Last 3 Encounters:  10/22/24 206 lb 12.7 oz (93.8 kg)  07/23/24 198 lb (89.8 kg)  03/06/24 201 lb (91.2 kg)    Body mass index is 34.41 kg/m.  Performance status (ECOG): 0 - Asymptomatic  PHYSICAL EXAM:   GENERAL:alert, no distress and comfortable SKIN: skin color, texture, turgor are normal, no rashes or significant lesions BREAST: Right: Normal breast exam, no masses palpated.  Left breast exam: Surgical scar palpated in the upper outer quadrant.  No other masses palpated. LYMPH:  no palpable lymphadenopathy in the cervical, axillary or inguinal LUNGS: clear to auscultation and percussion with normal breathing effort HEART: regular rate & rhythm and no murmurs and no lower extremity edema ABDOMEN:abdomen soft, non-tender and normal bowel sounds Musculoskeletal:no cyanosis of digits and no clubbing  NEURO: alert & oriented x 3 with fluent speech  LABORATORY DATA:  I have reviewed the data as listed  Lab Results  Component Value Date   WBC 7.8 10/12/2024   NEUTROABS 5.0 10/12/2024   HGB 13.4 10/12/2024   HCT 41.6 10/12/2024   MCV 93.9 10/12/2024   PLT 214 10/12/2024      Chemistry      Component Value Date/Time   NA 140 10/12/2024 0807   NA 141  02/18/2015 1527   K 4.0 10/12/2024 0807   CL 104 10/12/2024 0807   CO2 29 10/12/2024 0807   BUN 14 10/12/2024 0807   BUN 9 02/18/2015 1527   CREATININE 0.64 10/12/2024 0807   CREATININE 0.53 12/26/2013 1434      Component Value Date/Time   CALCIUM 9.1 10/12/2024 0807   ALKPHOS 141 (H) 10/12/2024 0807   AST 24 10/12/2024 0807   ALT 18 10/12/2024 0807   BILITOT 0.4 10/12/2024 0807   BILITOT 0.2 02/18/2015 1527       RADIOGRAPHIC STUDIES: I have personally reviewed the radiological images as listed and agreed with the findings in the report.   "

## 2024-10-22 ENCOUNTER — Inpatient Hospital Stay: Attending: Hematology | Admitting: Oncology

## 2024-10-22 VITALS — BP 133/74 | HR 77 | Temp 98.1°F | Resp 18 | Wt 206.8 lb

## 2024-10-22 DIAGNOSIS — Z170 Estrogen receptor positive status [ER+]: Secondary | ICD-10-CM | POA: Diagnosis not present

## 2024-10-22 DIAGNOSIS — I2693 Single subsegmental pulmonary embolism without acute cor pulmonale: Secondary | ICD-10-CM | POA: Diagnosis not present

## 2024-10-22 DIAGNOSIS — C50412 Malignant neoplasm of upper-outer quadrant of left female breast: Secondary | ICD-10-CM

## 2024-10-22 DIAGNOSIS — M2550 Pain in unspecified joint: Secondary | ICD-10-CM

## 2024-10-22 DIAGNOSIS — G2581 Restless legs syndrome: Secondary | ICD-10-CM | POA: Diagnosis not present

## 2024-10-22 DIAGNOSIS — R232 Flushing: Secondary | ICD-10-CM

## 2024-10-23 ENCOUNTER — Other Ambulatory Visit: Payer: Self-pay

## 2024-10-29 ENCOUNTER — Encounter: Payer: Self-pay | Admitting: *Deleted

## 2024-11-05 DIAGNOSIS — N6320 Unspecified lump in the left breast, unspecified quadrant: Secondary | ICD-10-CM

## 2024-12-25 ENCOUNTER — Other Ambulatory Visit (HOSPITAL_COMMUNITY)

## 2024-12-25 ENCOUNTER — Encounter (HOSPITAL_COMMUNITY)

## 2025-01-07 ENCOUNTER — Inpatient Hospital Stay: Attending: Hematology

## 2025-01-07 ENCOUNTER — Other Ambulatory Visit (HOSPITAL_COMMUNITY)

## 2025-01-14 ENCOUNTER — Inpatient Hospital Stay: Admitting: Oncology
